# Patient Record
Sex: Female | Born: 1955
Health system: Southern US, Community
[De-identification: ages and names within clinical notes are randomized; demographics above are authoritative.]

## PROBLEM LIST (undated history)

## (undated) DIAGNOSIS — Z8719 Personal history of other diseases of the digestive system: Secondary | ICD-10-CM

## (undated) DIAGNOSIS — M8580 Other specified disorders of bone density and structure, unspecified site: Secondary | ICD-10-CM

## (undated) DIAGNOSIS — N200 Calculus of kidney: Secondary | ICD-10-CM

## (undated) DIAGNOSIS — R9089 Other abnormal findings on diagnostic imaging of central nervous system: Secondary | ICD-10-CM

## (undated) DIAGNOSIS — R599 Enlarged lymph nodes, unspecified: Secondary | ICD-10-CM

## (undated) DIAGNOSIS — R413 Other amnesia: Principal | ICD-10-CM

## (undated) DIAGNOSIS — S5292XA Unspecified fracture of left forearm, initial encounter for closed fracture: Secondary | ICD-10-CM

## (undated) DIAGNOSIS — K219 Gastro-esophageal reflux disease without esophagitis: Secondary | ICD-10-CM

## (undated) DIAGNOSIS — E785 Hyperlipidemia, unspecified: Secondary | ICD-10-CM

## (undated) DIAGNOSIS — K589 Irritable bowel syndrome without diarrhea: Secondary | ICD-10-CM

## (undated) DIAGNOSIS — T7840XA Allergy, unspecified, initial encounter: Secondary | ICD-10-CM

## (undated) DIAGNOSIS — B009 Herpesviral infection, unspecified: Secondary | ICD-10-CM

## (undated) DIAGNOSIS — N159 Renal tubulo-interstitial disease, unspecified: Secondary | ICD-10-CM

## (undated) DIAGNOSIS — Z8669 Personal history of other diseases of the nervous system and sense organs: Secondary | ICD-10-CM

## (undated) DIAGNOSIS — G4730 Sleep apnea, unspecified: Secondary | ICD-10-CM

## (undated) DIAGNOSIS — G8929 Other chronic pain: Secondary | ICD-10-CM

## (undated) DIAGNOSIS — D509 Iron deficiency anemia, unspecified: Secondary | ICD-10-CM

## (undated) DIAGNOSIS — M797 Fibromyalgia: Secondary | ICD-10-CM

## (undated) DIAGNOSIS — M549 Dorsalgia, unspecified: Secondary | ICD-10-CM

## (undated) DIAGNOSIS — I2510 Atherosclerotic heart disease of native coronary artery without angina pectoris: Secondary | ICD-10-CM

## (undated) DIAGNOSIS — A498 Other bacterial infections of unspecified site: Secondary | ICD-10-CM

## (undated) DIAGNOSIS — D126 Benign neoplasm of colon, unspecified: Secondary | ICD-10-CM

## (undated) DIAGNOSIS — M5481 Occipital neuralgia: Secondary | ICD-10-CM

## (undated) DIAGNOSIS — K5790 Diverticulosis of intestine, part unspecified, without perforation or abscess without bleeding: Secondary | ICD-10-CM

## (undated) HISTORY — PX: CYSTECTOMY: SHX5119

## (undated) HISTORY — PX: DILATION AND CURETTAGE OF UTERUS: SHX78

## (undated) HISTORY — PX: OTHER SURGICAL HISTORY: SHX169

## (undated) HISTORY — DX: Other specified disorders of bone density and structure, unspecified site: M85.80

## (undated) HISTORY — DX: Hyperlipidemia, unspecified: E78.5

## (undated) HISTORY — DX: Irritable bowel syndrome, unspecified: K58.9

## (undated) HISTORY — DX: Other bacterial infections of unspecified site: A49.8

## (undated) HISTORY — DX: Enlarged lymph nodes, unspecified: R59.9

## (undated) HISTORY — DX: Personal history of other diseases of the nervous system and sense organs: Z86.69

## (undated) HISTORY — PX: CHOLECYSTECTOMY: SHX55

## (undated) HISTORY — DX: Personal history of other diseases of the digestive system: Z87.19

## (undated) HISTORY — PX: COLONOSCOPY: SHX174

## (undated) HISTORY — DX: Benign neoplasm of colon, unspecified: D12.6

## (undated) HISTORY — DX: Other abnormal findings on diagnostic imaging of central nervous system: R90.89

## (undated) HISTORY — DX: Diverticulosis of intestine, part unspecified, without perforation or abscess without bleeding: K57.90

## (undated) HISTORY — DX: Atherosclerotic heart disease of native coronary artery without angina pectoris: I25.10

## (undated) HISTORY — DX: Sleep apnea, unspecified: G47.30

## (undated) HISTORY — DX: Allergy, unspecified, initial encounter: T78.40XA

## (undated) HISTORY — PX: POLYPECTOMY: SHX149

## (undated) HISTORY — DX: Iron deficiency anemia, unspecified: D50.9

## (undated) HISTORY — DX: Gastro-esophageal reflux disease without esophagitis: K21.9

## (undated) HISTORY — DX: Unspecified fracture of left forearm, initial encounter for closed fracture: S52.92XA

## (undated) HISTORY — PX: TONSILLECTOMY: SUR1361

## (undated) HISTORY — PX: SHOULDER SURGERY: SHX246

## (undated) HISTORY — DX: Other amnesia: R41.3

---

## 1998-06-29 ENCOUNTER — Ambulatory Visit (HOSPITAL_COMMUNITY): Admission: RE | Admit: 1998-06-29 | Discharge: 1998-06-30 | Payer: Self-pay | Admitting: Surgery

## 1998-06-29 ENCOUNTER — Encounter: Payer: Self-pay | Admitting: Surgery

## 1999-04-27 ENCOUNTER — Other Ambulatory Visit: Admission: RE | Admit: 1999-04-27 | Discharge: 1999-04-27 | Payer: Self-pay | Admitting: Gynecology

## 1999-04-27 ENCOUNTER — Encounter (INDEPENDENT_AMBULATORY_CARE_PROVIDER_SITE_OTHER): Payer: Self-pay | Admitting: Specialist

## 2001-06-16 ENCOUNTER — Other Ambulatory Visit: Admission: RE | Admit: 2001-06-16 | Discharge: 2001-06-16 | Payer: Self-pay | Admitting: Gynecology

## 2004-05-20 DIAGNOSIS — D126 Benign neoplasm of colon, unspecified: Secondary | ICD-10-CM

## 2004-05-20 HISTORY — DX: Benign neoplasm of colon, unspecified: D12.6

## 2006-08-17 ENCOUNTER — Observation Stay (HOSPITAL_COMMUNITY): Admission: EM | Admit: 2006-08-17 | Discharge: 2006-08-18 | Payer: Self-pay | Admitting: Emergency Medicine

## 2006-08-17 ENCOUNTER — Ambulatory Visit: Payer: Self-pay | Admitting: Cardiology

## 2006-08-29 ENCOUNTER — Ambulatory Visit: Payer: Self-pay

## 2006-08-29 ENCOUNTER — Encounter: Payer: Self-pay | Admitting: Internal Medicine

## 2006-09-22 ENCOUNTER — Ambulatory Visit: Payer: Self-pay | Admitting: Cardiology

## 2007-02-26 ENCOUNTER — Emergency Department (HOSPITAL_COMMUNITY): Admission: EM | Admit: 2007-02-26 | Discharge: 2007-02-26 | Payer: Self-pay | Admitting: Emergency Medicine

## 2010-10-05 NOTE — H&P (Signed)
Amanda Waters, Amanda Waters NO.:  [id number]   MEDICAL RECORD NO.:  [id number]          PATIENT TYPE:  OBV   LOCATION:  1833                         FACILITY:  [facility]   PHYSICIAN:  Rollene Rotunda, MD, FACCDATE OF BIRTH:  [birth year]   DATE OF ADMISSION:  08/17/2006  DATE OF DISCHARGE:                              HISTORY & PHYSICAL   CURRENT MEDICATIONS:  1. Ambien 10 mg 1/2 tablet q.h.s.  2. Famciclovir 5 mg daily.  3. Lidoderm patch to her back p.r.n.  4. Cyclobenzaprine p.r.n.  5. Nexium 40 mg daily p.r.n.  6. Zyrtec-D q. p.r.n.  7. Gabapentin 600 mg tablets 1-1/2 tablet to 8:00 a.m., 1 tablet at      2:00 p.m. 1 tablet 7:00 p.m. and 1 tablet at 11:00 p.m.  8. Hydrocodone and Advair Liqui-gels which she takes p.r.n., but      rarely although she has recently taken the Liqui-gels, because she      was walking around at a trade show recently.   SOCIAL HISTORY:  She lives in [place] with her husband and is self-  employed as a Network engineer.  She has no history of alcohol, tobacco  or drug abuse.   FAMILY HISTORY:  Her mother is alive at age 4 without a history of  coronary artery disease.  Her father is alive at age 104 with a history  of CVA and coronary artery disease, and she thinks he was diagnosed with  coronary artery disease at approximately age 54.  She has no siblings  with coronary artery disease.   REVIEW OF SYSTEMS:  She had a slight amount of diaphoresis with chest  pain yesterday but none today and rarely gets night sweats.  She has  vision loss and wears glasses.  The chest pain is described above.  She  denies symptoms of depression or anxiety.  She has chronic pain issues  in her back and in the occipital region of her head.  She has occasional  numbness in the outer aspect of her left arm and last two fingers.  Her  reflux symptoms were worse, and she was taking Nexium daily, but then  they improved once she took less Advil, and now she  does not take Nexium  on a daily basis.  She has alternating constipation and diarrhea.  Review of systems is otherwise negative.   PHYSICAL EXAM:  VITAL SIGNS: Temperature is 97.8, blood pressure  initially 155/89, now 122/68, pulse 73, respiratory rate 20, O2  saturation 100% on room air.  GENERAL:  She is a well-developed, well-nourished white female in no  acute distress.  HEENT:  Head is normocephalic and atraumatic with extraocular movements  intact.  Sclera clear.  Nares without discharge.  NECK:  There is no lymphadenopathy, thyromegaly, bruit or JVD noted.  CVA:  Heart is regular in rate and rhythm with an S1-S2 and distal  pulses, are equal in all four extremities.  No femoral bruits are  appreciated, and there is no significant murmur, rub or gallop noted on  heart sounds.  Her PMI is normal.  LUNGS:  Clear to auscultation bilaterally without wheezing or crackles.  SKIN:  No rashes or lesions are noted.  ABDOMEN:  She is tender in the mid epigastric area and in both lower  quadrants.  Abdomen is soft and she has active bowel sounds, and there  is no rebound or guarding.  She has no splenomegaly by palpation.  EXTREMITIES:  There is no cyanosis, clubbing or edema.  MUSCULOSKELETAL:  There is no joint deformity or effusions and no spine  or CVA tenderness.  NEURO:  She is alert and oriented.  Cranial nerves II-XII grossly  intact.  CHEST X-RAY:  There is no acute disease.  EKG:  Sinus rhythm rate 72  with no acute ischemic changes and no old is available for comparison.   LABORATORY VALUES:  Hemoglobin 14.6, hematocrit 43, sodium 140,  potassium 3.9, chloride 107, BUN 10, creatinine 1.0, glucose 91, other  CMET values within normal limits, D-dimer less than 0.22.  Point care  markers negative x one.   IMPRESSION:  1. Chest pain.  She has some typical but mostly atypical      characteristics.  The substernal pain is described as a heaviness,      and she has left arm  numbness, but on further evaluation this is      more consistent with neurologic symptoms.  She has had constant      chest pain for greater than 24 hours with no objective evidence for      ischemia.  We will therefore admit her to the hospital overnight      for observation and rule out MI.  If her cardiac enzymes are      negative, she will be scheduled for an outpatient exercise Myoview      in our office.  We will continue to use morphine for pain on a      p.r.n. basis, but she have hydrocodone at home that she can take as      an outpatient, should she continue to need anything.      Theodore Demark, PA-C      Rollene Rotunda, MD, [facility]  Electronically Signed    RB/MEDQ  D:  08/17/2006  T:  08/17/2006  Job:  [id number]

## 2010-10-05 NOTE — Discharge Summary (Signed)
NAME:  Amanda Waters, Amanda Waters NO.:  [id number]   MEDICAL RECORD NO.:  [id number]          PATIENT TYPE:  OBV   LOCATION:  3710                         FACILITY:  [facility]   PHYSICIAN:  Bevelyn Buckles. Bensimhon, MDDATE OF BIRTH:  Apr 15, 1956   DATE OF ADMISSION:  08/17/2006  DATE OF DISCHARGE:  08/18/2006                               DISCHARGE SUMMARY   PRIMARY CARE PHYSICIAN:  Jenne Campus, M.D., [facility].   CARDIOLOGIST:  Rollene Rotunda, MD, [facility]   DISCHARGE DIAGNOSES:  1. Chest pain, negative cardiac workup this admission by cardiac      markers.  CT of the chest negative for pulmonary embolism.  2. Abdominal pain, pending final results of abdominal ultrasound. The      patient with lipase, amylase within normal limits.  Hepatic      functions unremarkable except for indirect bilirubin mildly      elevated at 1.0.   PAST MEDICAL HISTORY:  1. Mild borderline diabetes managed by diet.  2. Hyperlipidemia managed by diet.  3. Perimenopausal  4. Herpes simplex 1 and 2.  5. Fibromyalgia  6. History of occipital nerve damage requiring medication, supposedly      obtained through a massage.  7. History of GERD and irritable bowel syndrome.  8. 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  1. Right elbow repair.  2. Cholecystectomy.  3. Removal of a fatty tumor from her left shoulder.  4. Colonoscopy was EGD  5. Allergies to DARVOCET, CELEBREX, PREDNISONE.   HOSPITAL COURSE:  Amanda Waters is a 55 year old Caucasian female with no a  history of coronary artery disease.  Apparently was driving yesterday  and developed chest pressure.  She described as substernal rating at a 7  on a scale of 1-10.  It was associated with nausea, malaise, and slight  diaphoresis as well as dizziness.  The patient denies shortness of  breath. Apparently also experienced some abdominal cramping with  diarrhea. The patient eventually returned  home, continued to feel bad,  took Nexium, became nauseated, continued to feel bad, took Ambien for  sleep, eventually fell asleep. On the morning of admission, symptoms  continued.  She came to the emergency room for further evaluation. In  the ER she was given baby aspirin and developed epigastric symptoms.  She then received morphine and GI cocktail which eventually relieved her  chest discomfort.   On physical exam, blood pressure 155/89, dropped to 122/68 after pain  relieved. Heart rate 73.  The patient saturating 100% on room air. Chest  x-ray showed no acute disease.  EKG:  Sinus rhythm at a rate of 72  without acute ST or T wave changes. Baseline lab work within normal  limits.  Point-of-care cardiac markers negative. The patient's chest  pain was felt to be mostly atypical. She was admitted for observation.  Dr. Gala Romney in to see the patient on day of discharge. The patient  complained of abdominal discomfort; however, amylase, lipase, LFTs  essentially negative. The patient states she has a gastroenterologist in  [place]. We will plan on doing abdominal ultrasound if ultrasound of  abdomen is negative. The patient to be discharged home to follow up with  her gastroenterologist in [facility]. The patient to schedule that  appointment. Also to follow up with Dr. Antoine Poche.  Arranged for her to  have a stress test exercise Myoview (April 11).  Will have the patient  proceed with 2-D echocardiogram first on that same day and then the  stress Myoview and then follow up with Dr. Antoine Poche on April 16.  I  believe the appointment has already been arranged. The patient  instructed to continue her home medications including Famvir, Neurontin,  hydrocodone that she was previously taking, Ambien for sleep which she  was previously on, laxative which she was previously on, aspirin 81 mg  daily and her Protonix which she takes 80 mg b.i.d. At time of  discharge, patient afebrile, pulse  88, respirations 18, blood pressure  108/73, saturating 99% on room air.  Weight 158 pounds. Ultrasound of  the abdomen showing status post cholecystectomy, otherwise no acute  findings.   Duration of discharge encounter:  45 minutes.      Dorian Pod, ACNP      Bevelyn Buckles. Bensimhon, MD  Electronically Signed    MB/MEDQ  D:  08/18/2006  T:  08/18/2006  Job:  [id number]   cc:   Jenne Campus, M.D., [facility]

## 2010-10-05 NOTE — H&P (Signed)
NAME:  Amanda Waters, Amanda Waters NO.:  [id number]   MEDICAL RECORD NO.:  [id number]          PATIENT TYPE:  EMS   LOCATION:  MAJO                         FACILITY:  [facility]   PHYSICIAN:  Rollene Rotunda, MD, FACCDATE OF BIRTH:  06/15/1955   DATE OF ADMISSION:  08/17/2006  DATE OF DISCHARGE:                              HISTORY & PHYSICAL   PRIMARY CARE PHYSICIAN:  Dr. Jenne Campus, [place].   CARDIOLOGIST:  New and will be Rollene Rotunda, MD, [facility].   CHIEF COMPLAINT:  Chest pain.   HISTORY OF PRESENT ILLNESS:  Amanda Waters is a 55 year old white female  with no history of coronary artery disease.  She was driving yesterday  and developed chest pressure at approximately 1:30.  It was substernal  and reached a 7/10.  It was associated with nausea, general malaise, and  slight diaphoresis as well as a little dizziness.  She had no shortness  of breath or nausea.  She has never had these symptoms before.  She  continued driving but stopped for a while, and while she stopped for  about an hour, she had two episodes of abdominal cramping one episode of  diarrhea.  Additionally, she was having associated numbness to the outer  aspect of her left arm and her fourth and fifth fingers.  She has had  that sometimes upon waking.   Last night after she reached home, her symptoms had not resolved, so she  took a Nexium.  She felt nauseated and did not feel that she could eat  anything without vomiting, so she did not.  She did not try any other  medications.  She took Ambien as usual for sleep, and about 2 hours  after taking the Nexium and the Ambien, she did sleep.  This morning her  symptoms had not resolved, and she came to the emergency room.  She  received four baby aspirin and developed epigastric symptoms.  She got  morphine and a GI cocktail and then the second dose of morphine.  After  the second dose of morphine, her chest pain eased and is currently a 3  or 4/10.  She is not  sure which medication helped her more but feels  that the more morphine helped the numbness in her left arm more.   PAST MEDICAL HISTORY:  She was told by her family physician and she was  prediabetic and is trying to limit sugars in her diet.  She denies any  history of hypertension but states that she does have a history of  hyperlipidemia which she is also trying to control with diet.  She has a  family history of coronary artery disease in her father.  She had an  episode of chest pain that was different from the chest pain she had  yesterday and today in approximately 2000, and for that she had an  exercise treadmill stress test in [facility] which she reports was okay.  She carries a diagnosis of fibromyalgia.  She is perimenopausal.  She  has herpes simplex 1 and 2.  Because of fibromyalgia, she has  gotten  massages in the past and, because of the massages, she has gotten  occipital nerve damage for which she takes medication.  She has a  history of gastroesophageal reflux disease and irritable bowel syndrome.  She has chronic back pain after coccygeal fracture x2.  She also states  that she has osteoarthritis.   SURGICAL HISTORY:  She is status post ovarian cyst removal as well as  right elbow repair after a fall with fractures as well as  cholecystectomy, removal of a fatty tumor from her left shoulder, and  colonoscopy with EGD.   ALLERGIES:  She is allergic or intolerant to DARVOCET, CELEBREX DIRECT  and PREDNISONE.   DICTATION ENDS HERE.      Theodore Demark, PA-C      Rollene Rotunda, MD, [facility]  Electronically Signed    RB/MEDQ  D:  08/17/2006  T:  08/17/2006  Job:  [id number]   cc:   Dr. Jenne Campus, [facility]

## 2010-10-05 NOTE — Assessment & Plan Note (Signed)
[facility]                            CARDIOLOGY OFFICE NOTE   NAME:Amanda Waters, Amanda Waters                      MRN:          [id number]  DATE:09/22/2006                            DOB:          May 07, 1956    PRIMARY CARE PHYSICIAN:  Dr. Fredia Beets, [place].   REASON FOR PRESENTATION:  The patient with chest pain.   HISTORY OF PRESENT ILLNESS:  The patient was hospitalized on August 17, 2006 with chest discomfort.  She ruled out for myocardial infarction.  She had a negative abdominal ultrasound at that time as well.  Following  discharge, she had a stress perfusion study in our office which  demonstrated an EF of 68% with normal wall motion.  She had an  echocardiogram which demonstrated an EF of 70-75% with no valvular  abnormalities or wall motion abnormalities.  Since that time, she has  had no further chest pressure such as that which caused her  hospitalization.  She has had some tingling in her hand which was one of  her complaints on admission, too.  She notices this in her fourth and  fifth fingers on her left hand.  She has had no neck discomfort.  She  has had no new shortness of breath.  She denies any PND or orthopnea.  She has had no palpitations, presyncope, or syncope.  She gets a little  dizzy, particularly when she is climbing a flight of stairs or deep  breathing.   She did have her lipid measured, and she had a total of triglycerides  246, total cholesterol 238, LDL 152, and an HDL of 47.   PAST MEDICAL HISTORY:  1. Borderline hyperglycemia.  2. Dyslipidemia.  3. Herpes simplex 1 and 2.  4. Fibromyalgia.  5. Occipital nerve damage.  6. Gastroesophageal reflux disease.  7. Irritable bowel syndrome.  8. 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  9. Osteoarthritis.  10.Ovarian cyst resected.  11.Right elbow repair.  12.Cholecystectomy.  13.Fatty tumor removed from the left shoulder.   ALLERGIES:  DARVOCET, CELEBREX,  PREDNISONE.   MEDICATIONS:  1. Neurontin 2400 mg daily.  2. Famciclovir daily.  3. Lidoderm patch.  4. Zyrtec.  5. DHEA 5 mg daily.  6. Vitamin D.  7. Nexium.   REVIEW OF SYSTEMS:  As stated in the HPI, otherwise negative for other  systems.   PHYSICAL EXAMINATION:  GENERAL:  The patient is in no distress.  VITAL SIGNS:  Blood pressure 110/78, heart rate 86 and regular.  HEENT:  Eyes are normal.  Pupils equal, round, and reactive to light.  Fundi not visualized.  Oral mucosa unremarkable.  NECK:  No jugular venous distention at 45 degrees, carotid upstroke  brisk and symmetric, no bruits, no thyromegaly.  LYMPHATICS:  No cervical, maxillary, or inguinal adenopathy.  LUNGS:  Clear to auscultation bilaterally.  BACK:  No costovertebral angle tenderness.  CHEST:  Unremarkable.  HEART:  PMI not displaced or sustained.  S1 and S2 within normal limits.  No S3, no S4, no clicks, rubs, murmurs.  ABDOMEN:  Flat, positive bowel sounds, normal  in frequency and pitch.  No bruits, rebound, guarding.  No midline pulse, no mass, no  hepatomegaly or splenomegaly.  SKIN:  No rashes, no nodules.  EXTREMITIES:  2+ pulses throughout, no edema, cyanosis, clubbing.  NEUROLOGIC:  Oriented to person, place, and time.  Cranial nerves 2-12  grossly intact.  Motor grossly intact.   EKG:  Sinus rhythm, rate 86.  Axis within normal limits.  Intervals  within normal limits.  No acute ST-T wave changes.   ASSESSMENT/PLAN:  1. Chest discomfort.  The patient's chest discomfort has not recurred.      She had a negative workup including echocardiogram and Cardiolite.      At this point, there is no evidence of cardiac etiology.  No      further cardiovascular testing is suggested.  2. Dizziness.  The patient has some dizziness.  There seems to be with      deep breathing or activity and may be related to hyperventilation.      She does not have any classic orthostatic symptoms.  She has not      had any  presyncope or syncope.  No further cardiac workup is      suggested.  3. Dyslipidemia.  I did counsel her on diet and recommended the [facility] approach as she has significant dyslipidemia.  I would      suggest that if she can follow a diet and has this repeated and she      still is not improved, that I would have a low threshold for a      statin.  She will discuss this with her primary care doctor.  4. Arm discomfort.  She will discuss this with her fibromyalgia      doctor.  She also sees a      headache/pain specialist.  5. Followup will be in this clinic as needed.     Rollene Rotunda, MD, [facility]     JH/MedQ  DD: 09/22/2006  DT: 09/22/2006  Job #: [id number]   cc:   Ronny Flurry, MD                             [facility]                            CARDIOLOGY OFFICE NOTE   NAME:Amanda Waters, Amanda Waters                      MRN:          [id number]  DATE:09/22/2006                            DOB:          August 30, 1955    PRIMARY CARE PHYSICIAN:  Dr. Fredia Beets, [place].   REASON FOR PRESENTATION:  The patient with chest pain.   HISTORY OF PRESENT ILLNESS:  The patient was hospitalized on August 17, 2006 with chest discomfort.  She ruled out for myocardial infarction.  She had a negative abdominal ultrasound at that time as well.  Following  discharge, she had a stress perfusion study in our office which  demonstrated an EF of 68% with normal wall motion.  She had an  echocardiogram which demonstrated an EF of 70-75% with no  valvular  abnormalities or wall motion abnormalities.  Since that time, she has  had no further chest pressure such as that which caused her  hospitalization.  She has had some tingling in her hand which was one of  her complaints on admission, too.  She notices this in her fourth and  fifth fingers on her left hand.  She has had no neck discomfort.  She  has had no new shortness of breath.  She denies any PND or orthopnea.  She has had no  palpitations, presyncope, or syncope.  She gets a little  dizzy, particularly when she is climbing a flight of stairs or deep  breathing.   She did have her lipid measured, and she had a total of triglycerides  246, total cholesterol 238, LDL 152, and an HDL of 47.   PAST MEDICAL HISTORY:  1. Borderline hyperglycemia.  2. Dyslipidemia.  3. Herpes simplex 1 and 2.  4. Fibromyalgia.  5. Occipital nerve damage.  6. Gastroesophageal reflux disease.  7. Irritable bowel syndrome.  8. 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  9. Osteoarthritis.  10.Ovarian cyst resected.  11.Right elbow repair.  12.Cholecystectomy.  13.Fatty tumor removed from the left shoulder.   ALLERGIES:  DARVOCET, CELEBREX, PREDNISONE.   MEDICATIONS:  1. Neurontin 2400 mg daily.  2. Famciclovir daily.  3. Lidoderm patch.  4. Zyrtec.  5. DHEA 5 mg daily.  6. Vitamin D.  7. Nexium.   REVIEW OF SYSTEMS:  As stated in the HPI, otherwise negative for other  systems.

## 2011-02-28 LAB — URINALYSIS, ROUTINE W REFLEX MICROSCOPIC
Bilirubin Urine: NEGATIVE
Nitrite: NEGATIVE
Specific Gravity, Urine: 1.008
Urobilinogen, UA: 0.2
pH: 6

## 2011-02-28 LAB — I-STAT 8, (EC8 V) (CONVERTED LAB)
Bicarbonate: 25.4 — ABNORMAL HIGH
Chloride: 107
HCT: 41
Hemoglobin: 13.9
Operator id: 198171
TCO2: 27
pCO2, Ven: 43.2 — ABNORMAL LOW
pH, Ven: 7.377 — ABNORMAL HIGH

## 2011-02-28 LAB — POCT I-STAT CREATININE
Creatinine, Ser: 0.9
Operator id: 198171

## 2011-11-24 ENCOUNTER — Encounter (HOSPITAL_BASED_OUTPATIENT_CLINIC_OR_DEPARTMENT_OTHER): Payer: Self-pay | Admitting: Emergency Medicine

## 2011-11-24 ENCOUNTER — Emergency Department (HOSPITAL_BASED_OUTPATIENT_CLINIC_OR_DEPARTMENT_OTHER)
Admission: EM | Admit: 2011-11-24 | Discharge: 2011-11-24 | Disposition: A | Discharge status: Home / Self Care | Payer: BC Managed Care – PPO | Attending: Emergency Medicine | Admitting: Emergency Medicine

## 2011-11-24 DIAGNOSIS — L03012 Cellulitis of left finger: Secondary | ICD-10-CM

## 2011-11-24 DIAGNOSIS — Z87442 Personal history of urinary calculi: Secondary | ICD-10-CM | POA: Insufficient documentation

## 2011-11-24 DIAGNOSIS — Z79899 Other long term (current) drug therapy: Secondary | ICD-10-CM | POA: Insufficient documentation

## 2011-11-24 DIAGNOSIS — L03019 Cellulitis of unspecified finger: Secondary | ICD-10-CM | POA: Insufficient documentation

## 2011-11-24 DIAGNOSIS — IMO0001 Reserved for inherently not codable concepts without codable children: Secondary | ICD-10-CM | POA: Insufficient documentation

## 2011-11-24 HISTORY — DX: Calculus of kidney: N20.0

## 2011-11-24 HISTORY — DX: Occipital neuralgia: M54.81

## 2011-11-24 HISTORY — DX: Dorsalgia, unspecified: M54.9

## 2011-11-24 HISTORY — DX: Other chronic pain: G89.29

## 2011-11-24 HISTORY — DX: Herpesviral infection, unspecified: B00.9

## 2011-11-24 HISTORY — DX: Renal tubulo-interstitial disease, unspecified: N15.9

## 2011-11-24 HISTORY — DX: Fibromyalgia: M79.7

## 2011-11-24 MED ORDER — HYDROCODONE-ACETAMINOPHEN 5-500 MG PO TABS: 1.0000 | ORAL_TABLET | ORAL | Status: AC | PRN

## 2011-11-24 MED ORDER — ONDANSETRON 4 MG PO TBDP
ORAL_TABLET | ORAL | Status: AC
  Administered 2011-11-24: 4 mg via ORAL
  Filled 2011-11-24: qty 1

## 2011-11-24 MED ORDER — LIDOCAINE HCL 2 % IJ SOLN
INTRAMUSCULAR | Status: AC
  Filled 2011-11-24: qty 1

## 2011-11-24 MED ORDER — ONDANSETRON 4 MG PO TBDP: 4.0000 mg | ORAL_TABLET | Freq: Three times a day (TID) | ORAL | Status: AC | PRN

## 2011-11-24 MED ORDER — ONDANSETRON 4 MG PO TBDP
4.0000 mg | ORAL_TABLET | Freq: Once | ORAL | Status: AC
  Administered 2011-11-24: 4 mg via ORAL

## 2011-11-24 MED ORDER — ONDANSETRON 4 MG PO TBDP
4.0000 mg | ORAL_TABLET | Freq: Once | ORAL | Status: AC
  Administered 2011-11-24: 4 mg via ORAL
  Filled 2011-11-24: qty 1

## 2011-11-24 MED ORDER — CLINDAMYCIN HCL 300 MG PO CAPS: 300.0000 mg | ORAL_CAPSULE | Freq: Four times a day (QID) | ORAL | Status: AC

## 2011-11-24 NOTE — ED Provider Notes (Signed)
aHistory     CSN: [id number]  Arrival date & time 11/24/11  5621   First MD Initiated Contact with Patient 11/24/11 [phone]      Chief Complaint  Patient presents with  . Nail Problem    (Consider location/radiation/quality/duration/timing/severity/associated sxs/prior treatment) HPI  Left thumb pain x 2 days ago. Noticed it while cooking. Worsening now with dec ROM of thumb. No radiation, denies hand pain. Denies fever/chills. Has taken 1/2 hydrocodone today with min relief. Saw pus drainage yesterday from corner of thumb. No h/o similar. Denies fever/chills. No h/o DM  Past Medical History  Diagnosis Date  . Fibromyalgia   . Occipital neuralgia   . Chronic back pain   . HSV-1 (herpes simplex virus 1) infection   . HSV-2 (herpes simplex virus 2) infection   . Kidney infection   . Kidney stones     Past Surgical History  Procedure Date  . Tonsillectomy   . Cystectomy   . Cholecystectomy   . Dilation and curettage of uterus     No family history on file.  History  Substance Use Topics  . Smoking status: Not on file  . Smokeless tobacco: Not on file  . Alcohol Use:     OB History    Grav Para Term Preterm Abortions TAB SAB Ect Mult Living                  Review of Systems except as noted HPI   Allergies  Celebrex; Darvon; Duract; Oxycodone; and Prednisone  Home Medications   Current Outpatient Rx  Name Route Sig Dispense Refill  . CYCLOBENZAPRINE HCL 10 MG PO TABS Oral Take 10 mg by mouth 3 (three) times daily as needed.    Marland Kitchen DICLOFENAC EPOLAMINE 1.3 % TD PTCH Transdermal Place 1 patch onto the skin daily as needed.    Marland Kitchen FAMCICLOVIR 500 MG PO TABS Oral Take 500 mg by mouth daily.    Marland Kitchen GABAPENTIN 600 MG PO TABS Oral Take 600 mg by mouth 3 (three) times daily.    Marland Kitchen LORATADINE 10 MG PO TABS Oral Take 10 mg by mouth daily.    Marland Kitchen NAPROXEN SODIUM 220 MG PO TABS Oral Take 220 mg by mouth 2 (two) times daily with a meal.    . PANTOPRAZOLE SODIUM 40 MG PO TBEC  Oral Take 40 mg by mouth daily.    Marland Kitchen ZOLPIDEM TARTRATE 10 MG PO TABS Oral Take 10 mg by mouth at bedtime as needed.    Marland Kitchen CLINDAMYCIN HCL 300 MG PO CAPS Oral Take 1 capsule (300 mg total) by mouth every 6 (six) hours. 30 capsule 0  . HYDROCODONE-ACETAMINOPHEN 5-500 MG PO TABS Oral Take 1 tablet by mouth every 4 (four) hours as needed for pain. 10 tablet 0  . ONDANSETRON 4 MG PO TBDP Oral Take 1 tablet (4 mg total) by mouth every 8 (eight) hours as needed for nausea. 10 tablet 0    BP 138/97  Temp 97.9 F (36.6 C) (Oral)  Resp 16  SpO2 100%  Physical Exam  Nursing note and vitals reviewed. Constitutional: She is oriented to person, place, and time. She appears well-developed.  HENT:  Head: Atraumatic.  Mouth/Throat: Oropharynx is clear and moist.  Eyes: Conjunctivae and EOM are normal. Pupils are equal, round, and reactive to light.  Neck: Normal range of motion. Neck supple.  Cardiovascular: Normal rate, regular rhythm, normal heart sounds and intact distal pulses.   Pulmonary/Chest: Effort normal and breath sounds  normal. No respiratory distress. She has no wheezes. She has no rales.  Abdominal: Soft. She exhibits no distension. There is no tenderness. There is no rebound and no guarding.  Musculoskeletal: Normal range of motion.       Hands:      No pain along flexor tendon sheath No symmetric swelling throughout (distal aspect only) Min pain with passive extension Not holding finger in flexion  Neurological: She is alert and oriented to person, place, and time.  Skin: Skin is warm and dry. No rash noted.  Psychiatric: She has a normal mood and affect.    ED Course  INCISION AND DRAINAGE Performed by: Forbes Cellar Authorized by: Forbes Cellar Consent: Verbal consent obtained. Consent given by: patient Patient understanding: patient states understanding of the procedure being performed Patient consent: the patient's understanding of the procedure matches consent  given Procedure consent: procedure consent matches procedure scheduled Patient identity confirmed: arm band Type: abscess Body area: upper extremity Location details: left thumb Anesthesia: digital block Local anesthetic: lidocaine 1% without epinephrine Anesthetic total: 3 ml Patient sedated: no Scalpel size: 11 Complexity: simple Drainage: purulent Drainage amount: scant Wound treatment: wound left open Patient tolerance: Patient tolerated the procedure well with no immediate complications. Comments: Partial removal of nail to allow for drainage   (including critical care time)  Labs Reviewed - No data to display No results found.  1. Paronychia of left thumb    MDM  Paronychia left thumb status post incision and drainage. Partial nail removal to allow for continued drainage. She does not have sequelae of flexor tenosynovitis at this time. The swelling in pad of finger is likely secondary to inflammation rather than felon. She's been given a prescription for clindamycin. She will followup with her primary care, urgent care or the emergency department in 2 days for recheck. She's been given strict precautions return. No emergency medical condition precluded discharge at this time.        Forbes Cellar, MD 11/26/11 [phone]

## 2011-11-24 NOTE — ED Notes (Addendum)
Pt has infection at left thumb, noted swelling and bruising.  Denies injury.  Pt took pain medication for pain this am, and now has nausea.  Pt states she has low pain tolerance.

## 2012-03-12 ENCOUNTER — Encounter: Payer: Self-pay | Admitting: Neurology

## 2012-04-07 DIAGNOSIS — R519 Headache, unspecified: Secondary | ICD-10-CM | POA: Insufficient documentation

## 2012-04-07 DIAGNOSIS — R51 Headache: Secondary | ICD-10-CM | POA: Insufficient documentation

## 2012-04-07 DIAGNOSIS — R9409 Abnormal results of other function studies of central nervous system: Secondary | ICD-10-CM | POA: Insufficient documentation

## 2012-04-07 DIAGNOSIS — R209 Unspecified disturbances of skin sensation: Secondary | ICD-10-CM | POA: Insufficient documentation

## 2012-08-04 ENCOUNTER — Telehealth: Payer: Self-pay | Admitting: *Deleted

## 2012-08-04 NOTE — Telephone Encounter (Signed)
Patient called stating  she think she needs a blood patch because she woke up this morning with pressure, headache, dizziness and pain.

## 2012-08-04 NOTE — Telephone Encounter (Signed)
I called patient. She has had some spinal headaches today. At the current moment, she feels better taking Excedrin Migraine. We will hold off on the blood patch, but if she needs to have this done, she will contact us. I discussed the results of the spinal fluid that was available. The oligoclonal banding and the blood work is not yet back.

## 2012-08-07 ENCOUNTER — Telehealth: Payer: Self-pay | Admitting: *Deleted

## 2012-08-07 DIAGNOSIS — R9089 Other abnormal findings on diagnostic imaging of central nervous system: Secondary | ICD-10-CM

## 2012-08-07 NOTE — Telephone Encounter (Signed)
Patient called wanting to get the results of her test.

## 2012-08-07 NOTE — Telephone Encounter (Signed)
I called the patient. The spinal fluid analysis is normal. There is no evidence of multiple sclerosis. The blood work for some reason has not yet come back. I will need to call the lab to see if this is available. We will get a transcranial Doppler bubble study to look for a PFO to explain the white matter changes in the brain.

## 2012-08-24 ENCOUNTER — Other Ambulatory Visit: Payer: Self-pay | Admitting: Neurology

## 2012-08-27 LAB — CARDIOLIPIN ANTIBODY
Anticardiolipin IgA: <9 APL U/mL (ref 0–11)
Anticardiolipin IgM: <9 MPL U/mL (ref 0–12)

## 2012-08-27 LAB — LYME, TOTAL AB TEST/REFLEX: Lyme Ab: <0.91 index (ref 0.00–0.90)

## 2012-08-27 LAB — LUPUS ANTICOAGULANT
Dilute Viper Venom Time: 45.8 s (ref 0.0–55.1)
dPT Confirm Ratio: 1.08 Ratio (ref 0.00–1.20)

## 2012-08-27 LAB — VITAMIN B12: Vitamin B-12: 1034 pg/mL — ABNORMAL HIGH (ref 211–946)

## 2012-08-27 LAB — TSH: TSH: 1.85 u[IU]/mL (ref 0.450–4.500)

## 2012-08-31 NOTE — Progress Notes (Signed)
Quick Note:  I called and left a message for the patient that her lab results were normal and that if she has any questions to callback to the office to speak with me Jasmine December). ______

## 2012-09-11 ENCOUNTER — Other Ambulatory Visit: Payer: Self-pay | Admitting: Obstetrics & Gynecology

## 2012-09-11 DIAGNOSIS — N951 Menopausal and female climacteric states: Secondary | ICD-10-CM

## 2012-09-22 ENCOUNTER — Ambulatory Visit
Admission: RE | Admit: 2012-09-22 | Discharge: 2012-09-22 | Disposition: A | Discharge status: Home / Self Care | Payer: BC Managed Care – PPO | Source: Ambulatory Visit | Attending: Obstetrics & Gynecology | Admitting: Obstetrics & Gynecology

## 2012-09-22 DIAGNOSIS — N951 Menopausal and female climacteric states: Secondary | ICD-10-CM

## 2012-10-24 ENCOUNTER — Other Ambulatory Visit: Payer: Self-pay | Admitting: Neurology

## 2012-10-27 ENCOUNTER — Other Ambulatory Visit: Payer: Self-pay

## 2012-10-27 MED ORDER — ZOLPIDEM TARTRATE 5 MG PO TABS: ORAL_TABLET | ORAL | Status: DC

## 2012-12-14 ENCOUNTER — Encounter: Payer: Self-pay | Admitting: Neurology

## 2012-12-14 DIAGNOSIS — R51 Headache: Secondary | ICD-10-CM

## 2012-12-14 DIAGNOSIS — R9409 Abnormal results of other function studies of central nervous system: Secondary | ICD-10-CM

## 2012-12-14 DIAGNOSIS — R209 Unspecified disturbances of skin sensation: Secondary | ICD-10-CM

## 2012-12-16 ENCOUNTER — Ambulatory Visit (INDEPENDENT_AMBULATORY_CARE_PROVIDER_SITE_OTHER): Payer: BC Managed Care – PPO | Admitting: Neurology

## 2012-12-16 ENCOUNTER — Encounter: Payer: Self-pay | Admitting: Neurology

## 2012-12-16 VITALS — BP 153/91 | HR 81 | Wt 140.0 lb

## 2012-12-16 DIAGNOSIS — M797 Fibromyalgia: Secondary | ICD-10-CM | POA: Insufficient documentation

## 2012-12-16 DIAGNOSIS — R209 Unspecified disturbances of skin sensation: Secondary | ICD-10-CM

## 2012-12-16 DIAGNOSIS — IMO0001 Reserved for inherently not codable concepts without codable children: Secondary | ICD-10-CM

## 2012-12-16 MED ORDER — GABAPENTIN 600 MG PO TABS: ORAL_TABLET | ORAL | Status: DC

## 2012-12-16 MED ORDER — AMITRIPTYLINE HCL 10 MG PO TABS: 10.0000 mg | ORAL_TABLET | Freq: Every day | ORAL | Status: DC

## 2012-12-16 NOTE — Progress Notes (Signed)
Reason for visit: Fibromyalgia  Amanda Waters is an 57 y.o. female  History of present illness:  [name] is a 57 year old right-handed white female with a history of fibromyalgia. The patient has undergone a demyelinating disease workup secondary to mild white matter changes by MRI the brain, and reports of memory issues and sensory changes. The patient had normal blood work and spinal fluid analysis. The patient indicates that since last seen, she is on a gluten-free diet, and she indicates that her pain level has markedly improved. The patient is on gabapentin as she thinks that this is helpful. The patient also takes low-dose amitriptyline at night, but she has not sure that she needs this medication anymore. The patient is exercising on a regular basis, and she has lost 15-20 pounds. The patient feels much better overall.   Past Medical History  Diagnosis Date  . Fibromyalgia   . Occipital neuralgia   . Chronic back pain   . HSV-1 (herpes simplex virus 1) infection   . HSV-2 (herpes simplex virus 2) infection   . Kidney infection   . Kidney stones   . Dyslipidemia   . GERD (gastroesophageal reflux disease)   . IBS (irritable bowel syndrome)   . History of carpal tunnel syndrome   . History of hemorrhoids   . Osteopenia   . Iron deficiency anemia     Past Surgical History  Procedure Laterality Date  . Tonsillectomy    . Cystectomy    . Cholecystectomy    . Dilation and curettage of uterus    . Shoulder surgery      Family History  Problem Relation Age of Onset  . Fibromyalgia Mother   . Diabetes Father   . Thyroid cancer Father   . Heart disease Father     Social history:  reports that she has never smoked. She does not have any smokeless tobacco history on file. She reports that  drinks alcohol. She reports that she does not use illicit drugs.  Allergies:  Allergies  Allergen Reactions  . Celebrex (Celecoxib)   . Darvon (Propoxyphene)   . Duract  (Bromfenac)   . Prednisone     Medications:  Current Outpatient Prescriptions on File Prior to Visit  Medication Sig Dispense Refill  . cyclobenzaprine (FLEXERIL) 10 MG tablet Take 10 mg by mouth 3 (three) times daily as needed.      . diclofenac (FLECTOR) 1.3 % PTCH Place 1 patch onto the skin daily as needed.      . famciclovir (FAMVIR) 500 MG tablet Take 500 mg by mouth daily.      Marland Kitchen loratadine (CLARITIN) 10 MG tablet Take 10 mg by mouth daily as needed.       . pantoprazole (PROTONIX) 40 MG tablet Take 40 mg by mouth daily.      Marland Kitchen zolpidem (AMBIEN) 5 MG tablet Take up to 3/4 tablet at night if neeed  30 tablet  5   No current facility-administered medications on file prior to visit.    ROS:  Out of a complete 14 system review of symptoms, the patient complains only of the following symptoms, and all other reviewed systems are negative.  Numbness   Blood pressure 153/91, pulse 81, weight 140 lb (63.504 kg).  Physical Exam  General: The patient is alert and cooperative at the time of the examination.  Skin: No significant peripheral edema is noted.   Neurologic Exam  Cranial nerves: Facial symmetry is present. Speech is  normal, no aphasia or dysarthria is noted. Extraocular movements are full. Visual fields are full.  Motor: The patient has good strength in all 4 extremities.  Coordination: The patient has good finger-nose-finger and heel-to-shin bilaterally.  Gait and station: The patient has a normal gait. Tandem gait is normal. Romberg is negative. No drift is seen.  Reflexes: Deep tendon reflexes are symmetric.   Assessment/Plan:  One. Fibromyalgia  2. Mild white matter changes, MRI brain  The patient overall is doing quite well at this point. The patient will try taper down off of amitriptyline. The gabapentin will be maintained. The patient will followup through this office in one year.   Marlan Palau MD 12/16/2012 8:19 PM  [facility] [address] [place], [place] [address]  Phone [phone] Fax [phone]

## 2013-02-13 ENCOUNTER — Encounter (HOSPITAL_BASED_OUTPATIENT_CLINIC_OR_DEPARTMENT_OTHER): Payer: Self-pay

## 2013-02-13 ENCOUNTER — Emergency Department (HOSPITAL_BASED_OUTPATIENT_CLINIC_OR_DEPARTMENT_OTHER): Payer: BC Managed Care – PPO

## 2013-02-13 ENCOUNTER — Emergency Department (HOSPITAL_BASED_OUTPATIENT_CLINIC_OR_DEPARTMENT_OTHER)
Admission: EM | Admit: 2013-02-13 | Discharge: 2013-02-13 | Disposition: A | Discharge status: Home / Self Care | Payer: BC Managed Care – PPO | Attending: Emergency Medicine | Admitting: Emergency Medicine

## 2013-02-13 DIAGNOSIS — Z8669 Personal history of other diseases of the nervous system and sense organs: Secondary | ICD-10-CM | POA: Insufficient documentation

## 2013-02-13 DIAGNOSIS — Y9389 Activity, other specified: Secondary | ICD-10-CM | POA: Insufficient documentation

## 2013-02-13 DIAGNOSIS — Z79899 Other long term (current) drug therapy: Secondary | ICD-10-CM | POA: Insufficient documentation

## 2013-02-13 DIAGNOSIS — Z8639 Personal history of other endocrine, nutritional and metabolic disease: Secondary | ICD-10-CM | POA: Insufficient documentation

## 2013-02-13 DIAGNOSIS — R296 Repeated falls: Secondary | ICD-10-CM | POA: Insufficient documentation

## 2013-02-13 DIAGNOSIS — Z8739 Personal history of other diseases of the musculoskeletal system and connective tissue: Secondary | ICD-10-CM | POA: Insufficient documentation

## 2013-02-13 DIAGNOSIS — K219 Gastro-esophageal reflux disease without esophagitis: Secondary | ICD-10-CM | POA: Insufficient documentation

## 2013-02-13 DIAGNOSIS — Z87442 Personal history of urinary calculi: Secondary | ICD-10-CM | POA: Insufficient documentation

## 2013-02-13 DIAGNOSIS — S8990XA Unspecified injury of unspecified lower leg, initial encounter: Secondary | ICD-10-CM | POA: Insufficient documentation

## 2013-02-13 DIAGNOSIS — S52123A Displaced fracture of head of unspecified radius, initial encounter for closed fracture: Secondary | ICD-10-CM | POA: Insufficient documentation

## 2013-02-13 DIAGNOSIS — S52122A Displaced fracture of head of left radius, initial encounter for closed fracture: Secondary | ICD-10-CM

## 2013-02-13 DIAGNOSIS — S0993XA Unspecified injury of face, initial encounter: Secondary | ICD-10-CM | POA: Insufficient documentation

## 2013-02-13 DIAGNOSIS — Z8619 Personal history of other infectious and parasitic diseases: Secondary | ICD-10-CM | POA: Insufficient documentation

## 2013-02-13 DIAGNOSIS — Z8679 Personal history of other diseases of the circulatory system: Secondary | ICD-10-CM | POA: Insufficient documentation

## 2013-02-13 DIAGNOSIS — Y929 Unspecified place or not applicable: Secondary | ICD-10-CM | POA: Insufficient documentation

## 2013-02-13 DIAGNOSIS — Z87448 Personal history of other diseases of urinary system: Secondary | ICD-10-CM | POA: Insufficient documentation

## 2013-02-13 DIAGNOSIS — Z862 Personal history of diseases of the blood and blood-forming organs and certain disorders involving the immune mechanism: Secondary | ICD-10-CM | POA: Insufficient documentation

## 2013-02-13 MED ORDER — ACETAMINOPHEN 325 MG PO TABS
650.0000 mg | ORAL_TABLET | Freq: Once | ORAL | Status: AC
  Administered 2013-02-13: 650 mg via ORAL
  Filled 2013-02-13: qty 2

## 2013-02-13 NOTE — ED Provider Notes (Signed)
CSN: [id number]     Arrival date & time 02/13/13  1657 History   First MD Initiated Contact with Patient 02/13/13 1722     Chief Complaint  Patient presents with  . Fall   (Consider location/radiation/quality/duration/timing/severity/associated sxs/prior Treatment) HPI Comments: Patient presents to the emergency department after a fall which occurred at approximately 4:00pm. Patient was helping pull in a jammed door when the door released causing her to fall. Patient fell in her left knee, outstretched left hand, and right face. She did not lose consciousness. Patient has mild pain in left knee and right face however has more severe pain in left elbow and trouble moving left elbow. She is able to move her left wrist with mild tenderness. No jaw pain or difficulty with vision or movement of her eyes. The treatments prior to arrival. Onset of symptoms acute. Course is constant. Movement makes the elbow pain worse. Patient has had fractured right radial head in the past and states that this pain is not as severe.  Patient is a 57 y.o. female presenting with fall. The history is provided by the patient.  Fall Associated symptoms include arthralgias. Pertinent negatives include no chest pain, fatigue, headaches, joint swelling, nausea, neck pain, numbness, vomiting or weakness.    Past Medical History  Diagnosis Date  . Fibromyalgia   . Occipital neuralgia   . Chronic back pain   . HSV-1 (herpes simplex virus 1) infection   . HSV-2 (herpes simplex virus 2) infection   . Kidney infection   . Kidney stones   . Dyslipidemia   . GERD (gastroesophageal reflux disease)   . IBS (irritable bowel syndrome)   . History of carpal tunnel syndrome   . History of hemorrhoids   . Osteopenia   . Iron deficiency anemia    Past Surgical History  Procedure Laterality Date  . Tonsillectomy    . Cystectomy    . Cholecystectomy    . Dilation and curettage of uterus    . Shoulder surgery     Family  History  Problem Relation Age of Onset  . Fibromyalgia Mother   . Diabetes Father   . Thyroid cancer Father   . Heart disease Father    History  Substance Use Topics  . Smoking status: Never Smoker   . Smokeless tobacco: Not on file  . Alcohol Use: Yes     Comment: Consumes alcohol on occasion   OB History   Grav Para Term Preterm Abortions TAB SAB Ect Mult Living                 Review of Systems  Constitutional: Negative for activity change and fatigue.  HENT: Negative for neck pain and tinnitus.   Eyes: Negative for photophobia, pain and visual disturbance.  Respiratory: Negative for shortness of breath.   Cardiovascular: Negative for chest pain.  Gastrointestinal: Negative for nausea and vomiting.  Musculoskeletal: Positive for arthralgias. Negative for back pain, joint swelling and gait problem.  Skin: Negative for wound.  Neurological: Negative for dizziness, weakness, light-headedness, numbness and headaches.  Psychiatric/Behavioral: Negative for confusion and decreased concentration.    Allergies  Celebrex; Darvon; Duract; and Prednisone  Home Medications   Current Outpatient Rx  Name  Route  Sig  Dispense  Refill  . amitriptyline (ELAVIL) 10 MG tablet   Oral   Take 1 tablet (10 mg total) by mouth at bedtime.   30 tablet   3   . Calcium Carbonate-Vitamin D (CALTRATE 600+D)  600-400 MG-UNIT per chew tablet   Oral   Chew 1 tablet by mouth daily.         . cyanocobalamin 1000 MCG tablet   Oral   Take 100 mcg by mouth daily.         . cyclobenzaprine (FLEXERIL) 10 MG tablet   Oral   Take 10 mg by mouth 3 (three) times daily as needed.         . diclofenac (FLECTOR) 1.3 % PTCH   Transdermal   Place 1 patch onto the skin daily as needed.         . famciclovir (FAMVIR) 500 MG tablet   Oral   Take 500 mg by mouth daily.         Marland Kitchen gabapentin (NEURONTIN) 600 MG tablet      One tablet twice a day, one half tablet at midday   75 tablet   6    . HYDROcodone-acetaminophen (NORCO/VICODIN) 5-325 MG per tablet   Oral   Take 1 tablet by mouth every 6 (six) hours as needed for pain.         Marland Kitchen loratadine (CLARITIN) 10 MG tablet   Oral   Take 10 mg by mouth daily as needed.          . pantoprazole (PROTONIX) 40 MG tablet   Oral   Take 40 mg by mouth daily.         . vitamin E 400 UNIT capsule   Oral   Take 400 Units by mouth daily.         Marland Kitchen zinc gluconate 50 MG tablet   Oral   Take 50 mg by mouth daily.         Marland Kitchen zolpidem (AMBIEN) 5 MG tablet      Take up to 3/4 tablet at night if neeed   30 tablet   5     Pharmacy Fax [phone]    BP 150/86  Pulse 78  Temp(Src) 98.2 F (36.8 C) (Oral)  Resp 18  Wt 135 lb (61.236 kg)  BMI 22.12 kg/m2  SpO2 100% Physical Exam  Nursing note and vitals reviewed. Constitutional: She is oriented to person, place, and time. She appears well-developed and well-nourished.  HENT:  Head: Normocephalic. Head is without raccoon's eyes and without Battle's sign.  Right Ear: Tympanic membrane, external ear and ear canal normal. No hemotympanum.  Left Ear: Tympanic membrane, external ear and ear canal normal. No hemotympanum.  Nose: Nose normal. No nasal septal hematoma.  Mouth/Throat: Uvula is midline, oropharynx is clear and moist and mucous membranes are normal.  Slight abrasion lateral to right orbit. No point tenderness or deformity.  Eyes: Conjunctivae, EOM and lids are normal. Pupils are equal, round, and reactive to light. Right eye exhibits no discharge. Left eye exhibits no discharge. Right eye exhibits no nystagmus. Left eye exhibits no nystagmus.  No visible hyphema noted. Full range of motion of eyes without pain or difficulty.  Neck: Normal range of motion. Neck supple.  Cardiovascular: Normal rate, regular rhythm and normal heart sounds.   Pulses:      Radial pulses are 2+ on the right side, and 2+ on the left side.  Pulmonary/Chest: Effort normal and breath sounds  normal.  Abdominal: Soft. There is no tenderness.  Musculoskeletal:       Left shoulder: Normal.       Left elbow: She exhibits decreased range of motion and swelling. Tenderness found. Radial head tenderness noted.  Left wrist: She exhibits tenderness. She exhibits normal range of motion and no bony tenderness.       Left hip: Normal.       Left knee: She exhibits normal range of motion, no swelling and no effusion. Tenderness found. No medial joint line, no lateral joint line and no patellar tendon tenderness noted.       Left ankle: Normal.       Cervical back: She exhibits normal range of motion, no tenderness and no bony tenderness.       Thoracic back: She exhibits no tenderness and no bony tenderness.       Lumbar back: She exhibits no tenderness and no bony tenderness.       Left upper arm: Normal.       Left forearm: Normal.       Left hand: Normal. Normal sensation noted. Normal strength noted.  Neurological: She is alert and oriented to person, place, and time. She has normal strength and normal reflexes. No cranial nerve deficit or sensory deficit. Coordination normal. GCS eye subscore is 4. GCS verbal subscore is 5. GCS motor subscore is 6.  Skin: Skin is warm and dry.  Psychiatric: She has a normal mood and affect.    ED Course  Procedures (including critical care time) Labs Review Labs Reviewed - No data to display Imaging Review No results found.  Patient seen and examined. X-ray ordered. Medications ordered.   Vital signs reviewed and are as follows: Filed Vitals:   02/13/13 1703  BP: 150/86  Pulse: 78  Temp: 98.2 F (36.8 C)  Resp: 18   X-ray reviewed and interpreted by myself. Patient discussed with Dr. Fredderick Phenix. Posterior splint by orthopedic technician. Patient informed of all results.  Patient has seen an orthopedist in [place] and she will followup with them for further evaluation of injury.  Patient has home pain medications.   MDM   1.  Radial head fracture, left, closed, initial encounter    Patient with radial head fracture. Upper extremity is neurovascularly intact. Do not suspect significant head injury or wrist injury. Patient has orthopedic followup. Elbow immobilized with sling and splint.   Renne Crigler, PA-C 02/13/13 1916

## 2013-02-13 NOTE — ED Notes (Addendum)
Patient reports that she fell in driveway after pushing on door, no loc. Complains of right sided facial pain and left arm,elbow and wrist pain. No obvious deformity to left arm, abrasion noted to right side of face

## 2013-02-14 NOTE — ED Provider Notes (Signed)
Medical screening examination/treatment/procedure(s) were performed by non-physician practitioner and as supervising physician I was immediately available for consultation/collaboration.   Rolan Bucco, MD 02/14/13 [phone]

## 2013-04-12 ENCOUNTER — Other Ambulatory Visit: Payer: Self-pay | Admitting: Neurology

## 2013-06-13 ENCOUNTER — Other Ambulatory Visit: Payer: Self-pay | Admitting: Neurology

## 2013-06-14 NOTE — Telephone Encounter (Signed)
Rx signed and faxed.

## 2013-12-16 ENCOUNTER — Encounter: Payer: Self-pay | Admitting: Neurology

## 2013-12-16 ENCOUNTER — Ambulatory Visit (INDEPENDENT_AMBULATORY_CARE_PROVIDER_SITE_OTHER): Payer: BC Managed Care – PPO | Admitting: Neurology

## 2013-12-16 VITALS — BP 138/83 | HR 90 | Wt 145.0 lb

## 2013-12-16 DIAGNOSIS — R413 Other amnesia: Secondary | ICD-10-CM | POA: Insufficient documentation

## 2013-12-16 DIAGNOSIS — M797 Fibromyalgia: Secondary | ICD-10-CM

## 2013-12-16 DIAGNOSIS — IMO0001 Reserved for inherently not codable concepts without codable children: Secondary | ICD-10-CM

## 2013-12-16 HISTORY — DX: Other amnesia: R41.3

## 2013-12-16 MED ORDER — GABAPENTIN 600 MG PO TABS: ORAL_TABLET | ORAL | Status: DC

## 2013-12-16 MED ORDER — ZOLPIDEM TARTRATE 5 MG PO TABS: 5.0000 mg | ORAL_TABLET | Freq: Every evening | ORAL | Status: DC | PRN

## 2013-12-16 NOTE — Patient Instructions (Signed)
Fibromyalgia Fibromyalgia is a disorder that is often misunderstood. It is associated with muscular pains and tenderness that comes and goes. It is often associated with fatigue and sleep disturbances. Though it tends to be long-lasting, fibromyalgia is not life-threatening. CAUSES  The exact cause of fibromyalgia is unknown. People with certain gene types are predisposed to developing fibromyalgia and other conditions. Certain factors can play a role as triggers, such as:  Spine disorders.  Arthritis.  Severe injury (trauma) and other physical stressors.  Emotional stressors. SYMPTOMS   The main symptom is pain and stiffness in the muscles and joints, which can vary over time.  Sleep and fatigue problems. Other related symptoms may include:  Bowel and bladder problems.  Headaches.  Visual problems.  Problems with odors and noises.  Depression or mood changes.  Painful periods (dysmenorrhea).  Dryness of the skin or eyes. DIAGNOSIS  There are no specific tests for diagnosing fibromyalgia. Patients can be diagnosed accurately from the specific symptoms they have. The diagnosis is made by determining that nothing else is causing the problems. TREATMENT  There is no cure. Management includes medicines and an active, healthy lifestyle. The goal is to enhance physical fitness, decrease pain, and improve sleep. HOME CARE INSTRUCTIONS   Only take over-the-counter or prescription medicines as directed by your caregiver. Sleeping pills, tranquilizers, and pain medicines may make your problems worse.  Low-impact aerobic exercise is very important and advised for treatment. At first, it may seem to make pain worse. Gradually increasing your tolerance will overcome this feeling.  Learning relaxation techniques and how to control stress will help you. Biofeedback, visual imagery, hypnosis, muscle relaxation, yoga, and meditation are all options.  Anti-inflammatory medicines and  physical therapy may provide short-term help.  Acupuncture or massage treatments may help.  Take muscle relaxant medicines as suggested by your caregiver.  Avoid stressful situations.  Plan a healthy lifestyle. This includes your diet, sleep, rest, exercise, and friends.  Find and practice a hobby you enjoy.  Join a fibromyalgia support group for interaction, ideas, and sharing advice. This may be helpful. SEEK MEDICAL CARE IF:  You are not having good results or improvement from your treatment. FOR MORE INFORMATION  National Fibromyalgia Association: www.fmaware.org Arthritis Foundation: www.arthritis.org Document Released: 05/06/2005 Document Revised: 07/29/2011 Document Reviewed: 08/16/2009 Exit[employer] Patient Information 2015 [employer]. This information is not intended to replace advice given to you by your health care provider. Make sure you discuss any questions you have with your health care provider.  

## 2013-12-16 NOTE — Progress Notes (Signed)
Reason for visit: Fibromyalgia  Amanda Waters is an 58 y.o. female  History of present illness:  [name] is a 59 year old right-handed white female with a history of fibromyalgia. She has had mild white matter abnormalities on MRI of the brain in the past, and she has undergone a workup for multiple sclerosis. She has had negative spinal fluid evaluation, and negative blood work. She has done better with her fibromyalgia symptoms on a gluten-free diet. The patient however, indicates that she has had some increased problems with memory that has been present over the last 6 to 8 months. The patient occasionally will have some trouble with directions while driving, but she still operates a motor vehicle. She will have short-term memory and difficulty with concentration. She recently underwent hammertoe surgery on the left foot. The patient recently has developed some left shoulder discomfort. She is on gabapentin, and she takes Ambien at night for sleep. She returns to this office for an evaluation.  Past Medical History  Diagnosis Date  . Fibromyalgia   . Occipital neuralgia   . Chronic back pain   . HSV-1 (herpes simplex virus 1) infection   . HSV-2 (herpes simplex virus 2) infection   . Kidney infection   . Kidney stones   . Dyslipidemia   . GERD (gastroesophageal reflux disease)   . IBS (irritable bowel syndrome)   . History of carpal tunnel syndrome   . History of hemorrhoids   . Osteopenia   . Iron deficiency anemia   . Memory difficulty 12/16/2013  . Left radial fracture     Radial head fracture    Past Surgical History  Procedure Laterality Date  . Tonsillectomy    . Cystectomy    . Cholecystectomy    . Dilation and curettage of uterus    . Shoulder surgery    . Hammartoe surgery Left     2nd toe    Family History  Problem Relation Age of Onset  . Fibromyalgia Mother   . Diabetes Father   . Thyroid cancer Father   . Heart disease Father   . Dementia Father      Social history:  reports that she has never smoked. She has never used smokeless tobacco. She reports that she drinks alcohol. She reports that she does not use illicit drugs.    Allergies  Allergen Reactions  . Celebrex [Celecoxib]   . Darvon [Propoxyphene]   . Duract [Bromfenac]   . Prednisone     Medications:  Current Outpatient Prescriptions on File Prior to Visit  Medication Sig Dispense Refill  . Calcium Carbonate-Vitamin D (CALTRATE 600+D) 600-400 MG-UNIT per chew tablet Chew 1 tablet by mouth daily.      . cyanocobalamin 1000 MCG tablet Take 100 mcg by mouth daily.      . cyclobenzaprine (FLEXERIL) 10 MG tablet Take 10 mg by mouth 3 (three) times daily as needed.      . diclofenac (FLECTOR) 1.3 % PTCH Place 1 patch onto the skin daily as needed.      . famciclovir (FAMVIR) 500 MG tablet Take 500 mg by mouth daily.      Marland Kitchen HYDROcodone-acetaminophen (NORCO/VICODIN) 5-325 MG per tablet Take 1 tablet by mouth every 6 (six) hours as needed for pain.      Marland Kitchen loratadine (CLARITIN) 10 MG tablet Take 10 mg by mouth daily as needed.       . pantoprazole (PROTONIX) 40 MG tablet Take 40 mg by mouth  daily.      . vitamin E 400 UNIT capsule Take 400 Units by mouth daily.       No current facility-administered medications on file prior to visit.    ROS:  Out of a complete 14 system review of symptoms, the patient complains only of the following symptoms, and all other reviewed systems are negative.  Neck pain, neck stiffness Memory disturbance  Blood pressure 138/83, pulse 90, weight 145 lb (65.772 kg).  Physical Exam  General: The patient is alert and cooperative at the time of the examination.  Neuromuscular: The patient has some restriction of head turning to the right, full movement of the left.  Skin: No significant peripheral edema is noted.   Neurologic Exam  Mental status: The patient is oriented x 3. Mini-Mental status examination done today shows a total score of  30/30.  Cranial nerves: Facial symmetry is present. Speech is normal, no aphasia or dysarthria is noted. Extraocular movements are full. Visual fields are full.  Motor: The patient has good strength in all 4 extremities.  Sensory examination: Soft touch sensation is symmetric on the face, arms, and legs.  Coordination: The patient has good finger-nose-finger and heel-to-shin bilaterally.  Gait and station: The patient has a normal gait. Tandem gait is normal. Romberg is negative. No drift is seen.  Reflexes: Deep tendon reflexes are symmetric.   Assessment/Plan:  One. Fibromyalgia  2. Abnormal MRI brain  3. Reported memory disturbance  The difficulty with memory and concentration may be associated with fibromyalgia. The patient's father has Alzheimer's disease. The patient will undergo a repeat MRI of the brain to compare to the prior study done 2 years ago. She will followup in about 6-8 months. Prescriptions were for the Ambien and for the gabapentin. The patient will continue the gluten-free diet.  Jill Alexanders MD 12/16/2013 11:27 AM  [facility] [address] [place], [place] [address]  Phone [phone] Fax [phone]

## 2014-06-20 ENCOUNTER — Ambulatory Visit: Payer: BC Managed Care – PPO | Admitting: Neurology

## 2014-07-28 ENCOUNTER — Other Ambulatory Visit: Payer: Self-pay | Admitting: Neurology

## 2014-07-29 NOTE — Telephone Encounter (Signed)
Rx signed and faxed.

## 2014-10-17 ENCOUNTER — Other Ambulatory Visit: Payer: Self-pay | Admitting: Neurology

## 2014-11-07 ENCOUNTER — Other Ambulatory Visit: Payer: Self-pay | Admitting: Neurology

## 2014-11-08 ENCOUNTER — Other Ambulatory Visit: Payer: Self-pay

## 2014-11-08 MED ORDER — ZOLPIDEM TARTRATE 5 MG PO TABS: 5.0000 mg | ORAL_TABLET | Freq: Every evening | ORAL | Status: DC | PRN

## 2014-11-08 NOTE — Telephone Encounter (Signed)
Rx signed and faxed.

## 2014-12-19 ENCOUNTER — Ambulatory Visit (INDEPENDENT_AMBULATORY_CARE_PROVIDER_SITE_OTHER): Payer: 59 | Admitting: Neurology

## 2014-12-19 ENCOUNTER — Encounter: Payer: Self-pay | Admitting: Neurology

## 2014-12-19 VITALS — BP 140/69 | HR 73 | Ht 65.0 in | Wt 138.0 lb

## 2014-12-19 DIAGNOSIS — R930 Abnormal findings on diagnostic imaging of skull and head, not elsewhere classified: Secondary | ICD-10-CM

## 2014-12-19 DIAGNOSIS — M797 Fibromyalgia: Secondary | ICD-10-CM

## 2014-12-19 DIAGNOSIS — R413 Other amnesia: Secondary | ICD-10-CM

## 2014-12-19 DIAGNOSIS — R9089 Other abnormal findings on diagnostic imaging of central nervous system: Secondary | ICD-10-CM

## 2014-12-19 HISTORY — DX: Other abnormal findings on diagnostic imaging of central nervous system: R90.89

## 2014-12-19 MED ORDER — ZOLPIDEM TARTRATE 5 MG PO TABS: 5.0000 mg | ORAL_TABLET | Freq: Every evening | ORAL | Status: DC | PRN

## 2014-12-19 MED ORDER — GABAPENTIN 600 MG PO TABS: ORAL_TABLET | ORAL | Status: DC

## 2014-12-19 NOTE — Progress Notes (Signed)
Reason for visit: Fibromyalgia  Amanda Waters is an 59 y.o. female  History of present illness:  Amanda Waters is a 59 year old right-handed white female with a history of fibromyalgia. She has markedly improved her symptoms by going on a gluten-free diet, she has also reduced the dosing on her Protonix, and she feels much better with her cognitive functioning and her fibromyalgia symptoms. The patient has a history of white matter abnormalities by MRI of the brain, she was set up for a follow-up study last year, but she decided not to undergo the study. The patient in general feels much better, she returns for an evaluation. Recently, she has been treated for enlarged lymph nodes of the abdomen, she is on Cipro for this.  Past Medical History  Diagnosis Date  . Fibromyalgia   . Occipital neuralgia   . Chronic back pain   . HSV-1 (herpes simplex virus 1) infection   . HSV-2 (herpes simplex virus 2) infection   . Kidney infection   . Kidney stones   . Dyslipidemia   . GERD (gastroesophageal reflux disease)   . IBS (irritable bowel syndrome)   . History of carpal tunnel syndrome   . History of hemorrhoids   . Osteopenia   . Iron deficiency anemia   . Memory difficulty 12/16/2013  . Left radial fracture     Radial head fracture  . Swollen lymph nodes   . Abnormal finding on MRI of brain 12/19/2014    Past Surgical History  Procedure Laterality Date  . Tonsillectomy    . Cystectomy    . Cholecystectomy    . Dilation and curettage of uterus    . Shoulder surgery    . Hammartoe surgery Left     2nd toe    Family History  Problem Relation Age of Onset  . Fibromyalgia Mother   . Diabetes Father   . Thyroid cancer Father   . Heart disease Father   . Dementia Father     Social history:  reports that she has never smoked. She has never used smokeless tobacco. She reports that she drinks alcohol. She reports that she does not use illicit drugs.    Allergies  Allergen  Reactions  . Celebrex [Celecoxib]   . Darvon [Propoxyphene]   . Duract [Bromfenac]   . Prednisone   . Sulfa Antibiotics     Medications:  Prior to Admission medications   Medication Sig Start Date End Date Taking? Authorizing Provider  Calcium Carbonate-Vitamin D (CALTRATE 600+D) 600-400 MG-UNIT per chew tablet Chew 1 tablet by mouth daily.   Yes Historical Provider, MD  ciprofloxacin (CIPRO) 500 MG tablet Take 500 mg by mouth 2 (two) times daily.   Yes Historical Provider, MD  cyanocobalamin 1000 MCG tablet Take 100 mcg by mouth daily.   Yes Historical Provider, MD  cyclobenzaprine (FLEXERIL) 10 MG tablet Take 10 mg by mouth 3 (three) times daily as needed.   Yes Historical Provider, MD  diclofenac (FLECTOR) 1.3 % PTCH Place 1 patch onto the skin daily as needed.   Yes Historical Provider, MD  ESTRACE VAGINAL 0.1 MG/GM vaginal cream Place 1 applicator vaginally once a week. 11/12/14  Yes Historical Provider, MD  famciclovir (FAMVIR) 500 MG tablet Take 500 mg by mouth daily.   Yes Historical Provider, MD  famotidine (PEPCID) 20 MG tablet Take 20 mg by mouth at bedtime.   Yes Historical Provider, MD  gabapentin (NEURONTIN) 600 MG tablet TAKE 1 TABLET BY  MOUTH TWICE A DAY AND 1/2 TABLET MIDDAY 10/17/14  Yes Kathrynn Ducking, MD  HYDROcodone-acetaminophen (NORCO/VICODIN) 5-325 MG per tablet Take 1 tablet by mouth every 6 (six) hours as needed for pain.   Yes Historical Provider, MD  loratadine (CLARITIN) 10 MG tablet Take 10 mg by mouth daily as needed.    Yes Historical Provider, MD  Nutritional Supplements (DHEA PO) Take 5 mg by mouth daily.   Yes Historical Provider, MD  pantoprazole (PROTONIX) 40 MG tablet Take 40 mg by mouth daily. Patient takes every 3 days   Yes Historical Provider, MD  pyridOXINE (VITAMIN B-6) 50 MG tablet Take 50 mg by mouth daily.   Yes Historical Provider, MD  vitamin E 400 UNIT capsule Take 400 Units by mouth daily.   Yes Historical Provider, MD  zolpidem (AMBIEN) 5  MG tablet Take 1 tablet (5 mg total) by mouth at bedtime as needed. for sleep 11/08/14  Yes Kathrynn Ducking, MD    ROS:  Out of a complete 14 system review of symptoms, the patient complains only of the following symptoms, and all other reviewed systems are negative.  Abdominal pain Neuromuscular discomfort  Blood pressure 140/69, pulse 73, height 5\' 5"  (1.651 m), weight 138 lb (62.596 kg).  Physical Exam  General: The patient is alert and cooperative at the time of the examination.  Skin: No significant peripheral edema is noted.   Neurologic Exam  Mental status: The patient is alert and oriented x 3 at the time of the examination. The patient has apparent normal recent and remote memory, with an apparently normal attention span and concentration ability. Mini-Mental Status Examination done today shows a total score of 30/30. The patient is able to name 14 animals in 30 seconds.   Cranial nerves: Facial symmetry is present. Speech is normal, no aphasia or dysarthria is noted. Extraocular movements are full. Visual fields are full.  Motor: The patient has good strength in all 4 extremities.  Sensory examination: Soft touch sensation is symmetric on the face, arms, and legs.  Coordination: The patient has good finger-nose-finger and heel-to-shin bilaterally.  Gait and station: The patient has a normal gait. Tandem gait is normal. Romberg is negative. No drift is seen.  Reflexes: Deep tendon reflexes are symmetric.   Assessment/Plan:  1. Fibromyalgia  2. Reported memory disturbance  3. Abnormal MRI brain  Overall, the patient is doing much better at this time. The patient has decided not to have the MRI of the brain. She will follow-up in one year. A prescription was given for the gabapentin and for the Ambien that she takes for sleep.  Amanda Alexanders MD 12/19/2014 7:53 PM  [facility] [address] [place], [place] [address]  Phone  [phone] Fax [phone]

## 2014-12-19 NOTE — Patient Instructions (Signed)
Fibromyalgia Fibromyalgia is a disorder that is often misunderstood. It is associated with muscular pains and tenderness that comes and goes. It is often associated with fatigue and sleep disturbances. Though it tends to be long-lasting, fibromyalgia is not life-threatening. CAUSES  The exact cause of fibromyalgia is unknown. People with certain gene types are predisposed to developing fibromyalgia and other conditions. Certain factors can play a role as triggers, such as:  Spine disorders.  Arthritis.  Severe injury (trauma) and other physical stressors.  Emotional stressors. SYMPTOMS   The main symptom is pain and stiffness in the muscles and joints, which can vary over time.  Sleep and fatigue problems. Other related symptoms may include:  Bowel and bladder problems.  Headaches.  Visual problems.  Problems with odors and noises.  Depression or mood changes.  Painful periods (dysmenorrhea).  Dryness of the skin or eyes. DIAGNOSIS  There are no specific tests for diagnosing fibromyalgia. Patients can be diagnosed accurately from the specific symptoms they have. The diagnosis is made by determining that nothing else is causing the problems. TREATMENT  There is no cure. Management includes medicines and an active, healthy lifestyle. The goal is to enhance physical fitness, decrease pain, and improve sleep. HOME CARE INSTRUCTIONS   Only take over-the-counter or prescription medicines as directed by your caregiver. Sleeping pills, tranquilizers, and pain medicines may make your problems worse.  Low-impact aerobic exercise is very important and advised for treatment. At first, it may seem to make pain worse. Gradually increasing your tolerance will overcome this feeling.  Learning relaxation techniques and how to control stress will help you. Biofeedback, visual imagery, hypnosis, muscle relaxation, yoga, and meditation are all options.  Anti-inflammatory medicines and  physical therapy may provide short-term help.  Acupuncture or massage treatments may help.  Take muscle relaxant medicines as suggested by your caregiver.  Avoid stressful situations.  Plan a healthy lifestyle. This includes your diet, sleep, rest, exercise, and friends.  Find and practice a hobby you enjoy.  Join a fibromyalgia support group for interaction, ideas, and sharing advice. This may be helpful. SEEK MEDICAL CARE IF:  You are not having good results or improvement from your treatment. FOR MORE INFORMATION  National Fibromyalgia Association: www.fmaware.org Arthritis Foundation: www.arthritis.org Document Released: 05/06/2005 Document Revised: 07/29/2011 Document Reviewed: 08/16/2009 Exit[employer] Patient Information 2015 [employer]. This information is not intended to replace advice given to you by your health care provider. Make sure you discuss any questions you have with your health care provider.  

## 2015-07-30 ENCOUNTER — Other Ambulatory Visit: Payer: Self-pay | Admitting: Neurology

## 2015-10-02 ENCOUNTER — Other Ambulatory Visit: Payer: Self-pay | Admitting: Neurology

## 2015-10-03 NOTE — Telephone Encounter (Signed)
Rx printed, signed, faxed to pharmacy. 

## 2015-12-21 ENCOUNTER — Ambulatory Visit (INDEPENDENT_AMBULATORY_CARE_PROVIDER_SITE_OTHER): Payer: 59 | Admitting: Neurology

## 2015-12-21 ENCOUNTER — Encounter: Payer: Self-pay | Admitting: Neurology

## 2015-12-21 VITALS — BP 151/79 | HR 80 | Ht 65.0 in | Wt 138.0 lb

## 2015-12-21 DIAGNOSIS — M797 Fibromyalgia: Secondary | ICD-10-CM | POA: Diagnosis not present

## 2015-12-21 DIAGNOSIS — G4731 Primary central sleep apnea: Secondary | ICD-10-CM

## 2015-12-21 DIAGNOSIS — R413 Other amnesia: Secondary | ICD-10-CM | POA: Diagnosis not present

## 2015-12-21 MED ORDER — GABAPENTIN 600 MG PO TABS: ORAL_TABLET | ORAL | 3 refills | Status: DC

## 2015-12-21 MED ORDER — ZOLPIDEM TARTRATE 5 MG PO TABS: 5.0000 mg | ORAL_TABLET | Freq: Every evening | ORAL | 3 refills | Status: DC | PRN

## 2015-12-21 NOTE — Progress Notes (Signed)
Reason for visit: Fibromyalgia  Amanda Waters is an 60 y.o. female  History of present illness:  Amanda Waters is a 60 year old right-handed white female with a history of fibromyalgia. The patient has chronic fatigue associated with this. She indicates that in the past she has had a sleep study through Dr. Jaynie Collins, she was told that she had central sleep apnea. She cannot tolerate the CPAP machine. The patient has had ongoing issues that have worsened over time. The patient does have trouble with concentration when the fatigue is quite severe. She takes an Ambien at night for sleep which seems to help. She has had increased fibromyalgia discomfort, she has gone up to 600 mg of gabapentin 3 times daily which has helped some. The patient is trying to stay active. She returns to this office for an evaluation.  Past Medical History:  Diagnosis Date  . Abnormal finding on MRI of brain 12/19/2014  . Chronic back pain   . Dyslipidemia   . Fibromyalgia   . GERD (gastroesophageal reflux disease)   . History of carpal tunnel syndrome   . History of hemorrhoids   . HSV-1 (herpes simplex virus 1) infection   . HSV-2 (herpes simplex virus 2) infection   . IBS (irritable bowel syndrome)   . Iron deficiency anemia   . Kidney infection   . Kidney stones   . Left radial fracture    Radial head fracture  . Memory difficulty 12/16/2013  . Occipital neuralgia   . Osteopenia   . Swollen lymph nodes     Past Surgical History:  Procedure Laterality Date  . CHOLECYSTECTOMY    . CYSTECTOMY    . DILATION AND CURETTAGE OF UTERUS    . hammartoe surgery Left    2nd toe  . SHOULDER SURGERY    . TONSILLECTOMY      Family History  Problem Relation Age of Onset  . Fibromyalgia Mother   . Diabetes Father   . Thyroid cancer Father   . Heart disease Father   . Dementia Father     Social history:  reports that she has never smoked. She has never used smokeless tobacco. She reports that she drinks  alcohol. She reports that she does not use drugs.    Allergies  Allergen Reactions  . Celebrex [Celecoxib]   . Darvon [Propoxyphene]   . Duract [Bromfenac]   . Oxycodone-Acetaminophen   . Prednisone   . Sulfa Antibiotics     Medications:  Prior to Admission medications   Medication Sig Start Date End Date Taking? Authorizing Provider  b complex vitamins tablet Take 1 tablet by mouth daily.   Yes Historical Provider, MD  Calcium Carbonate-Vitamin D (CALTRATE 600+D) 600-400 MG-UNIT per chew tablet Chew 1 tablet by mouth daily.   Yes Historical Provider, MD  cyclobenzaprine (FLEXERIL) 10 MG tablet Take 10 mg by mouth 3 (three) times daily as needed.   Yes Historical Provider, MD  diclofenac (FLECTOR) 1.3 % PTCH Place 1 patch onto the skin daily as needed.   Yes Historical Provider, MD  ESTRACE VAGINAL 0.1 MG/GM vaginal cream Place 1 applicator vaginally once a week. 11/12/14  Yes Historical Provider, MD  famciclovir (FAMVIR) 500 MG tablet Take 500 mg by mouth daily.   Yes Historical Provider, MD  famotidine (PEPCID) 20 MG tablet Take 20 mg by mouth at bedtime.   Yes Historical Provider, MD  gabapentin (NEURONTIN) 600 MG tablet TAKE 1 TABLET BY MOUTH TWICE A DAY  AND 1/2 TABLET MIDDAY Patient taking differently: TAKE 1 TABLET BY MOUTH TID 07/31/15  Yes Kathrynn Ducking, MD  HYDROcodone-acetaminophen (NORCO/VICODIN) 5-325 MG per tablet Take 1 tablet by mouth every 6 (six) hours as needed for pain.   Yes Historical Provider, MD  loratadine (CLARITIN) 10 MG tablet Take 10 mg by mouth daily as needed.    Yes Historical Provider, MD  Nutritional Supplements (DHEA PO) Take 5 mg by mouth daily.   Yes Historical Provider, MD  vitamin E 400 UNIT capsule Take 400 Units by mouth daily.   Yes Historical Provider, MD  zolpidem (AMBIEN) 5 MG tablet TAKE 1 TABLET BY MOUTH AT BEDTIME AS NEEDED FOR SLEEP 10/03/15  Yes Kathrynn Ducking, MD  pantoprazole (PROTONIX) 40 MG tablet Take 40 mg by mouth daily. Patient  takes every 3 days    Historical Provider, MD    ROS:  Out of a complete 14 system review of symptoms, the patient complains only of the following symptoms, and all other reviewed systems are negative.  Fatigue Muscle pain  Blood pressure (!) 151/79, pulse 80, height 5\' 5"  (1.651 m), weight 138 lb (62.6 kg).  Physical Exam  General: The patient is alert and cooperative at the time of the examination.  Skin: No significant peripheral edema is noted.   Neurologic Exam  Mental status: The patient is alert and oriented x 3 at the time of the examination. The patient has apparent normal recent and remote memory, with an apparently normal attention span and concentration ability.   Cranial nerves: Facial symmetry is present. Speech is normal, no aphasia or dysarthria is noted. Extraocular movements are full. Visual fields are full.  Motor: The patient has good strength in all 4 extremities.  Sensory examination: Soft touch sensation is symmetric on the face, arms, and legs.  Coordination: The patient has good finger-nose-finger and heel-to-shin bilaterally.  Gait and station: The patient has a normal gait. Tandem gait is normal. Romberg is negative. No drift is seen.  Reflexes: Deep tendon reflexes are symmetric.   Assessment/Plan:  1. Fibromyalgia  2. Chronic fatigue  3. History of central sleep apnea  The patient will be sent for a sleep evaluation to reevaluate the history of central sleep apnea. The patient was given another prescription for gabapentin, and for Ambien. The patient will follow-up in one year, sooner if needed.  Jill Alexanders MD 12/21/2015 11:12 AM  [facility] [address] [place], [place] [address]  Phone [phone] Fax [phone]

## 2016-01-11 ENCOUNTER — Encounter: Payer: Self-pay | Admitting: Neurology

## 2016-01-11 ENCOUNTER — Ambulatory Visit (INDEPENDENT_AMBULATORY_CARE_PROVIDER_SITE_OTHER): Payer: 59 | Admitting: Neurology

## 2016-01-11 VITALS — BP 122/62 | HR 76 | Resp 16 | Ht 65.0 in | Wt 137.0 lb

## 2016-01-11 DIAGNOSIS — M797 Fibromyalgia: Secondary | ICD-10-CM

## 2016-01-11 DIAGNOSIS — G478 Other sleep disorders: Secondary | ICD-10-CM | POA: Diagnosis not present

## 2016-01-11 DIAGNOSIS — R413 Other amnesia: Secondary | ICD-10-CM

## 2016-01-11 DIAGNOSIS — R0681 Apnea, not elsewhere classified: Secondary | ICD-10-CM

## 2016-01-11 DIAGNOSIS — G4710 Hypersomnia, unspecified: Secondary | ICD-10-CM

## 2016-01-11 DIAGNOSIS — G4733 Obstructive sleep apnea (adult) (pediatric): Secondary | ICD-10-CM | POA: Diagnosis not present

## 2016-01-11 NOTE — Patient Instructions (Signed)
Based on your symptoms and your exam I believe you may still be at risk for obstructive sleep apnea or OSA, and I think we should proceed with a sleep study to determine whether you do or do not have OSA and how severe it is. If you have more than mild OSA, I want you to consider treatment with CPAP. Please remember, the risks and ramifications of moderate to severe obstructive sleep apnea or OSA are: Cardiovascular disease, including congestive heart failure, stroke, difficult to control hypertension, arrhythmias, and even type 2 diabetes has been linked to untreated OSA. Sleep apnea causes disruption of sleep and sleep deprivation in most cases, which, in turn, can cause recurrent headaches, problems with memory, mood, concentration, focus, and vigilance. Most people with untreated sleep apnea report excessive daytime sleepiness, which can affect their ability to drive. Please do not drive if you feel sleepy.   I will likely see you back after your sleep study to go over the test results and where to go from there. We will call you after your sleep study to advise about the results (most likely, you will hear from Diana, my nurse) and to set up an appointment at the time, as necessary.    Our sleep lab administrative assistant, Dawn will meet with you or call you to schedule your sleep study. If you don't hear back from her by next week please feel free to call her at [phone]. This is her direct line and please leave a message with your phone number to call back if you get the voicemail box. She will call back as soon as possible.   

## 2016-01-11 NOTE — Progress Notes (Signed)
Subjective:    Patient ID: Amanda Waters is a 60 y.o. female.  HPI     Star Age, MD, PhD [facility] [address], [address] [place], [place] [address]  Dear [name],   I saw your patient, Amanda Waters, upon your kind request in my clinic today for initial consultation of her sleep disorder, in particular, concern for sleep-disordered breathing. The patient is unaccompanied today. As you know, [name] is a 60 year old right-handed woman with an underlying medical history fibromyalgia, chronic back pain, hyperlipidemia, reflux disease, carpal tunnel syndrome, irritable bowel syndrome, iron deficiency, kidney stone, left radial fracture, memory difficulty, and osteopenia, who reports snoring and witnessed breathing pauses while asleep, as well as difficulty with sleep including sleep onset problems and daytime tiredness. She had a sleep study in the past and I reviewed the results. [facility]. Apnea hypopnea index was 10.8 per hour, she had 11 obstructive, 9 mixed and 17 central apneas. She had 34 obstructive hypopneas. Sleep efficiency was near normal at 88.5%, REM percentage 16.9%, sleep latency 31.5 minutes, average oxygen saturation 95%, nadir was 79%. She had a CPAP titration study on 08/04/2006. She was titrated on CPAP from 3 cm to 7 cm from what I can see. Apnea hypopneas index was 5.9 on the final pressure. Average oxygen saturation was 96%. She did not get a CPAP machine at the time. I reviewed your office note from 12/21/2015. Her husband has OSA and uses a CPAP machine, she would be willing to try CPAP this time around.  She works as an Futures trader. Bedtime varies, generally between midnight and 1 AM, wake up time around 8 AM. She denies nocturia and has occasional morning headaches. Epworth sleepiness score is 7 out of 24 today, fatigue score is 47 out of 67. She has been on Ambien  for years, at least 5 years, takes 5 mg strength a third of a pill each night, she takes hydrocodone as needed, Flexeril as needed, and gabapentin 600 mg 3 times a day. She does not have a family history of obstructive sleep apnea. She thinks a lot of water, one glass of iced tea with lunch, is a nonsmoker and drinks alcohol rarely.  His Past Medical History Is Significant For: Past Medical History:  Diagnosis Date  . Abnormal finding on MRI of brain 12/19/2014  . Chronic back pain   . Dyslipidemia   . Fibromyalgia   . GERD (gastroesophageal reflux disease)   . History of carpal tunnel syndrome   . History of hemorrhoids   . HSV-1 (herpes simplex virus 1) infection   . HSV-2 (herpes simplex virus 2) infection   . IBS (irritable bowel syndrome)   . Iron deficiency anemia   . Kidney infection   . Kidney stones   . Left radial fracture    Radial head fracture  . Memory difficulty 12/16/2013  . Occipital neuralgia   . Osteopenia   . Swollen lymph nodes     His Past Surgical History Is Significant For: Past Surgical History:  Procedure Laterality Date  . CHOLECYSTECTOMY    . CYSTECTOMY    . DILATION AND CURETTAGE OF UTERUS    . hammartoe surgery Left    2nd toe  . SHOULDER SURGERY    . TONSILLECTOMY      His Family History Is Significant For: Family History  Problem Relation Age of Onset  . Fibromyalgia Mother   .  Diabetes Father   . Thyroid cancer Father   . Heart disease Father   . Dementia Father     His Social History Is Significant For: Social History   Social History  . Marital status: Married    Spouse name: N/A  . Number of children: 0  . Years of education: 48   Occupational History  . Futures trader    Social History Main Topics  . Smoking status: Never Smoker  . Smokeless tobacco: Never Used  . Alcohol use Yes     Comment: Consumes alcohol on occasion  . Drug use: No  . Sexual activity: Not Asked   Other Topics Concern  . None   Social  History Narrative   Lives at home w/ her husband and dog   Patient drinks 1 cup of tea a day.    Patient is right handed.    His Allergies Are:  Allergies  Allergen Reactions  . Celebrex [Celecoxib]   . Darvon [Propoxyphene]   . Duract [Bromfenac]   . Oxycodone-Acetaminophen   . Prednisone   . Sulfa Antibiotics   :   His Current Medications Are:  Outpatient Encounter Prescriptions as of 01/11/2016  Medication Sig  . b complex vitamins tablet Take 1 tablet by mouth daily.  . Calcium Carbonate-Vitamin D (CALTRATE 600+D) 600-400 MG-UNIT per chew tablet Chew 1 tablet by mouth daily.  . cyclobenzaprine (FLEXERIL) 10 MG tablet Take 10 mg by mouth 3 (three) times daily as needed.  . diclofenac (FLECTOR) 1.3 % PTCH Place 1 patch onto the skin daily as needed.  Marland Kitchen ESTRACE VAGINAL 0.1 MG/GM vaginal cream Place 1 applicator vaginally once a week.  . famciclovir (FAMVIR) 500 MG tablet Take 500 mg by mouth daily.  . famotidine (PEPCID) 20 MG tablet Take 20 mg by mouth at bedtime.  . gabapentin (NEURONTIN) 600 MG tablet TAKE 1 TABLET BY MOUTH TID  . HYDROcodone-acetaminophen (NORCO/VICODIN) 5-325 MG per tablet Take 1 tablet by mouth every 6 (six) hours as needed for pain.  Marland Kitchen loratadine (CLARITIN) 10 MG tablet Take 10 mg by mouth daily as needed.   . Nutritional Supplements (DHEA PO) Take 5 mg by mouth daily.  . pantoprazole (PROTONIX) 40 MG tablet Take 40 mg by mouth daily. Patient takes every 3 days  . vitamin E 400 UNIT capsule Take 400 Units by mouth daily.  Marland Kitchen zolpidem (AMBIEN) 5 MG tablet Take 1 tablet (5 mg total) by mouth at bedtime as needed. for sleep   No facility-administered encounter medications on file as of 01/11/2016.   :  Review of Systems:  Out of a complete 14 point review of systems, all are reviewed and negative with the exception of these symptoms as listed below:  Review of Systems  Neurological:       Patient had sleep study about 9 years ago. States that they tried  her on CPAP but she was unable to tolerate.  No trouble falling or staying asleep with 1/3 ambien, snoring, she has been told that she had CSA and husband has witnessed apnea, wakes up feeling tired, morning headaches, daytime fatigue.     Epworth Sleepiness Scale 0= would never doze 1= slight chance of dozing 2= moderate chance of dozing 3= high chance of dozing  Sitting and reading:2 Watching TV:2 Sitting inactive in a public place (ex. Theater or meeting):0 As a passenger in a car for an hour without a break:1 Lying down to rest in the afternoon:2 Sitting and talking  to someone:0 Sitting quietly after lunch (no alcohol):0 In a car, while stopped in traffic:0 Total:7  Objective:  Neurologic Exam  Physical Exam Physical Examination:   Vitals:   01/11/16 1103  BP: 122/62  Pulse: 76  Resp: 16   General Examination: The patient is a very pleasant 60 y.o. female in no acute distress. She appears well-developed and well-nourished and well groomed.   HEENT: Normocephalic, atraumatic, pupils are equal, round and reactive to light and accommodation. Funduscopic exam is normal with sharp disc margins noted. Extraocular tracking is good without limitation to gaze excursion or nystagmus noted. Normal smooth pursuit is noted. Hearing is grossly intact. Tympanic membranes are clear bilaterally. Face is symmetric with normal facial animation and normal facial sensation. Speech is clear with no dysarthria noted. There is no hypophonia. There is no lip, neck/head, jaw or voice tremor. Neck is supple with full range of passive and active motion. There are no carotid bruits on auscultation. Oropharynx exam reveals: no mouth dryness, good dental hygiene and mild airway crowding, due to smaller airway entry and longer tongue. Mallampati is class II. Tongue protrudes centrally and palate elevates symmetrically. Tonsils are absent. Neck size is 14 inches. She has a Mild overbite. Nasal inspection reveals  no significant nasal mucosal bogginess or redness and no septal deviation.   Chest: Clear to auscultation without wheezing, rhonchi or crackles noted.  Heart: S1+S2+0, regular and normal without murmurs, rubs or gallops noted.   Abdomen: Soft, non-tender and non-distended with normal bowel sounds appreciated on auscultation.  Extremities: There is no pitting edema in the distal lower extremities bilaterally. Pedal pulses are intact.  Skin: Warm and dry without trophic changes noted. There are no varicose veins.  Musculoskeletal: exam reveals no obvious joint deformities, tenderness or joint swelling or erythema.   Neurologically:  Mental status: The patient is awake, alert and oriented in all 4 spheres. Her immediate and remote memory, attention, language skills and fund of knowledge are appropriate. There is no evidence of aphasia, agnosia, apraxia or anomia. Speech is clear with normal prosody and enunciation. Thought process is linear. Mood is normal and affect is normal.  Cranial nerves II - XII are as described above under HEENT exam. In addition: shoulder shrug is normal with equal shoulder height noted. Motor exam: Normal bulk, strength and tone is noted. There is no drift, tremor or rebound. Romberg is negative. Reflexes are 2+ throughout. Fine motor skills and coordination: intact with normal finger taps, normal hand movements, normal rapid alternating patting, normal foot taps and normal foot agility.  Cerebellar testing: No dysmetria or intention tremor on finger to nose testing. Heel to shin is unremarkable bilaterally. There is no truncal or gait ataxia.  Sensory exam: intact to light touch, pinprick, vibration, temperature sense in the upper and lower extremities.  Gait, station and balance: She stands easily. No veering to one side is noted. No leaning to one side is noted. Posture is age-appropriate and stance is narrow based. Gait shows normal stride length and normal pace. No  problems turning are noted. Tandem walk is unremarkable. Intact toe and heel stance is noted.               Assessment and Plan:   In summary, Caci H Mezquita is a very pleasant 60 y.o.-year old female with an underlying medical history fibromyalgia, chronic back pain, hyperlipidemia, reflux disease, carpal tunnel syndrome, irritable bowel syndrome, iron deficiency, kidney stone, left radial fracture, memory difficulty, and osteopenia, whose  history and physical exam are somewhat concerning for obstructive sleep apnea (OSA). I had a long chat with the patient about my findings and the diagnosis of OSA, its prognosis and treatment options. We talked about medical treatments, surgical interventions and non-pharmacological approaches. I explained in particular the risks and ramifications of untreated moderate to severe OSA, especially with respect to developing cardiovascular disease down the Road, including congestive heart failure, difficult to treat hypertension, cardiac arrhythmias, or stroke. Even type 2 diabetes has, in part, been linked to untreated OSA. Symptoms of untreated OSA include daytime sleepiness, memory problems, mood irritability and mood disorder such as depression and anxiety, lack of energy, as well as recurrent headaches, especially morning headaches. We talked about trying to maintain a healthy lifestyle in general, as well as the importance of weight control. I encouraged the patient to eat healthy, exercise daily and keep well hydrated, to keep a scheduled bedtime and wake time routine, to not skip any meals and eat healthy snacks in between meals. I advised the patient not to drive when feeling sleepy. I recommended the following at this time: sleep study with potential positive airway pressure titration. (We will score hypopneas at 4% and split the sleep study into diagnostic and treatment portion, if the estimated. 2 hour AHI is >15/h). She would like to wait to schedule at a later  date. Is encouraged to call the sleep lab when ready.   I explained the sleep test procedure to the patient and also outlined possible surgical and non-surgical treatment options of OSA, including the use of a custom-made dental device (which would require a referral to a specialist dentist or oral surgeon), upper airway surgical options, such as pillar implants, radiofrequency surgery, tongue base surgery, and UPPP (which would involve a referral to an ENT surgeon). Rarely, jaw surgery such as mandibular advancement may be considered.  I also explained the CPAP treatment option to the patient, who indicated that she would be willing to try CPAP if the need arises. I explained the importance of being compliant with PAP treatment, not only for insurance purposes but primarily to improve Her symptoms, and for the patient's long term health benefit, including to reduce Her cardiovascular risks. I answered all her questions today and the patient was in agreement. I would like to see her back after the sleep study is completed and encouraged her to call with any interim questions, concerns, problems or updates.   Thank you very much for allowing me to participate in the care of this nice patient. If I can be of any further assistance to you please do not hesitate to talk to me.  Sincerely,   Star Age, MD, PhD

## 2016-09-23 NOTE — Progress Notes (Signed)
Office Visit Note  Patient: Amanda Waters             Date of Birth: April 29, 1956           MRN: [id number]             PCP: Maylon Peppers, MD Referring: Maylon Peppers, MD Visit Date: 09/27/2016 Occupation: '@GUAROCC'$ @    Subjective:  Pain in hips   History of Present Illness: Amanda Waters is a 61 y.o. female is been seen in consultation per request of her PCP for evaluation of her symptoms. According to patient her symptoms started 1999 with increased fatigue pain and positive tender points. She states she saw several physicians and then came to see me in year 2000. She states I diagnosed her with fibromyalgia syndrome she was tried on some natural supplements physical therapy and the pain was manageable. Later she was diagnosed with occipital neuralgia and 2007 and was taking gabapentin at very high dosage. She tried to reduce it down eventually. She states in 2008 she came to see me again as Lyrica was released. She tried Lyrica but it did not help her any different than gabapentin and it is more expensive. She went back to gabapentin. She states the pain was manageable so far. In October 2017 she started experiencing increased fatigue, generalized pain, increase feet discomfort. 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 She's been also having pain in her bilateral trochanteric area she states it catches in that area when she walks and she has nocturnal pain she is having difficulty sleeping on her sides and she's been trying to sleep on her back. She is suffering from insomnia. She's been taking anti-inflammatories for pain management. She also reports poor concentration. She was recently seen by her PCP who did lab work which was all negative.  Activities of Daily Living:  Patient reports morning stiffness for 30 minutes.   Patient Reports nocturnal pain.  Difficulty dressing/grooming: Denies Difficulty climbing stairs:  Denies Difficulty getting out of chair: Reports Difficulty using hands for taps, buttons, cutlery, and/or writing: Denies   Review of Systems  Constitutional: Positive for fatigue. Negative for night sweats, weight gain and weakness.  HENT: Positive for mouth sores. Negative for trouble swallowing, trouble swallowing, mouth dryness and nose dryness.   Eyes: Negative for pain, redness, visual disturbance and dryness.  Respiratory: Negative for cough, shortness of breath and difficulty breathing.   Cardiovascular: Negative for chest pain, palpitations, hypertension, irregular heartbeat and swelling in legs/feet.  Gastrointestinal: Positive for constipation and diarrhea. Negative for blood in stool.       IBS  Endocrine: Negative for increased urination.  Genitourinary: Negative for vaginal dryness.  Musculoskeletal: Positive for arthralgias, joint pain, joint swelling, myalgias, morning stiffness and myalgias. Negative for muscle weakness and muscle tenderness.  Skin: Negative for color change, rash, hair loss, skin tightness, ulcers and sensitivity to sunlight.  Allergic/Immunologic: Negative for susceptible to infections.  Neurological: Negative for dizziness, memory loss and night sweats.  Hematological: Negative for swollen glands.  Psychiatric/Behavioral: Positive for sleep disturbance. Negative for depressed mood. The patient is not nervous/anxious.     PMFS History:  Patient Active Problem List   Diagnosis Date Noted  . Multiple joint pain 09/26/2016  . Other fatigue 09/26/2016  . Myalgia 09/26/2016  . Abnormal finding on MRI of brain 12/19/2014  . Memory difficulty 12/16/2013  . Fibromyalgia 12/16/2012  . Other nonspecific  abnormal result of function study of brain and central nervous system 04/07/2012  . Disturbance of skin sensation 04/07/2012  . Headache(784.0) 04/07/2012    Past Medical History:  Diagnosis Date  . Abnormal finding on MRI of brain 12/19/2014  . Chronic  back pain   . Dyslipidemia   . Fibromyalgia   . GERD (gastroesophageal reflux disease)   . History of carpal tunnel syndrome   . History of hemorrhoids   . HSV-1 (herpes simplex virus 1) infection   . HSV-2 (herpes simplex virus 2) infection   . IBS (irritable bowel syndrome)   . 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 Iron deficiency anemia   . Kidney infection   . Kidney stones   . Left radial fracture    Radial head fracture  . Memory difficulty 12/16/2013  . Occipital neuralgia   . Osteopenia   . Swollen lymph nodes     Family History  Problem Relation Age of Onset  . Fibromyalgia Mother   . Diabetes Father   . Thyroid cancer Father   . Heart disease Father   . Dementia Father    Past Surgical History:  Procedure Laterality Date  . CHOLECYSTECTOMY    . CYSTECTOMY    . DILATION AND CURETTAGE OF UTERUS    . hammartoe surgery Left    2nd toe  . SHOULDER SURGERY    . TONSILLECTOMY     Social History   Social History Narrative   Lives at home w/ her husband and dog   Patient drinks 1 cup of tea a day.    Patient is right handed.     Objective: Vital Signs: BP 104/64 (BP Location: Right Arm)   Pulse 80   Resp 14   Ht '5\' 5"'$  (1.651 m)   Wt 132 lb (59.9 kg)   BMI 21.97 kg/m    Physical Exam  Constitutional: She is oriented to person, place, and time. She appears well-developed and well-nourished.  HENT:  Head: Normocephalic and atraumatic.  Eyes: Conjunctivae and EOM are normal.  Neck: Normal range of motion.  Cardiovascular: Normal rate, regular rhythm, normal heart sounds and intact distal pulses.   Pulmonary/Chest: Effort normal and breath sounds normal.  Abdominal: Soft. Bowel sounds are normal.  Lymphadenopathy:    She has no cervical adenopathy.  Neurological: She is alert and oriented to person, place, and time.  Skin: Skin is warm and dry. Capillary refill takes less than 2 seconds.  Psychiatric: She has a  normal mood and affect. Her behavior is normal.  Nursing note and vitals reviewed.    Musculoskeletal Exam: C-spine and thoracic lumbar spine good range of motion. Shoulder joints elbow joints wrist joints MCPs PIPs DIPs with good range of motion. She has mild thickening of some of her DIP joints in her hands. Hip joints knee joints ankles MTPs PIPs are good range of motion. She had bilateral bunions and some PIP/DIP thickening in her feet consistent with osteoarthritis. She has tenderness over bilateral trochanteric bursa. She had 18 out of 18 tender points. She also had hyperalgesia.  CDAI Exam: No CDAI exam completed.    Investigation: Findings:  07/18/2016 negative ANA, ESR normal 5, RF Negative 9.9, Hep C Ab non reactive, TSH normal 3.470, CMP normal with the exception of slightly low GFR 58, Lipid panel LDL 136, CBC normal and , UA trace of protein, cloudy, otherwise normal.     Imaging: No results found.  Speciality  Comments: No specialty comments available.    Procedures:  No procedures performed Allergies: Celebrex [celecoxib]; Darvon [propoxyphene]; Duract [bromfenac]; Oxycodone-acetaminophen; Prednisone; and Sulfa antibiotics   Assessment / Plan:     Visit Diagnoses: Trochanteric bursitis of both hips: She had tenderness over bilateral trochanteric bursa area. ITB and exercise were demonstrated in the office. I've given her a handout on IT band exercises as well.  Primary osteoarthritis of both feet: Her clinical findings are consistent with osteoarthritis in her feet. She's also had a toe surgery in the past. Proper fitting shoes were discussed. I offered x-rays but she declined.  Primary osteoarthritis of both hands: She has mild osteoarthritic changes in her hands.  Other fatigue: She's been experiencing increased fatigue. All her labs have been normal. All other autoimmune workup was negative.  History of fibromyalgia: Patient has long-standing history of  fibromyalgia and has been taking gabapentin for pain management. She's been experiencing increased pain lately. I believe some of the discomfort is coming from fibromyalgia and some osteoarthritis. Her GFR is slightly low. She's been taking anti-inflammatories. I've advised her to use Tylenol instead.  History of depression: She's not taking any medications currently. She may benefit from Cymbalta. I've advised her to discuss that with her PCP.  History of memory loss  History of hyperlipidemia  History of hyperglycemia    Orders: No orders of the defined types were placed in this encounter.  No orders of the defined types were placed in this encounter.   Face-to-face time spent with patient was 50 minutes. 50% of time was spent in counseling and coordination of care.  Follow-Up Instructions: Return if symptoms worsen or fail to improve, for Fibromyalgia, osteoarthritis.   Bo Merino, MD  Note - This record has been created using Editor, commissioning.  Chart creation errors have been sought, but may not always  have been located. Such creation errors do not reflect on  the standard of medical care.

## 2016-09-26 DIAGNOSIS — M2550 Pain in unspecified joint: Secondary | ICD-10-CM | POA: Insufficient documentation

## 2016-09-26 DIAGNOSIS — M791 Myalgia, unspecified site: Secondary | ICD-10-CM | POA: Insufficient documentation

## 2016-09-26 DIAGNOSIS — R5383 Other fatigue: Secondary | ICD-10-CM | POA: Insufficient documentation

## 2016-09-27 ENCOUNTER — Encounter: Payer: Self-pay | Admitting: Rheumatology

## 2016-09-27 ENCOUNTER — Ambulatory Visit (INDEPENDENT_AMBULATORY_CARE_PROVIDER_SITE_OTHER): Payer: 59 | Admitting: Rheumatology

## 2016-09-27 VITALS — BP 104/64 | HR 80 | Resp 14 | Ht 65.0 in | Wt 132.0 lb

## 2016-09-27 DIAGNOSIS — Z8739 Personal history of other diseases of the musculoskeletal system and connective tissue: Secondary | ICD-10-CM | POA: Diagnosis not present

## 2016-09-27 DIAGNOSIS — Z8639 Personal history of other endocrine, nutritional and metabolic disease: Secondary | ICD-10-CM

## 2016-09-27 DIAGNOSIS — M7062 Trochanteric bursitis, left hip: Secondary | ICD-10-CM | POA: Diagnosis not present

## 2016-09-27 DIAGNOSIS — M7061 Trochanteric bursitis, right hip: Secondary | ICD-10-CM | POA: Diagnosis not present

## 2016-09-27 DIAGNOSIS — M19042 Primary osteoarthritis, left hand: Secondary | ICD-10-CM | POA: Diagnosis not present

## 2016-09-27 DIAGNOSIS — M19041 Primary osteoarthritis, right hand: Secondary | ICD-10-CM | POA: Diagnosis not present

## 2016-09-27 DIAGNOSIS — Z87898 Personal history of other specified conditions: Secondary | ICD-10-CM

## 2016-09-27 DIAGNOSIS — R5383 Other fatigue: Secondary | ICD-10-CM | POA: Diagnosis not present

## 2016-09-27 DIAGNOSIS — Z8659 Personal history of other mental and behavioral disorders: Secondary | ICD-10-CM | POA: Diagnosis not present

## 2016-09-27 DIAGNOSIS — M19071 Primary osteoarthritis, right ankle and foot: Secondary | ICD-10-CM

## 2016-09-27 DIAGNOSIS — M19072 Primary osteoarthritis, left ankle and foot: Secondary | ICD-10-CM

## 2016-09-27 NOTE — Patient Instructions (Addendum)
Supplements for OA Natural anti-inflammatories  You can purchase these at [employer], [employer] or online.  . Turmeric (capsules)  . Ginger (ginger root or capsules)  . Omega 3 (Fish, flax seeds, chia seeds, walnuts, almonds)  . Tart cherry (dried or extract)   Patient should be under the care of a physician while taking these supplements. This may not be reproduced without the permission of Dr. Bo Merino. Iliotibial Bursitis Rehab Ask your health care provider which exercises are safe for you. Do exercises exactly as told by your health care provider and adjust them as directed. It is normal to feel mild stretching, pulling, tightness, or discomfort as you do these exercises, but you should stop right away if you feel sudden pain or your pain gets worse.Do not begin these exercises until told by your health care provider. Stretching and range of motion exercises These exercises warm up your muscles and joints and improve the movement and flexibility of your leg. These exercises also help to relieve pain and stiffness. Exercise A: Quadriceps stretch, prone   1. Lie on your abdomen on a firm surface, such as a bed or padded floor. 2. Bend your left / right knee and hold your ankle. If you cannot reach your ankle or pant leg, loop a belt around your foot and grab the belt instead. 3. Gently pull your heel toward your buttocks. Your knee should not slide out to the side. You should feel a stretch in the front of your thigh and knee. 4. Hold this position for __________ seconds. Repeat __________ times. Complete this exercise __________ times a day. Exercise B: Lunge (  adductor stretch) 1. Stand and spread your legs about 3 feet (about 1 m) apart. Put your left / right leg slightly back for balance. 2. Lean away from your left / right leg by bending your other knee and shifting your weight toward your bent knee. You may rest your hands on your thigh for balance. You should feel  a stretch in your left / right inner thigh. 3. Hold for __________ seconds. Repeat __________ times. Complete this exercise __________ times a day. Exercise C: Hamstring stretch, supine  1. Lie on your back. 2. Hold both ends of a belt or towel as you loop it over the ball of your left / right foot. The ball of your foot is on the walking surface, right under your toes. 3. Straighten your left / right knee and slowly pull on the belt to raise your leg. Stop when you feel a gentle stretch in the back of your left / right knee or thigh.  Do not let your left / right knee bend.  Keep your other leg flat on the floor. 4. Hold this position for __________ seconds. Repeat __________ times. Complete this exercise __________ times a day. Strengthening exercises These exercises build strength and endurance in your leg. Endurance is the ability to use your muscles for a long time, even after they get tired. Exercise D: Quadriceps wall slides  1. Lean your back against a smooth wall or door while you walk your feet out 18-24 inches (46-61 cm) from it. 2. Place your feet hip-width apart. 3. Slowly slide down the wall or door until your knees bend as far as told by your health care provider. Keep your knees over your heels, not your toes. Keep your knees in line with your hips. 4. Hold for __________ seconds. 5. Push through your heels to stand up to rest  for __________ seconds after each repetition. Repeat __________ times. Complete this exercise __________ times a day. Exercise E: Straight leg raises ( hip abductors) 1. Lie on your side, with your left / right leg in the top position. Lie so your head, shoulder, knee, and hip line up with each other. You may bend your bottom knee to help you balance. 2. Lift your top leg 4-6 inches (10-15 cm) while keeping your toes pointed straight ahead. 3. Hold this position for __________ seconds. 4. Slowly lower your leg to the starting position. Allow your  muscles to relax completely after each repetition. Repeat __________ times. Complete this exercise __________ times a day. Exercise F: Straight leg raises ( hip extensors) 1. Lie on your abdomen on a firm surface. You can put a pillow under your hips if that is more comfortable. 2. Tense the muscles in your buttocks and lift your left / right leg about 4-6 inches (10-15 cm). Keep your knee straight as you lift your leg. 3. Hold this position for __________ seconds. 4. Slowly lower your leg to the starting position. 5. Let your leg relax completely after each repetition. Repeat __________ times. Complete this exercise __________ times a day. Exercise G: Bridge ( hip extensors) 1. Lie on your back on a firm surface with your knees bent and your feet flat on the floor. 2. Tighten your buttocks muscles and lift your bottom off the floor until your trunk is level with your thighs.  Do not arch your back.  You should feel the muscles working in your buttocks and the back of your thighs. If you do not feel these muscles, slide your feet 1-2 inches (2.5-5 cm) farther away from your buttocks. 3. Hold this position for __________ seconds. 4. Slowly lower your hips to the starting position. 5. Let your buttocks muscles relax completely between repetitions. 6. If this exercise is too easy, try doing it with your arms crossed over your chest. Repeat __________ times. Complete this exercise __________ times a day. This information is not intended to replace advice given to you by your health care provider. Make sure you discuss any questions you have with your health care provider. Document Released: 05/06/2005 Document Revised: 01/11/2016 Document Reviewed: 04/18/2015 Elsevier Interactive Patient Education  2017 [employer].

## 2016-09-30 ENCOUNTER — Telehealth (INDEPENDENT_AMBULATORY_CARE_PROVIDER_SITE_OTHER): Payer: Self-pay | Admitting: Rheumatology

## 2016-09-30 NOTE — Telephone Encounter (Signed)
09/27/2016 OV NOTE FAXED TO DR. Jesse Fall [phone]

## 2016-10-03 ENCOUNTER — Telehealth: Payer: Self-pay | Admitting: Rheumatology

## 2016-10-03 NOTE — Telephone Encounter (Signed)
Erasmo Downer at [facility] called to follow up on referral status. I provided her with appointment date and time but she is requesting office note from the visit be faxed over so the referring doctor can review it.   Fax# [phone] attn: Erasmo Downer.

## 2016-10-03 NOTE — Telephone Encounter (Signed)
Sent office note to referring provider.

## 2016-12-20 ENCOUNTER — Ambulatory Visit (INDEPENDENT_AMBULATORY_CARE_PROVIDER_SITE_OTHER): Payer: 59 | Admitting: Neurology

## 2016-12-20 ENCOUNTER — Encounter: Payer: Self-pay | Admitting: Neurology

## 2016-12-20 VITALS — BP 116/73 | HR 93 | Ht 65.0 in | Wt 143.0 lb

## 2016-12-20 DIAGNOSIS — R413 Other amnesia: Secondary | ICD-10-CM

## 2016-12-20 DIAGNOSIS — M797 Fibromyalgia: Secondary | ICD-10-CM

## 2016-12-20 DIAGNOSIS — W57XXXD Bitten or stung by nonvenomous insect and other nonvenomous arthropods, subsequent encounter: Secondary | ICD-10-CM

## 2016-12-20 MED ORDER — GABAPENTIN 600 MG PO TABS: ORAL_TABLET | ORAL | 3 refills | Status: DC

## 2016-12-20 MED ORDER — AMITRIPTYLINE HCL 10 MG PO TABS: ORAL_TABLET | ORAL | 3 refills | Status: DC

## 2016-12-20 NOTE — Progress Notes (Signed)
Reason for visit: Fibromyalgia  Amanda Waters is an 61 y.o. female  History of present illness:  Amanda Waters is a 61 year old right-handed white female with a history of fibromyalgia. The patient has had some recent increase in discomfort. She has had more stress with her job, she is not exercising regularly and she is not sleeping well at night. The patient does take low-dose Ambien. Occasionally she may take a hydrocodone which does help but she does not want to take this frequently. She remains on gabapentin taking 600 mg 3 times daily. She has trigger points on the legs with increased discomfort. Exercise will increase the pain significantly. She has had a recent tick bite, she was placed on antibiotics for this. She returns for an evaluation.  Past Medical History:  Diagnosis Date  . Abnormal finding on MRI of brain 12/19/2014  . Chronic back pain   . Dyslipidemia   . Fibromyalgia   . GERD (gastroesophageal reflux disease)   . History of carpal tunnel syndrome   . History of hemorrhoids   . HSV-1 (herpes simplex virus 1) infection   . HSV-2 (herpes simplex virus 2) infection   . IBS (irritable bowel syndrome)   . 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 Iron deficiency anemia   . Kidney infection   . Kidney stones   . Left radial fracture    Radial head fracture  . Memory difficulty 12/16/2013  . Occipital neuralgia   . Osteopenia   . Swollen lymph nodes     Past Surgical History:  Procedure Laterality Date  . CHOLECYSTECTOMY    . CYSTECTOMY    . DILATION AND CURETTAGE OF UTERUS    . hammartoe surgery Left    2nd toe  . SHOULDER SURGERY    . TONSILLECTOMY      Family History  Problem Relation Age of Onset  . Fibromyalgia Mother   . Diabetes Father   . Thyroid cancer Father   . Heart disease Father   . Dementia Father     Social history:  reports that she has never smoked. She has never used smokeless tobacco. She reports  that she drinks alcohol. She reports that she does not use drugs.    Allergies  Allergen Reactions  . Celebrex [Celecoxib]   . Darvon [Propoxyphene]   . Duract [Bromfenac]   . Oxycodone-Acetaminophen   . Prednisone   . Sulfa Antibiotics     Medications:  Prior to Admission medications   Medication Sig Start Date End Date Taking? Authorizing Provider  b complex vitamins tablet Take 1 tablet by mouth daily.   Yes [provider]  Calcium Carbonate-Vitamin D (CALTRATE 600+D) 600-400 MG-UNIT per chew tablet Chew 1 tablet by mouth daily.   Yes [provider]  cyclobenzaprine (FLEXERIL) 10 MG tablet Take 10 mg by mouth 3 (three) times daily as needed.   Yes [provider]  diclofenac (FLECTOR) 1.3 % PTCH Place 1 patch onto the skin daily as needed.   Yes [provider]  ESTRACE VAGINAL 0.1 MG/GM vaginal cream Place 1 applicator vaginally once a week. 11/12/14  Yes [provider]  famciclovir (FAMVIR) 500 MG tablet Take 500 mg by mouth daily.   Yes [provider]  famotidine (PEPCID) 20 MG tablet Take 20 mg by mouth at bedtime.   Yes [provider]  gabapentin (NEURONTIN) 600 MG tablet TAKE 1 TABLET BY MOUTH TID 12/21/15  Yes  Kathrynn Ducking, MD  HYDROcodone-acetaminophen (NORCO/VICODIN) 5-325 MG per tablet Take 1 tablet by mouth every 6 (six) hours as needed for pain.   Yes [provider]  loratadine (CLARITIN) 10 MG tablet Take 10 mg by mouth daily as needed.    Yes [provider]  Nutritional Supplements (DHEA PO) Take 5 mg by mouth daily.   Yes [provider]  pantoprazole (PROTONIX) 40 MG tablet Take 40 mg by mouth daily. Patient takes every 3 days   Yes [provider]  vitamin E 400 UNIT capsule Take 400 Units by mouth daily.   Yes [provider]  zolpidem (AMBIEN) 5 MG tablet Take 1 tablet (5 mg total) by mouth at bedtime as needed. for sleep 12/21/15  Yes Kathrynn Ducking, MD    ROS:  Out of a complete 14 system review of symptoms, the patient complains only of the following symptoms, and all other reviewed systems are negative.  Muscle pain Insomnia  Blood pressure 120/80, height 5\' 5"  (1.651 m), weight 143 lb (64.9 kg).  Physical Exam  General: The patient is alert and cooperative at the time of the examination.  Skin: No significant peripheral edema is noted.   Neurologic Exam  Mental status: The patient is alert and oriented x 3 at the time of the examination. The patient has apparent normal recent and remote memory, with an apparently normal attention span and concentration ability.   Cranial nerves: Facial symmetry is present. Speech is normal, no aphasia or dysarthria is noted. Extraocular movements are full. Visual fields are full.  Motor: The patient has good strength in all 4 extremities.  Sensory examination: Soft touch sensation is symmetric on the face, arms, and legs.  Coordination: The patient has good finger-nose-finger and heel-to-shin bilaterally.  Gait and station: The patient has a normal gait. Tandem gait is normal. Romberg is negative. No drift is seen.  Reflexes: Deep tendon reflexes are symmetric.   Assessment/Plan:  1. Fibromyalgia  The patient will be sent for blood work to evaluate for Lyme disease, the patient did have a recent tick bite. The patient will be placed on amitriptyline taking 10 mg at night for a week and then go to 20 mg at night. She has been on this previously and this was helpful. She claims that Cymbalta did not help her in the past, and Lyrica offered no benefit over and above the gabapentin. The patient will follow-up in 6 months. She will call for any dose adjustments of the medication.   Jill Alexanders MD 12/20/2016 10:14 AM  [facility] [address] [place], [place] [address]  Phone [phone] Fax [phone]

## 2016-12-20 NOTE — Patient Instructions (Addendum)
    We will add amitriptyline at night for the fibromyalgia.   Elavil (amitriptyline) is an antidepressant medication that has many uses that may include headache, whiplash injuries, or for peripheral neuropathy pain. Side effects may include drowsiness, dry mouth, blurred vision, or constipation. As with any antidepressant medication, worsening depression may occur. If you had any significant side effects, please call our office. The full effects of this medication may take 7-10 days after starting the drug, or going up on the dose.

## 2016-12-24 ENCOUNTER — Telehealth: Payer: Self-pay | Admitting: *Deleted

## 2016-12-24 LAB — B. BURGDORFI ANTIBODIES

## 2016-12-24 NOTE — Telephone Encounter (Signed)
-----   Message from Kathrynn Ducking, MD sent at 12/24/2016  7:13 AM EDT -----   The blood work results are unremarkable. Please call the patient.  ----- Message ----- From: Lavone Neri Lab Results In Sent: 12/24/2016   5:40 AM To: Kathrynn Ducking, MD

## 2016-12-24 NOTE — Telephone Encounter (Signed)
Called and spoke with patient about unremarkable labs per CW,MD note. Pt verbalized understanding.  

## 2017-01-13 ENCOUNTER — Other Ambulatory Visit: Payer: Self-pay | Admitting: Neurology

## 2017-04-08 ENCOUNTER — Encounter: Payer: Self-pay | Admitting: Gastroenterology

## 2017-05-16 ENCOUNTER — Emergency Department (HOSPITAL_BASED_OUTPATIENT_CLINIC_OR_DEPARTMENT_OTHER)
Admission: EM | Admit: 2017-05-16 | Discharge: 2017-05-16 | Disposition: A | Discharge status: Home / Self Care | Payer: 59 | Attending: Emergency Medicine | Admitting: Emergency Medicine

## 2017-05-16 ENCOUNTER — Other Ambulatory Visit: Payer: Self-pay

## 2017-05-16 ENCOUNTER — Encounter (HOSPITAL_BASED_OUTPATIENT_CLINIC_OR_DEPARTMENT_OTHER): Payer: Self-pay | Admitting: Emergency Medicine

## 2017-05-16 ENCOUNTER — Emergency Department (HOSPITAL_BASED_OUTPATIENT_CLINIC_OR_DEPARTMENT_OTHER): Payer: 59

## 2017-05-16 DIAGNOSIS — R1084 Generalized abdominal pain: Secondary | ICD-10-CM | POA: Diagnosis not present

## 2017-05-16 DIAGNOSIS — R0789 Other chest pain: Secondary | ICD-10-CM | POA: Diagnosis not present

## 2017-05-16 DIAGNOSIS — R197 Diarrhea, unspecified: Secondary | ICD-10-CM | POA: Insufficient documentation

## 2017-05-16 DIAGNOSIS — E860 Dehydration: Secondary | ICD-10-CM

## 2017-05-16 DIAGNOSIS — Z79899 Other long term (current) drug therapy: Secondary | ICD-10-CM | POA: Diagnosis not present

## 2017-05-16 DIAGNOSIS — R112 Nausea with vomiting, unspecified: Secondary | ICD-10-CM | POA: Diagnosis not present

## 2017-05-16 LAB — COMPREHENSIVE METABOLIC PANEL
ALBUMIN: 4.3 g/dL (ref 3.5–5.0)
ALK PHOS: 52 U/L (ref 38–126)
ALT: 31 U/L (ref 14–54)
AST: 32 U/L (ref 15–41)
Anion gap: 11 (ref 5–15)
BILIRUBIN TOTAL: 1.7 mg/dL — AB (ref 0.3–1.2)
BUN: 17 mg/dL (ref 6–20)
CALCIUM: 8.9 mg/dL (ref 8.9–10.3)
CO2: 22 mmol/L (ref 22–32)
Chloride: 104 mmol/L (ref 101–111)
Creatinine, Ser: 0.87 mg/dL (ref 0.44–1.00)
GFR calc Af Amer: >60 mL/min (ref >60)
GFR calc non Af Amer: >60 mL/min (ref >60)
GLUCOSE: 108 mg/dL — AB (ref 65–99)
Potassium: 3 mmol/L — ABNORMAL LOW (ref 3.5–5.1)
Sodium: 137 mmol/L (ref 135–145)
TOTAL PROTEIN: 7.5 g/dL (ref 6.5–8.1)

## 2017-05-16 LAB — CBC WITH DIFFERENTIAL/PLATELET
BASOS PCT: 0 %
Basophils Absolute: 0 10*3/uL (ref 0.0–0.1)
EOS ABS: 0 10*3/uL (ref 0.0–0.7)
Eosinophils Relative: 0 %
HEMATOCRIT: 38.4 % (ref 36.0–46.0)
Hemoglobin: 12.9 g/dL (ref 12.0–15.0)
Lymphocytes Relative: 16 %
Lymphs Abs: 1.1 10*3/uL (ref 0.7–4.0)
MCH: 25.9 pg — AB (ref 26.0–34.0)
MCHC: 33.6 g/dL (ref 30.0–36.0)
MCV: 77.1 fL — ABNORMAL LOW (ref 78.0–100.0)
MONO ABS: 0.9 10*3/uL (ref 0.1–1.0)
MONOS PCT: 13 %
Neutro Abs: 4.6 10*3/uL (ref 1.7–7.7)
Neutrophils Relative %: 71 %
Platelets: 286 10*3/uL (ref 150–400)
RBC: 4.98 MIL/uL (ref 3.87–5.11)
RDW: 14.9 % (ref 11.5–15.5)
WBC: 6.5 10*3/uL (ref 4.0–10.5)

## 2017-05-16 LAB — URINALYSIS, ROUTINE W REFLEX MICROSCOPIC
BILIRUBIN URINE: NEGATIVE
Glucose, UA: NEGATIVE mg/dL
KETONES UR: 15 mg/dL — AB
Leukocytes, UA: NEGATIVE
NITRITE: NEGATIVE
PH: 6 (ref 5.0–8.0)
Protein, ur: NEGATIVE mg/dL
Specific Gravity, Urine: <1.005 — ABNORMAL LOW (ref 1.005–1.030)

## 2017-05-16 LAB — URINALYSIS, MICROSCOPIC (REFLEX)

## 2017-05-16 LAB — TROPONIN I: Troponin I: <0.03 ng/mL (ref <0.03)

## 2017-05-16 LAB — LIPASE, BLOOD: Lipase: 28 U/L (ref 11–51)

## 2017-05-16 MED ORDER — DIPHENHYDRAMINE HCL 50 MG/ML IJ SOLN
12.5000 mg | Freq: Once | INTRAMUSCULAR | Status: AC
  Administered 2017-05-16: 12.5 mg via INTRAVENOUS
  Filled 2017-05-16: qty 1

## 2017-05-16 MED ORDER — PANTOPRAZOLE SODIUM 40 MG IV SOLR
40.0000 mg | Freq: Once | INTRAVENOUS | Status: AC
  Administered 2017-05-16: 40 mg via INTRAVENOUS
  Filled 2017-05-16: qty 40

## 2017-05-16 MED ORDER — DICYCLOMINE HCL 20 MG PO TABS: 20.0000 mg | ORAL_TABLET | Freq: Two times a day (BID) | ORAL | 0 refills | Status: DC

## 2017-05-16 MED ORDER — POTASSIUM CHLORIDE CRYS ER 20 MEQ PO TBCR: 20.0000 meq | EXTENDED_RELEASE_TABLET | Freq: Two times a day (BID) | ORAL | 0 refills | Status: DC

## 2017-05-16 MED ORDER — GI COCKTAIL ~~LOC~~
30.0000 mL | Freq: Once | ORAL | Status: AC
  Administered 2017-05-16: 30 mL via ORAL
  Filled 2017-05-16: qty 30

## 2017-05-16 MED ORDER — POTASSIUM CHLORIDE CRYS ER 20 MEQ PO TBCR
40.0000 meq | EXTENDED_RELEASE_TABLET | Freq: Once | ORAL | Status: AC
  Administered 2017-05-16: 40 meq via ORAL
  Filled 2017-05-16: qty 2

## 2017-05-16 MED ORDER — SODIUM CHLORIDE 0.9 % IV BOLUS (SEPSIS): 500.0000 mL | Freq: Once | INTRAVENOUS | Status: DC

## 2017-05-16 MED ORDER — METOCLOPRAMIDE HCL 5 MG/ML IJ SOLN
10.0000 mg | Freq: Once | INTRAMUSCULAR | Status: AC
  Administered 2017-05-16: 10 mg via INTRAVENOUS
  Filled 2017-05-16: qty 2

## 2017-05-16 MED ORDER — SODIUM CHLORIDE 0.9 % IV BOLUS (SEPSIS)
1000.0000 mL | Freq: Once | INTRAVENOUS | Status: AC
  Administered 2017-05-16: 1000 mL via INTRAVENOUS

## 2017-05-16 MED ORDER — FAMOTIDINE IN NACL 20-0.9 MG/50ML-% IV SOLN
20.0000 mg | Freq: Once | INTRAVENOUS | Status: AC
  Administered 2017-05-16: 20 mg via INTRAVENOUS
  Filled 2017-05-16: qty 50

## 2017-05-16 MED ORDER — CIPROFLOXACIN HCL 500 MG PO TABS: 500.0000 mg | ORAL_TABLET | Freq: Two times a day (BID) | ORAL | 0 refills | Status: DC

## 2017-05-16 MED ORDER — METRONIDAZOLE 500 MG PO TABS: 500.0000 mg | ORAL_TABLET | Freq: Three times a day (TID) | ORAL | 0 refills | Status: DC

## 2017-05-16 MED ORDER — DICYCLOMINE HCL 10 MG/ML IM SOLN
20.0000 mg | Freq: Once | INTRAMUSCULAR | Status: AC
  Administered 2017-05-16: 20 mg via INTRAMUSCULAR
  Filled 2017-05-16: qty 2

## 2017-05-16 MED FILL — POTASSIUM CL ER 20 MEQ TABL: 20 | 3 days supply | Qty: 6 | Fill #0

## 2017-05-16 MED FILL — DICYCLOMINE 20 MG TABLET: 20 | 10 days supply | Qty: 20 | Fill #0

## 2017-05-16 NOTE — ED Notes (Signed)
Patient is tolerating fluids well.

## 2017-05-16 NOTE — Discharge Instructions (Signed)
Your abdominal pain is likely from gastritis, reflux or a stomach ulcer. You will need to take the prescribed proton pump inhibitor as directed, and avoid spicy/fatty/acidic foods. Avoid laying down flat within 30 minutes of eating. Avoid NSAIDs like ibuprofen or Aleve on an empty stomach. Use phenergan as needed for nausea. Follow up with the gastroenterologist (GI doctor) listed for ongoing evaluation of your abdominal pain. Return to the ER for new or worsening symptoms, any additional concers.  This is likely a viral illness however have given you a prescription for antibiotics to take if your symptoms last for the next 2-3 days.  I would not take antibiotics unless your symptoms worsen or do not improve the next 2-3 days.  Take the Bentyl for abdominal cramps.  Continue taking her Protonix and the Pepcid for acid reflux.   SEEK IMMEDIATE MEDICAL ATTENTION IF YOU DEVELOP ANY OF THE FOLLOWING SYMPTOMS: The pain does not go away or becomes severe.  A temperature above 101 develops.  Repeated vomiting occurs (multiple episodes).  Blood is being passed in stools or vomit (bright red or black tarry stools).  Return also if you develop chest pain, difficulty breathing, dizziness or fainting

## 2017-05-16 NOTE — ED Triage Notes (Signed)
Patient states that she has had n/v/D for the last "30" hours. The patient states that she is now having some cramping in her lower gut and a burning from her stomach up into her chest

## 2017-05-16 NOTE — ED Provider Notes (Signed)
[facility] EMERGENCY DEPARTMENT Provider Note   CSN: [id number] Arrival date & time: 05/16/17  8563     History   Chief Complaint Chief Complaint  Patient presents with  . Emesis    HPI Amanda Waters is a 61 y.o. female.  HPI 61 year old with past medical history significant for fibromyalgia, GERD, chronic back pain, occipital neuralgia that presents to the emergency department today for evaluation of nausea, vomiting, diarrhea and generalized abdominal cramping.  Patient states that for the past 2 days she has been having a significant amount of loose watery stool and not nonbloody bilious emesis.  Patient states that this all started after she visited family last week who had a sick child with a viral GI illness.  She states that she has thrown up several times for the past 2 days.  Reports decreased p.o. intake due to the nausea, vomiting, diarrhea.  The patient states that today she started developing lower abdominal cramping.  Patient states that she does have some burning in her chest that is likely from throwing up as well.  Patient denies any associated shortness of breath.  She does report intermittent fevers at home with T-max being 101 last night.  She denies any bloody stools.  Denies any blood in her vomit.  Patient has taken "a Advil" for her fever.  Denies any associated urinary symptoms, vaginal symptoms.  Patient is not taking any medications for her symptoms.  Patient denies any recent travel, recent antibiotic use.  Pt denies any fever, chill, ha, vision changes, lightheadedness, dizziness, congestion, neck pain, sob, cough, urinary symptoms, melena, hematochezia, lower extremity paresthesias.  Past Medical History:  Diagnosis Date  . Abnormal finding on MRI of brain 12/19/2014  . Chronic back pain   . Dyslipidemia   . Fibromyalgia   . GERD (gastroesophageal reflux disease)   . History of carpal tunnel syndrome   . History of hemorrhoids   . HSV-1  (herpes simplex virus 1) infection   . HSV-2 (herpes simplex virus 2) infection   . IBS (irritable bowel syndrome)   . 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 Iron deficiency anemia   . Kidney infection   . Kidney stones   . Left radial fracture    Radial head fracture  . Memory difficulty 12/16/2013  . Occipital neuralgia   . Osteopenia   . Swollen lymph nodes     Patient Active Problem List   Diagnosis Date Noted  . Multiple joint pain 09/26/2016  . Other fatigue 09/26/2016  . Myalgia 09/26/2016  . Abnormal finding on MRI of brain 12/19/2014  . Memory difficulty 12/16/2013  . Fibromyalgia 12/16/2012  . Other nonspecific abnormal result of function study of brain and central nervous system 04/07/2012  . Disturbance of skin sensation 04/07/2012  . Headache(784.0) 04/07/2012    Past Surgical History:  Procedure Laterality Date  . CHOLECYSTECTOMY    . CYSTECTOMY    . DILATION AND CURETTAGE OF UTERUS    . hammartoe surgery Left    2nd toe  . SHOULDER SURGERY    . TONSILLECTOMY      OB History    No data available       Home Medications    Prior to Admission medications   Medication Sig Start Date End Date Taking? Authorizing Provider  amitriptyline (ELAVIL) 10 MG tablet Take one tablet at night for one week, then take 2 tablets at night 12/20/16   Jannifer Franklin,  Elon Alas, MD  b complex vitamins tablet Take 1 tablet by mouth daily.    [provider]  Calcium Carbonate-Vitamin D (CALTRATE 600+D) 600-400 MG-UNIT per chew tablet Chew 1 tablet by mouth daily.    [provider]  cyclobenzaprine (FLEXERIL) 10 MG tablet Take 10 mg by mouth 3 (three) times daily as needed.    [provider]  diclofenac (FLECTOR) 1.3 % PTCH Place 1 patch onto the skin daily as needed.    [provider]  ESTRACE VAGINAL 0.1 MG/GM vaginal cream Place 1 applicator vaginally once a week. 11/12/14   [provider]    famciclovir (FAMVIR) 500 MG tablet Take 500 mg by mouth daily.    [provider]  famotidine (PEPCID) 20 MG tablet Take 20 mg by mouth at bedtime.    [provider]  gabapentin (NEURONTIN) 600 MG tablet TAKE 1 TABLET BY MOUTH TID 12/20/16   Kathrynn Ducking, MD  HYDROcodone-acetaminophen (NORCO/VICODIN) 5-325 MG per tablet Take 1 tablet by mouth every 6 (six) hours as needed for pain.    [provider]  loratadine (CLARITIN) 10 MG tablet Take 10 mg by mouth daily as needed.     [provider]  Nutritional Supplements (DHEA PO) Take 5 mg by mouth daily.    [provider]  pantoprazole (PROTONIX) 40 MG tablet Take 40 mg by mouth daily. Patient takes every 3 days    [provider]  vitamin E 400 UNIT capsule Take 400 Units by mouth daily.    [provider]  zolpidem (AMBIEN) 5 MG tablet Take 1 tablet (5 mg total) by mouth at bedtime as needed. for sleep 12/21/15   Kathrynn Ducking, MD    Family History Family History  Problem Relation Age of Onset  . Fibromyalgia Mother   . Diabetes Father   . Thyroid cancer Father   . Heart disease Father   . Dementia Father     Social History Social History   Tobacco Use  . Smoking status: Never Smoker  . Smokeless tobacco: Never Used  Substance Use Topics  . Alcohol use: Yes    Comment: Consumes alcohol on occasion  . Drug use: No     Allergies   Celebrex [celecoxib]; Darvon [propoxyphene]; Duract [bromfenac]; Nitrofuran derivatives; Oxycodone-acetaminophen; Prednisone; and Sulfa antibiotics   Review of Systems Review of Systems  Constitutional: Negative for chills and fever.  HENT: Negative for congestion.   Eyes: Negative for visual disturbance.  Respiratory: Negative for cough and shortness of breath.   Cardiovascular: Positive for chest pain (burning).  Gastrointestinal: Positive for abdominal pain, diarrhea, nausea and vomiting. Negative for blood in stool and  constipation.  Genitourinary: Negative for dysuria, flank pain, frequency, hematuria and urgency.  Musculoskeletal: Negative for arthralgias and myalgias.  Skin: Negative for rash.  Neurological: Negative for dizziness, syncope, weakness, light-headedness, numbness and headaches.  Psychiatric/Behavioral: Negative for sleep disturbance. The patient is not nervous/anxious.      Physical Exam Updated Vital Signs BP (!) 138/91 (BP Location: Left Arm)   Pulse 82   Temp 97.8 F (36.6 C) (Oral)   Resp (!) 22   Ht 5\' 5"  (1.651 m)   Wt 63.5 kg (140 lb)   SpO2 100%   BMI 23.30 kg/m   Physical Exam  Constitutional: She is oriented to person, place, and time. She appears well-developed and well-nourished.  Non-toxic appearance. No distress.  HENT:  Head: Normocephalic and atraumatic.  Nose: Nose  normal.  Mouth/Throat: Oropharynx is clear and moist.  Eyes: Conjunctivae are normal. Pupils are equal, round, and reactive to light. Right eye exhibits no discharge. Left eye exhibits no discharge.  Neck: Normal range of motion. Neck supple.  Cardiovascular: Normal rate, regular rhythm, normal heart sounds and intact distal pulses. Exam reveals no gallop and no friction rub.  No murmur heard. Pulmonary/Chest: Effort normal and breath sounds normal. No stridor. No respiratory distress. She has no wheezes. She has no rales. She exhibits tenderness.  Abdominal: Soft. Bowel sounds are increased. There is generalized tenderness. There is no rigidity, no rebound, no guarding, no CVA tenderness, no tenderness at McBurney's point and negative Murphy's sign.  Musculoskeletal: Normal range of motion. She exhibits no tenderness.  Lymphadenopathy:    She has no cervical adenopathy.  Neurological: She is alert and oriented to person, place, and time.  Skin: Skin is warm and dry. Capillary refill takes less than 2 seconds.  Psychiatric: Her behavior is normal. Judgment and thought content normal.  Nursing note  and vitals reviewed.    ED Treatments / Results  Labs (all labs ordered are listed, but only abnormal results are displayed) Labs Reviewed  COMPREHENSIVE METABOLIC PANEL - Abnormal; Notable for the following components:      Result Value   Potassium 3.0 (*)    Glucose, Bld 108 (*)    Total Bilirubin 1.7 (*)    All other components within normal limits  CBC WITH DIFFERENTIAL/PLATELET - Abnormal; Notable for the following components:   MCV 77.1 (*)    MCH 25.9 (*)    All other components within normal limits  URINALYSIS, ROUTINE W REFLEX MICROSCOPIC - Abnormal; Notable for the following components:   Specific Gravity, Urine <1.005 (*)    Hgb urine dipstick TRACE (*)    Ketones, ur 15 (*)    All other components within normal limits  URINALYSIS, MICROSCOPIC (REFLEX) - Abnormal; Notable for the following components:   Bacteria, UA FEW (*)    Squamous Epithelial / LPF 0-5 (*)    All other components within normal limits  LIPASE, BLOOD  TROPONIN I    EKG  EKG Interpretation None       Radiology No results found.  Procedures Procedures (including critical care time)  Medications Ordered in ED Medications  sodium chloride 0.9 % bolus 500 mL (500 mLs Intravenous Not Given 05/16/17 1222)  sodium chloride 0.9 % bolus 1,000 mL (0 mLs Intravenous Stopped 05/16/17 1206)  metoCLOPramide (REGLAN) injection 10 mg (10 mg Intravenous Given 05/16/17 1011)  diphenhydrAMINE (BENADRYL) injection 12.5 mg (12.5 mg Intravenous Given 05/16/17 1011)  famotidine (PEPCID) IVPB 20 mg premix (0 mg Intravenous Stopped 05/16/17 1151)  gi cocktail (Maalox,Lidocaine,Donnatal) (30 mLs Oral Given 05/16/17 1013)  dicyclomine (BENTYL) injection 20 mg (20 mg Intramuscular Given 05/16/17 1011)  pantoprazole (PROTONIX) injection 40 mg (40 mg Intravenous Given 05/16/17 1012)  potassium chloride SA (K-DUR,KLOR-CON) CR tablet 40 mEq (40 mEq Oral Given 05/16/17 1206)     Initial Impression / Assessment  and Plan / ED Course  I have reviewed the triage vital signs and the nursing notes.  Pertinent labs & imaging results that were available during my care of the patient were reviewed by me and considered in my medical decision making (see chart for details).     Patient presents the ED for evaluation of nausea, vomiting, diarrhea with generalized abdominal cramping and chest burning sensation.  The patient states that her symptoms started approximately 2  days ago after coming into contact with her nephew that had a viral GI illness.  Patient has had several episodes of nonbloody nonbilious emesis during the past 2 days.  She was prescribed a suppository Phenergan by her primary care doctor that has helped her nausea but her diarrhea persists.  Patient also reports some generalized abdominal cramping and burning in her chest due to vomiting.  On exam patient is overall well-appearing and nontoxic.  Vital signs are very reassuring.  Patient is afebrile, no tachycardia, no hypotension noted.  Patient has no focal abdominal tenderness to palpation and no signs of peritonitis.  Lungs clear to auscultation bilaterally.  Heart regular rate and rhythm with no rubs murmurs or gallops.  CVA tenderness noted.  Lab work has been very reassuring.  No leukocytosis.  Mild hypokalemia consistent with diarrhea and vomiting which was replaced with oral potassium and patient be discharged home with several days of p.o. potassium until recheck with her primary care doctor.  LFTs are normal.  Normal lipase.  UA shows no signs of infection.  Patient did report some chest burning sensation from vomiting.  Troponin was negative.  EKG shows sinus rhythm.  Very atypical for ACS.   Patient was treated with a GI cocktail, Pepcid, Reglan and Bentyl.  Patient feels much improved after this and states that her symptoms have completely resolved.  Unable to perform CT scan of the ED today however patient has no focal abdominal  tenderness and do not feel that patient needs emergent transfer to area facility for CT scan.  This is likely a viral enteritis.  Acute abdominal x-ray and chest was performed that does not show any signs of obstruction or peritoneal air.  Chest x-ray was normal.  Patient has been able to tolerate p.o. fluids in the ED.  She is been given several liters of fluid in the ED.  She feels much improved ready for discharge.  Repeat abdominal exam is benign.  Patient does not meet any Sirs or sepsis criteria.  I discussed symptomatic treatment at home.  I again I think this is a viral illness however will give patient a prescription for antibiotics.  Instructed patient to take this if her symptoms last for the next 2-3 days but would hold off at this time as this is likely a viral.  Patient has no signs of UTI, pyelonephritis.  Doubt appendicitis or obstruction.  Low suspicion for diverticulitis.  The patient is status post cholecystectomy.  Pt is hemodynamically stable, in NAD, & able to ambulate in the ED. Evaluation does not show pathology that would require ongoing emergent intervention or inpatient treatment. I explained the diagnosis to the patient. Pain has been managed & has no complaints prior to dc. Pt is comfortable with above plan and is stable for discharge at this time. All questions were answered prior to disposition. Strict return precautions for f/u to the ED were discussed. Encouraged follow up with PCP.  Pt dicussed with Dr. Wilson Singer who is agreeable with the above plan.   Final Clinical Impressions(s) / ED Diagnoses   Final diagnoses:  Nausea vomiting and diarrhea  Dehydration  Generalized abdominal pain    ED Discharge Orders        Ordered    dicyclomine (BENTYL) 20 MG tablet  2 times daily     05/16/17 1253    potassium chloride SA (K-DUR,KLOR-CON) 20 MEQ tablet  2 times daily     05/16/17 1253    ciprofloxacin (CIPRO)  500 MG tablet  2 times daily     05/16/17 1253     metroNIDAZOLE (FLAGYL) 500 MG tablet  3 times daily     05/16/17 1253       Aaron Edelman 05/16/17 1300    Virgel Manifold, MD 05/16/17 1539

## 2017-06-02 ENCOUNTER — Ambulatory Visit: Payer: Self-pay | Admitting: Gastroenterology

## 2017-06-23 ENCOUNTER — Ambulatory Visit: Payer: 59 | Admitting: Neurology

## 2017-07-10 ENCOUNTER — Encounter: Payer: Self-pay | Admitting: Gastroenterology

## 2017-07-10 ENCOUNTER — Ambulatory Visit (INDEPENDENT_AMBULATORY_CARE_PROVIDER_SITE_OTHER): Payer: 59 | Admitting: Gastroenterology

## 2017-07-10 VITALS — BP 120/68 | HR 80 | Ht 65.0 in | Wt 143.8 lb

## 2017-07-10 DIAGNOSIS — K219 Gastro-esophageal reflux disease without esophagitis: Secondary | ICD-10-CM | POA: Diagnosis not present

## 2017-07-10 DIAGNOSIS — K9041 Non-celiac gluten sensitivity: Secondary | ICD-10-CM | POA: Diagnosis not present

## 2017-07-10 DIAGNOSIS — Z86010 Personal history of colonic polyps: Secondary | ICD-10-CM | POA: Diagnosis not present

## 2017-07-10 DIAGNOSIS — R079 Chest pain, unspecified: Secondary | ICD-10-CM

## 2017-07-10 MED ORDER — PANTOPRAZOLE SODIUM 40 MG PO TBEC: 40.0000 mg | DELAYED_RELEASE_TABLET | Freq: Every day | ORAL | 11 refills | Status: DC

## 2017-07-10 NOTE — Patient Instructions (Signed)
We have sent the following medications to your pharmacy for you to pick up at your convenience: pantoprazole.   You will be due for a recall colonoscopy/upper endoscopy in 10/2017. We will send you a reminder in the mail when it gets closer to that time.  Patient advised to avoid spicy, acidic, citrus, chocolate, mints, fruit and fruit juices.  Limit the intake of caffeine, alcohol and Soda.  Don't exercise too soon after eating.  Don't lie down within 3-4 hours of eating.  Elevate the head of your bed.  Thank you for choosing me and [facility].  Pricilla Riffle. Dagoberto Ligas., MD., Marval Regal

## 2017-07-10 NOTE — Progress Notes (Signed)
History of Present Illness: This is a 62 year old female self referred for the evaluation of GERD, IBS, personal history of adenomatous colon polyps.  She relates problems with midsternal chest pain occurring approximately once per week that she feels is related to reflux symptoms.  Symptoms were controlled with a GI cocktail at a recent ED visit.  Symptoms generally respond to the use of over-the-counter Prilosec within a few days.  She does not take antireflux medications regularly.  She relates problems with gluten intolerance she does not have a history of being tested for celiac disease and there is no family history of celiac disease.  She states she strictly avoids gluten as even small amounts lead to significant abdominal bloating.  She has had microcytosis noted on several CBCs. Prior evaluation in [facility] outlined below.  No other GI complaints. Denies weight loss, abdominal pain, constipation, diarrhea, change in stool caliber, melena, hematochezia, nausea, vomiting, dysphagia.   * CT Abdomen/Pelvis 12/16/14 found: "1. No evidence of acute appendicitis. 2. Small scattered mesenteric lymph nodes are nonspecific but may be consistent with enteritis. No evidence of bowel obstruction. 3. Benign-appearing parapelvic cysts of the left kidney." * EGD/colonoscopy with Dr. Valli Glance June 2014. EGD found gastric polyps (fundic gland polyps on pathology). Colonoscopy found mild diverticulosis and internal hemorrhoids. He had recommended a repeat in 3 years due to her previous history of polyps and her family history * Colonoscopy August 2011 with Dr. Valli Glance found internal hemorrhoids and marginal prep * EGD October 2008 with Dr. Gilford Rile found a 2 cm hiatal hernia and moderate gastritis. Esophagus biopsies were negative for EoE. Stomach biopsies were negative for H. Pylori. * EGD/colonoscopy May 2006 with Dr. Gilford Rile. EGD found mild esophagitis, a 1 cm hiatal hernia, and mild gastritis. Colonoscopy found  a ~3-4 mm adenomatous polyp at 15 cm and internal hemorrhoids   Review of Systems: Pertinent positive and negative review of systems were noted in the above HPI section. All other review of systems were otherwise negative.   Physical Exam: General: Well developed, well nourished, no acute distress Head: Normocephalic and atraumatic Eyes:  sclerae anicteric, EOMI Ears: Normal auditory acuity Mouth: No deformity or lesions Neck: Supple, no masses or thyromegaly Lungs: Clear throughout to auscultation Heart: Regular rate and rhythm; no murmurs, rubs or bruits Abdomen: Soft, mild epigastric tenderness and non distended. No masses, hepatosplenomegaly or hernias noted. Normal Bowel sounds Rectal: Deferred to colonoscopy Musculoskeletal: Symmetrical with no gross deformities  Skin: No lesions on visible extremities Pulses:  Normal pulses noted Extremities: No clubbing, cyanosis, edema or deformities noted Neurological: Alert oriented x 4, grossly nonfocal Cervical Nodes:  No significant cervical adenopathy Inguinal Nodes: No significant inguinal adenopathy Psychological:  Alert and cooperative. Normal mood and affect  Assessment and Recommendations:  1.  Personal history of adenomatous colon polyps.  A 5-year interval colonoscopy is due in June and she would like to schedule in May or June.  Her husband currently has a driving restriction that is expected to be lifted within the next few weeks. Schedule colonoscopy when convenient for the patient. The risks (including bleeding, perforation, infection, missed lesions, medication reactions and possible hospitalization or surgery if complications occur), benefits, and alternatives to colonoscopy with possible biopsy and possible polypectomy were discussed with the patient and they consent to proceed.   2.  Suspected GERD leading to chest pain.  Closely following standard antireflux measures.  Resume pantoprazole 40 mg daily, not as needed, for 2  months, and assess response TUMS as needed. Schedule EGD at the time of colonoscopy. The risks (including bleeding, perforation, infection, missed lesions, medication reactions and possible hospitalization or surgery if complications occur), benefits, and alternatives to endoscopy with possible biopsy and possible dilation were discussed with the patient and they consent to proceed.   3. Microcytosis without anemia.  Further evaluation per PCP.  4.  Gluten intolerance.  Cannot exclude celiac disease however we can obtain biopsies at the time of her EGD and she is aware that pathology could be negative if she is strictly avoiding gluten even if she does have celiac disease.  Offered to proceed with blood work and she declines chance of a false negative is high and as the value of proceeding with the test is low because she plans to continue to avoid gluten.

## 2017-09-25 ENCOUNTER — Telehealth: Payer: Self-pay | Admitting: Gastroenterology

## 2017-09-25 NOTE — Telephone Encounter (Signed)
Patient states she needs to have procedure done in June due to ins purposes and wants to know if she could slip procedure up and do them separate due to Dr.Stark not having any 60 min slots open for procedures in June.

## 2017-09-25 NOTE — Telephone Encounter (Signed)
Certainly ok to have her colonoscopy and EGD on separate days.

## 2017-09-25 NOTE — Telephone Encounter (Signed)
Pt due for ECL recall and states she needs to have the procedures done in June due to insurance. Pt wants to know if she can do the procedures on separate days as there are not any 4min slots available. Please advise.

## 2017-09-26 NOTE — Telephone Encounter (Signed)
See note from Dr. Fuller Plan below.

## 2017-09-26 NOTE — Telephone Encounter (Signed)
Left message on voicemail for patient to call back and schedule procedures.

## 2017-09-30 ENCOUNTER — Other Ambulatory Visit: Payer: Self-pay

## 2017-09-30 ENCOUNTER — Telehealth: Payer: Self-pay | Admitting: Neurology

## 2017-09-30 ENCOUNTER — Ambulatory Visit (INDEPENDENT_AMBULATORY_CARE_PROVIDER_SITE_OTHER): Payer: 59 | Admitting: Neurology

## 2017-09-30 ENCOUNTER — Encounter: Payer: Self-pay | Admitting: Neurology

## 2017-09-30 VITALS — BP 110/70 | HR 102 | Ht 65.0 in | Wt 144.0 lb

## 2017-09-30 DIAGNOSIS — R413 Other amnesia: Secondary | ICD-10-CM

## 2017-09-30 DIAGNOSIS — M797 Fibromyalgia: Secondary | ICD-10-CM | POA: Diagnosis not present

## 2017-09-30 NOTE — Progress Notes (Signed)
Reason for visit: Fibromyalgia, memory disorder  Amanda Waters is a 62 y.o. female  History of present illness:  Ms. Marinez is a 62 year old right-handed white female with a history of fibromyalgia.  The patient was given amitriptyline when last seen, she stopped the medication as it was not beneficial, she remains on gabapentin taking 600 mg 3 times daily.  Cymbalta and Lyrica have not helped much in the past.  The patient has hydrocodone to take as well.  She uses Ambien at night for sleep.  She reports some increasing problems with memory, she has difficulty remembering names for people and she may substitute words that are incorrect in the middle of a sentence.  She has pulled away from doing public speaking.  She still works, but she has to take a lot of notes, she works independently, not with coworkers.  The patient indicates that her father has Alzheimer's disease and mini strokes.  The patient does sleep fairly well at night.  She takes Flexeril only when she has a flareup of low back pain, she is not very active otherwise.  She returns to this office for an evaluation.   Past Medical History:  Diagnosis Date  . Abnormal finding on MRI of brain 12/19/2014  . Adenomatous polyp of colon 2006  . Chronic back pain   . Dyslipidemia   . Fibromyalgia   . GERD (gastroesophageal reflux disease)   . History of carpal tunnel syndrome   . History of hemorrhoids   . HSV-1 (herpes simplex virus 1) infection   . HSV-2 (herpes simplex virus 2) infection   . IBS (irritable bowel syndrome)   . 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 Iron deficiency anemia   . Kidney infection   . Kidney stones   . Left radial fracture    Radial head fracture  . Memory difficulty 12/16/2013  . Occipital neuralgia   . Osteopenia   . Swollen lymph nodes     Past Surgical History:  Procedure Laterality Date  . CHOLECYSTECTOMY    . CYSTECTOMY    . DILATION AND  CURETTAGE OF UTERUS    . hammartoe surgery Left    2nd toe  . SHOULDER SURGERY    . TONSILLECTOMY      Family History  Problem Relation Age of Onset  . Fibromyalgia Mother   . Diabetes Father   . Thyroid cancer Father   . Heart disease Father   . Dementia Father     Social history:  reports that she has never smoked. She has never used smokeless tobacco. She reports that she drinks alcohol. She reports that she does not use drugs.    Allergies  Allergen Reactions  . Celebrex [Celecoxib]   . Darvon [Propoxyphene]   . Duract [Bromfenac]   . Nitrofuran Derivatives     Headache   . Oxycodone-Acetaminophen   . Prednisone   . Sulfa Antibiotics     Medications:  Prior to Admission medications   Medication Sig Start Date End Date Taking? Authorizing Provider  b complex vitamins tablet Take 1 tablet by mouth daily.   Yes [provider]  Calcium Carbonate-Vitamin D (CALTRATE 600+D) 600-400 MG-UNIT per chew tablet Chew 1 tablet by mouth daily.   Yes [provider]  cyclobenzaprine (FLEXERIL) 10 MG tablet Take 10 mg by mouth 3 (three) times daily as needed.   Yes [provider]  diclofenac (FLECTOR) 1.3 % PTCH Place 1  patch onto the skin daily as needed.   Yes [provider]  famciclovir (FAMVIR) 500 MG tablet Take 500 mg by mouth daily.   Yes [provider]  famotidine (PEPCID) 20 MG tablet Take 20 mg by mouth at bedtime.   Yes [provider]  gabapentin (NEURONTIN) 600 MG tablet TAKE 1 TABLET BY MOUTH TID 12/20/16  Yes Kathrynn Ducking, MD  HYDROcodone-acetaminophen (NORCO/VICODIN) 5-325 MG per tablet Take 1 tablet by mouth every 6 (six) hours as needed for pain.   Yes [provider]  loratadine (CLARITIN) 10 MG tablet Take 10 mg by mouth daily as needed.    Yes [provider]  Nutritional Supplements (DHEA PO) Take 5 mg by mouth daily.   Yes [provider]  pantoprazole (PROTONIX) 40 MG tablet  Take 1 tablet (40 mg total) by mouth daily. Patient takes every 3 days 07/10/17  Yes Ladene Artist, MD  UNABLE TO FIND Take 1 Dose by mouth daily. Med Name: CBD oil- one dose under tongue daily   Yes [provider]  vitamin E 400 UNIT capsule Take 400 Units by mouth daily.   Yes [provider]  zolpidem (AMBIEN) 5 MG tablet Take 1 tablet (5 mg total) by mouth at bedtime as needed. for sleep 12/21/15  Yes Kathrynn Ducking, MD    ROS:  Out of a complete 14 system review of symptoms, the patient complains only of the following symptoms, and all other reviewed systems are negative.  Environmental allergies Memory problems  Blood pressure 110/70, pulse (!) 102, height 5\' 5"  (1.651 m), weight 144 lb (65.3 kg), SpO2 98 %.  Physical Exam  General: The patient is alert and cooperative at the time of the examination.  Skin: No significant peripheral edema is noted.   Neurologic Exam  Mental status: The patient is alert and oriented x 3 at the time of the examination. The patient has apparent normal recent and remote memory, with an apparently normal attention span and concentration ability.  Mini-Mental status examination done today shows a total score 30/30.   Cranial nerves: Facial symmetry is present. Speech is normal, no aphasia or dysarthria is noted. Extraocular movements are full. Visual fields are full.  Motor: The patient has good strength in all 4 extremities.  Sensory examination: Soft touch sensation is symmetric on the face, arms, and legs.  Coordination: The patient has good finger-nose-finger and heel-to-shin bilaterally.  Gait and station: The patient has a normal gait. Tandem gait is normal. Romberg is negative. No drift is seen.  Reflexes: Deep tendon reflexes are symmetric.   Assessment/Plan:  1.  Reported memory disturbance  2.  Fibromyalgia  Given the complaints of worsening memory, the patient will be sent for MRI of the brain.  She will  continue on the gabapentin and the Ambien, she will follow-up in this office in about 6 months.  Jill Alexanders MD 09/30/2017 12:31 PM  [facility] [address] [place], [place] [address]  Phone [phone] Fax [phone]

## 2017-09-30 NOTE — Patient Instructions (Signed)
We will get MRI of the brain.

## 2017-09-30 NOTE — Telephone Encounter (Signed)
[employer] order sent to GI. They obtain the auth and will reach out to the pt to schedule.  °

## 2017-10-10 ENCOUNTER — Other Ambulatory Visit: Payer: Self-pay

## 2017-10-10 ENCOUNTER — Ambulatory Visit (AMBULATORY_SURGERY_CENTER): Payer: Self-pay

## 2017-10-10 ENCOUNTER — Encounter

## 2017-10-10 VITALS — Ht 65.0 in | Wt 148.0 lb

## 2017-10-10 DIAGNOSIS — Z86010 Personal history of colonic polyps: Secondary | ICD-10-CM

## 2017-10-10 MED ORDER — NA SULFATE-K SULFATE-MG SULF 17.5-3.13-1.6 GM/177ML PO SOLN: 1.0000 | Freq: Once | ORAL | 0 refills | Status: AC

## 2017-10-10 NOTE — Progress Notes (Signed)
Denies allergies to eggs or soy products. Denies complication of anesthesia or sedation. Denies use of weight loss medication. Denies use of O2.   Emmi instructions declined.  

## 2017-10-14 ENCOUNTER — Ambulatory Visit
Admission: RE | Admit: 2017-10-14 | Discharge: 2017-10-14 | Disposition: A | Discharge status: Home / Self Care | Payer: 59 | Source: Ambulatory Visit | Attending: Neurology | Admitting: Neurology

## 2017-10-14 DIAGNOSIS — R413 Other amnesia: Secondary | ICD-10-CM | POA: Diagnosis not present

## 2017-10-15 ENCOUNTER — Telehealth: Payer: Self-pay | Admitting: Neurology

## 2017-10-15 NOTE — Telephone Encounter (Signed)
  I called the patient.  MRI of the brain continues to show mild white matter changes, the patient has had a work-up for demyelinating disease in the past.  She has reported memory issues as far back as 2015.  We will need to follow these issues, this could be part of her fibromyalgia syndrome.  MRI brain 10/15/17:  IMPRESSION:   MRI brain (without) demonstrating: - Mild scattered periventricular, subcortical and juxtacortical foci of gliosis. These findings are non-specific and considerations include autoimmune, inflammatory, post-infectious, microvascular ischemic or migraine associated etiologies.  - No acute findings.

## 2017-10-24 ENCOUNTER — Encounter: Payer: Self-pay | Admitting: Gastroenterology

## 2017-10-24 ENCOUNTER — Other Ambulatory Visit: Payer: Self-pay

## 2017-10-24 ENCOUNTER — Ambulatory Visit (AMBULATORY_SURGERY_CENTER): Payer: 59 | Admitting: Gastroenterology

## 2017-10-24 ENCOUNTER — Encounter

## 2017-10-24 VITALS — BP 118/67 | HR 66 | Temp 98.7°F | Resp 9 | Ht 65.0 in | Wt 148.0 lb

## 2017-10-24 DIAGNOSIS — K317 Polyp of stomach and duodenum: Secondary | ICD-10-CM

## 2017-10-24 DIAGNOSIS — K219 Gastro-esophageal reflux disease without esophagitis: Secondary | ICD-10-CM

## 2017-10-24 DIAGNOSIS — K259 Gastric ulcer, unspecified as acute or chronic, without hemorrhage or perforation: Secondary | ICD-10-CM | POA: Diagnosis not present

## 2017-10-24 DIAGNOSIS — R079 Chest pain, unspecified: Secondary | ICD-10-CM

## 2017-10-24 MED ORDER — SODIUM CHLORIDE 0.9 % IV SOLN: 500.0000 mL | Freq: Once | INTRAVENOUS | Status: DC

## 2017-10-24 NOTE — Patient Instructions (Signed)
  Please read handouts on gastritis and hiatal hernia.     YOU HAD AN ENDOSCOPIC PROCEDURE TODAY AT [facility]:   Refer to the procedure report that was given to you for any specific questions about what was found during the examination.  If the procedure report does not answer your questions, please call your gastroenterologist to clarify.  If you requested that your care partner not be given the details of your procedure findings, then the procedure report has been included in a sealed envelope for you to review at your convenience later.  YOU SHOULD EXPECT: Some feelings of bloating in the abdomen. Passage of more gas than usual.  Walking can help get rid of the air that was put into your GI tract during the procedure and reduce the bloating. If you had a lower endoscopy (such as a colonoscopy or flexible sigmoidoscopy) you may notice spotting of blood in your stool or on the toilet paper. If you underwent a bowel prep for your procedure, you may not have a normal bowel movement for a few days.  Please Note:  You might notice some irritation and congestion in your nose or some drainage.  This is from the oxygen used during your procedure.  There is no need for concern and it should clear up in a day or so.  SYMPTOMS TO REPORT IMMEDIATELY:    Following upper endoscopy (EGD)  Vomiting of blood or coffee ground material  New chest pain or pain under the shoulder blades  Painful or persistently difficult swallowing  New shortness of breath  Fever of 100F or higher  Black, tarry-looking stools  For urgent or emergent issues, a gastroenterologist can be reached at any hour by calling [phone].   DIET:  We do recommend a small meal at first, but then you may proceed to your regular diet.  Drink plenty of fluids but you should avoid alcoholic beverages for 24 hours.  ACTIVITY:  You should plan to take it easy for the rest of today and you should NOT DRIVE or use heavy  machinery until tomorrow (because of the sedation medicines used during the test).    FOLLOW UP: Our staff will call the number listed on your records the next business day following your procedure to check on you and address any questions or concerns that you may have regarding the information given to you following your procedure. If we do not reach you, we will leave a message.  However, if you are feeling well and you are not experiencing any problems, there is no need to return our call.  We will assume that you have returned to your regular daily activities without incident.  If any biopsies were taken you will be contacted by phone or by letter within the next 1-3 weeks.  Please call us at [phone] if you have not heard about the biopsies in 3 weeks.    SIGNATURES/CONFIDENTIALITY: You and/or your care partner have signed paperwork which will be entered into your electronic medical record.  These signatures attest to the fact that that the information above on your After Visit Summary has been reviewed and is understood.  Full responsibility of the confidentiality of this discharge information lies with you and/or your care-partner.

## 2017-10-24 NOTE — Progress Notes (Signed)
Called to room to assist during endoscopic procedure.  Patient ID and intended procedure confirmed with present staff. Received instructions for my participation in the procedure from the performing physician.  

## 2017-10-24 NOTE — Op Note (Signed)
[facility] Patient Name: Amanda Waters Procedure Date: 10/24/2017 9:59 AM MRN: [id number] Endoscopist: Ladene Artist , MD Age: 63 Referring MD:  Date of Birth: 16-Mar-1956 Gender: Female Account #: [id number] Procedure:                Upper GI endoscopy Indications:              Gastroesophageal reflux disease, Unexplained chest                            pain Medicines:                Monitored Anesthesia Care Procedure:                Pre-Anesthesia Assessment:                           - Prior to the procedure, a History and Physical                            was performed, and patient medications and                            allergies were reviewed. The patient's tolerance of                            previous anesthesia was also reviewed. The risks                            and benefits of the procedure and the sedation                            options and risks were discussed with the patient.                            All questions were answered, and informed consent                            was obtained. Prior Anticoagulants: The patient has                            taken no previous anticoagulant or antiplatelet                            agents. ASA Grade Assessment: II - A patient with                            mild systemic disease. After reviewing the risks                            and benefits, the patient was deemed in                            satisfactory condition to undergo the procedure.  After obtaining informed consent, the endoscope was                            passed under direct vision. Throughout the                            procedure, the patient's blood pressure, pulse, and                            oxygen saturations were monitored continuously. The                            Endoscope was introduced through the mouth, and                            advanced to the second part of duodenum. The upper                             GI endoscopy was accomplished without difficulty.                            The patient tolerated the procedure well. Scope In: Scope Out: Findings:                 The examined esophagus was normal.                           A small hiatal hernia was present.                           Multiple 4 to 8 mm pedunculated and sessile polyps                            with no bleeding and no stigmata of recent bleeding                            were found in the gastric fundus and in the gastric                            body. The larger polyps primarily in the gastric                            body were removed with a cold snare. Resection and                            retrieval were complete. Multiple smaller polyps                            not removed.                           A few localized, small non-bleeding erosions were  found in the gastric antrum. There were no stigmata                            of recent bleeding. Biopsies were taken with a cold                            forceps for histology.                           The exam of the stomach was otherwise normal.                           The duodenal bulb and second portion of the                            duodenum were normal. Complications:            No immediate complications. Estimated Blood Loss:     Estimated blood loss was minimal. Impression:               - Normal esophagus.                           - Small hiatal hernia.                           - Multiple gastric polyps. Resected and retrieved.                           - Non-bleeding erosive gastropathy. Biopsied.                           - Normal duodenal bulb and second portion of the                            duodenum. Recommendation:           - Patient has a contact number available for                            emergencies. The signs and symptoms of potential                             delayed complications were discussed with the                            patient. Return to normal activities tomorrow.                            Written discharge instructions were provided to the                            patient.                           - Resume previous diet.                           -  Continue present medications.                           - Await pathology results. Ladene Artist, MD 10/24/2017 10:19:00 AM This report has been signed electronically.

## 2017-10-24 NOTE — Progress Notes (Signed)
Report to PACU, RN, vss, BBS= Clear.  

## 2017-10-27 ENCOUNTER — Telehealth: Payer: Self-pay

## 2017-10-27 NOTE — Telephone Encounter (Signed)
  Follow up Call-  Call back number 10/24/2017  Post procedure Call Back phone  # [phone]  Permission to leave phone message Yes  Some recent data might be hidden     LMOM FOR PT TO CALL BACK IF HAVING ANY DIFFICULTIES.  ADVISED WE WILL TRY TO REACH HER AGAIN THIS AFTERNOON.  Pami Wool/CALL-BACK [facility]

## 2017-11-03 ENCOUNTER — Telehealth: Payer: Self-pay | Admitting: Gastroenterology

## 2017-11-03 NOTE — Telephone Encounter (Signed)
Pt calling regarding EGD results and referral for ENT that Dr. Fuller Plan told her about.

## 2017-11-03 NOTE — Telephone Encounter (Signed)
Left message for patient to call back  

## 2017-11-04 NOTE — Telephone Encounter (Signed)
Patient notified of ENT referral to Dr. Janace Hoard for 11/11/17 3:20

## 2017-11-12 ENCOUNTER — Encounter: Payer: Self-pay | Admitting: Gastroenterology

## 2017-11-13 ENCOUNTER — Other Ambulatory Visit: Payer: Self-pay

## 2017-11-13 ENCOUNTER — Ambulatory Visit (AMBULATORY_SURGERY_CENTER): Payer: 59 | Admitting: Gastroenterology

## 2017-11-13 ENCOUNTER — Encounter

## 2017-11-13 ENCOUNTER — Other Ambulatory Visit: Payer: Self-pay | Admitting: Otolaryngology

## 2017-11-13 ENCOUNTER — Encounter: Payer: Self-pay | Admitting: Gastroenterology

## 2017-11-13 VITALS — BP 99/57 | HR 67 | Temp 98.1°F | Resp 12 | Ht 65.0 in | Wt 144.0 lb

## 2017-11-13 DIAGNOSIS — D123 Benign neoplasm of transverse colon: Secondary | ICD-10-CM | POA: Diagnosis not present

## 2017-11-13 DIAGNOSIS — Z86010 Personal history of colonic polyps: Secondary | ICD-10-CM | POA: Diagnosis not present

## 2017-11-13 DIAGNOSIS — D126 Benign neoplasm of colon, unspecified: Secondary | ICD-10-CM

## 2017-11-13 DIAGNOSIS — D124 Benign neoplasm of descending colon: Secondary | ICD-10-CM

## 2017-11-13 DIAGNOSIS — K635 Polyp of colon: Secondary | ICD-10-CM | POA: Diagnosis not present

## 2017-11-13 DIAGNOSIS — G521 Disorders of glossopharyngeal nerve: Secondary | ICD-10-CM

## 2017-11-13 MED ORDER — SODIUM CHLORIDE 0.9 % IV SOLN: 500.0000 mL | Freq: Once | INTRAVENOUS | Status: DC

## 2017-11-13 NOTE — Op Note (Signed)
[facility] Patient Name: Amanda Waters Procedure Date: 11/13/2017 3:14 PM MRN: [id number] Endoscopist: Ladene Artist , MD Age: 62 Referring MD:  Date of Birth: 12/26/1955 Gender: Female Account #: [id number] Procedure:                Colonoscopy Indications:              Surveillance: Personal history of adenomatous                            polyps on last colonoscopy 5 years ago Medicines:                Monitored Anesthesia Care Procedure:                Pre-Anesthesia Assessment:                           - Prior to the procedure, a History and Physical                            was performed, and patient medications and                            allergies were reviewed. The patient's tolerance of                            previous anesthesia was also reviewed. The risks                            and benefits of the procedure and the sedation                            options and risks were discussed with the patient.                            All questions were answered, and informed consent                            was obtained. Prior Anticoagulants: The patient has                            taken no previous anticoagulant or antiplatelet                            agents. ASA Grade Assessment: II - A patient with                            mild systemic disease. After reviewing the risks                            and benefits, the patient was deemed in                            satisfactory condition to undergo the procedure.  After obtaining informed consent, the colonoscope                            was passed under direct vision. Throughout the                            procedure, the patient's blood pressure, pulse, and                            oxygen saturations were monitored continuously. The                            Colonoscope was introduced through the anus and                            advanced to the the  cecum, identified by                            appendiceal orifice and ileocecal valve. The                            ileocecal valve, appendiceal orifice, and rectum                            were photographed. The quality of the bowel                            preparation was good. The colonoscopy was performed                            without difficulty. The patient tolerated the                            procedure well. Scope In: 3:21:47 PM Scope Out: 3:40:49 PM Scope Withdrawal Time: 0 hours 13 minutes 48 seconds  Total Procedure Duration: 0 hours 19 minutes 2 seconds  Findings:                 The perianal and digital rectal examinations were                            normal.                           Three sessile polyps were found in the descending                            colon, transverse colon and hepatic flexure. The                            polyps were 6 to 7 mm in size. These polyps were                            removed with a cold snare. Resection and retrieval  were complete.                           Multiple medium-mouthed diverticula were found in                            the left colon. There was no evidence of                            diverticular bleeding.                           The exam was otherwise without abnormality on                            direct and retroflexion views. Complications:            No immediate complications. Estimated blood loss:                            None. Estimated Blood Loss:     Estimated blood loss: none. Impression:               - Three 6 to 7 mm polyps in the descending colon,                            in the transverse colon and at the hepatic flexure,                            removed with a cold snare. Resected and retrieved.                           - Mild diverticulosis in the left colon.                           - The examination was otherwise normal on direct                             and retroflexion views. Recommendation:           - Repeat colonoscopy in 5 years for surveillance.                           - Patient has a contact number available for                            emergencies. The signs and symptoms of potential                            delayed complications were discussed with the                            patient. Return to normal activities tomorrow.                            Written discharge instructions were provided to  the                            patient.                           - High fiber diet.                           - Continue present medications.                           - Await pathology results. Ladene Artist, MD 11/13/2017 3:50:25 PM This report has been signed electronically.

## 2017-11-13 NOTE — Progress Notes (Signed)
Called to room to assist during endoscopic procedure.  Patient ID and intended procedure confirmed with present staff. Received instructions for my participation in the procedure from the performing physician.  

## 2017-11-13 NOTE — Progress Notes (Signed)
Report given to PACU, vss 

## 2017-11-13 NOTE — Patient Instructions (Signed)
*  HANDOUTS ON POLYPS, DIVERTICULOSIS, AND HIGH FIBER DIET GIVEN OUT*   YOU HAD AN ENDOSCOPIC PROCEDURE TODAY AT [facility]:   Refer to the procedure report that was given to you for any specific questions about what was found during the examination.  If the procedure report does not answer your questions, please call your gastroenterologist to clarify.  If you requested that your care partner not be given the details of your procedure findings, then the procedure report has been included in a sealed envelope for you to review at your convenience later.  YOU SHOULD EXPECT: Some feelings of bloating in the abdomen. Passage of more gas than usual.  Walking can help get rid of the air that was put into your GI tract during the procedure and reduce the bloating. If you had a lower endoscopy (such as a colonoscopy or flexible sigmoidoscopy) you may notice spotting of blood in your stool or on the toilet paper. If you underwent a bowel prep for your procedure, you may not have a normal bowel movement for a few days.  Please Note:  You might notice some irritation and congestion in your nose or some drainage.  This is from the oxygen used during your procedure.  There is no need for concern and it should clear up in a day or so.  SYMPTOMS TO REPORT IMMEDIATELY:   Following lower endoscopy (colonoscopy or flexible sigmoidoscopy):  Excessive amounts of blood in the stool  Significant tenderness or worsening of abdominal pains  Swelling of the abdomen that is new, acute  Fever of 100F or higher   For urgent or emergent issues, a gastroenterologist can be reached at any hour by calling [phone].   DIET:  We do recommend a small meal at first, but then you may proceed to your regular diet.  Drink plenty of fluids but you should avoid alcoholic beverages for 24 hours.  ACTIVITY:  You should plan to take it easy for the rest of today and you should NOT DRIVE or use heavy machinery  until tomorrow (because of the sedation medicines used during the test).    FOLLOW UP: Our staff will call the number listed on your records the next business day following your procedure to check on you and address any questions or concerns that you may have regarding the information given to you following your procedure. If we do not reach you, we will leave a message.  However, if you are feeling well and you are not experiencing any problems, there is no need to return our call.  We will assume that you have returned to your regular daily activities without incident.  If any biopsies were taken you will be contacted by phone or by letter within the next 1-3 weeks.  Please call us at [phone] if you have not heard about the biopsies in 3 weeks.    SIGNATURES/CONFIDENTIALITY: You and/or your care partner have signed paperwork which will be entered into your electronic medical record.  These signatures attest to the fact that that the information above on your After Visit Summary has been reviewed and is understood.  Full responsibility of the confidentiality of this discharge information lies with you and/or your care-partner.

## 2017-11-14 ENCOUNTER — Telehealth: Payer: Self-pay | Admitting: *Deleted

## 2017-11-14 NOTE — Telephone Encounter (Signed)
  Follow up Call-  Call back number 11/13/2017 10/24/2017  Post procedure Call Back phone  # [phone]  Permission to leave phone message Yes Yes  Some recent data might be hidden     Patient questions:  Do you have a fever, pain , or abdominal swelling? No. Pain Score  0 *  Have you tolerated food without any problems? Yes.    Have you been able to return to your normal activities? Yes.    Do you have any questions about your discharge instructions: Diet   No. Medications  No. Follow up visit  No.  Do you have questions or concerns about your Care? No.  Actions: * If pain score is 4 or above: No action needed, pain <4.

## 2017-11-23 ENCOUNTER — Encounter: Payer: Self-pay | Admitting: Gastroenterology

## 2017-12-29 ENCOUNTER — Ambulatory Visit: Payer: 59 | Admitting: Neurology

## 2018-01-05 ENCOUNTER — Other Ambulatory Visit: Payer: Self-pay | Admitting: Neurology

## 2018-04-02 ENCOUNTER — Encounter: Payer: Self-pay | Admitting: Neurology

## 2018-04-02 ENCOUNTER — Telehealth: Payer: Self-pay | Admitting: Neurology

## 2018-04-02 ENCOUNTER — Ambulatory Visit (INDEPENDENT_AMBULATORY_CARE_PROVIDER_SITE_OTHER): Payer: 59 | Admitting: Neurology

## 2018-04-02 ENCOUNTER — Other Ambulatory Visit: Payer: Self-pay

## 2018-04-02 VITALS — BP 120/70 | HR 72 | Resp 16 | Ht 65.0 in | Wt 147.0 lb

## 2018-04-02 DIAGNOSIS — R413 Other amnesia: Secondary | ICD-10-CM | POA: Diagnosis not present

## 2018-04-02 DIAGNOSIS — M797 Fibromyalgia: Secondary | ICD-10-CM

## 2018-04-02 DIAGNOSIS — R9089 Other abnormal findings on diagnostic imaging of central nervous system: Secondary | ICD-10-CM | POA: Diagnosis not present

## 2018-04-02 MED ORDER — ZOLPIDEM TARTRATE 5 MG PO TABS: 5.0000 mg | ORAL_TABLET | Freq: Every evening | ORAL | 3 refills | Status: DC | PRN

## 2018-04-02 MED ORDER — BUPROPION HCL ER (SR) 150 MG PO TB12: 150.0000 mg | ORAL_TABLET | Freq: Every day | ORAL | 3 refills | Status: DC

## 2018-04-02 NOTE — Progress Notes (Signed)
Reason for visit: Fibromyalgia  Amanda Waters is an 62 y.o. female  History of present illness:  Amanda Waters is a 62 year old right-handed white female with a history of fibromyalgia.  The patient has had some issues with memory problems, she has been relatively stable in this regard.  She had a recent MRI of the brain that shows chronic white matter changes, she has been worked up for multiple sclerosis in the past but this diagnosis was never confirmed.  The patient has had increasing problems with depression over the last month, she is feeling more withdrawn, she is having problems functioning day-to-day because of this.  The patient recently fell out of bed, she hurt her right shoulder, she will be seeing an orthopedic surgeon in the near future.  The patient returns to the office today for an evaluation.  Past Medical History:  Diagnosis Date  . Abnormal finding on MRI of brain 12/19/2014  . Adenomatous polyp of colon 2006  . Allergy   . Chronic back pain   . Dyslipidemia   . Fibromyalgia   . GERD (gastroesophageal reflux disease)   . History of carpal tunnel syndrome   . History of hemorrhoids   . HSV-1 (herpes simplex virus 1) infection   . HSV-2 (herpes simplex virus 2) infection   . Hyperlipidemia   . IBS (irritable bowel syndrome)   . 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 Iron deficiency anemia   . Kidney infection   . Kidney stones   . Left radial fracture    Radial head fracture  . Memory difficulty 12/16/2013  . Occipital neuralgia   . Osteopenia   . Sleep apnea   . Swollen lymph nodes     Past Surgical History:  Procedure Laterality Date  . Broken Elbow Bilateral   . CHOLECYSTECTOMY    . COLONOSCOPY    . CYSTECTOMY    . DILATION AND CURETTAGE OF UTERUS    . hammartoe surgery Left    2nd toe  . POLYPECTOMY    . SHOULDER SURGERY    . TONSILLECTOMY      Family History  Problem Relation Age of Onset  .  Fibromyalgia Mother   . Colon polyps Mother   . Diabetes Father   . Thyroid cancer Father   . Heart disease Father   . Dementia Father   . Colon polyps Father   . Colon cancer Neg Hx   . Esophageal cancer Neg Hx   . Liver cancer Neg Hx   . Pancreatic cancer Neg Hx   . Stomach cancer Neg Hx   . Rectal cancer Neg Hx     Social history:  reports that she has never smoked. She has never used smokeless tobacco. She reports that she drinks alcohol. She reports that she does not use drugs.    Allergies  Allergen Reactions  . Celebrex [Celecoxib]   . Darvon [Propoxyphene]   . Duract [Bromfenac]   . Nitrofuran Derivatives     Headache   . Oxycodone-Acetaminophen   . Prednisone   . Sulfa Antibiotics     Medications:  Prior to Admission medications   Medication Sig Start Date End Date Taking? Authorizing Provider  b complex vitamins tablet Take 1 tablet by mouth daily.   Yes [provider]  Calcium Carbonate-Vitamin D (CALTRATE 600+D) 600-400 MG-UNIT per chew tablet Chew 1 tablet by mouth daily.   Yes [provider]  cyclobenzaprine (FLEXERIL)  10 MG tablet Take 10 mg by mouth 3 (three) times daily as needed.   Yes [provider]  diclofenac (FLECTOR) 1.3 % PTCH Place 1 patch onto the skin daily as needed.   Yes [provider]  famciclovir (FAMVIR) 500 MG tablet Take 500 mg by mouth daily.   Yes [provider]  famotidine (PEPCID) 20 MG tablet Take 20 mg by mouth at bedtime.   Yes [provider]  gabapentin (NEURONTIN) 600 MG tablet TAKE 1 TABLET BY MOUTH 3 TIMES DAILY 01/06/18  Yes Kathrynn Ducking, MD  HYDROcodone-acetaminophen (NORCO/VICODIN) 5-325 MG per tablet Take 1 tablet by mouth every 6 (six) hours as needed for pain.   Yes [provider]  loratadine (CLARITIN) 10 MG tablet Take 10 mg by mouth daily as needed.    Yes [provider]  Nutritional Supplements (DHEA PO) Take 5 mg by mouth daily.   Yes  [provider]  pantoprazole (PROTONIX) 40 MG tablet Take 1 tablet (40 mg total) by mouth daily. Patient takes every 3 days 07/10/17  Yes Ladene Artist, MD  vitamin E 400 UNIT capsule Take 400 Units by mouth daily.   Yes [provider]  zolpidem (AMBIEN) 5 MG tablet Take 1 tablet (5 mg total) by mouth at bedtime as needed. for sleep 12/21/15  Yes Kathrynn Ducking, MD    ROS:  Out of a complete 14 system review of symptoms, the patient complains only of the following symptoms, and all other reviewed systems are negative.  Mild memory disturbance Right shoulder pain  Blood pressure 120/70, pulse 72, resp. rate 16, height 5\' 5"  (1.651 m), weight 147 lb (66.7 kg).  Physical Exam  General: The patient is alert and cooperative at the time of the examination.  Skin: No significant peripheral edema is noted.   Neurologic Exam  Mental status: The patient is alert and oriented x 3 at the time of the examination. The patient has apparent normal recent and remote memory, with an apparently normal attention span and concentration ability.  Mini-Mental status examination done today shows a total score of 29/30.  The patient is able to name 16 animals in 1 minute.   Cranial nerves: Facial symmetry is present. Speech is normal, no aphasia or dysarthria is noted. Extraocular movements are full. Visual fields are full.  Motor: The patient has good strength in all 4 extremities.  The right arm is in a sling.  Sensory examination: Soft touch sensation is symmetric on the face, arms, and legs.  Coordination: The patient has good finger-nose-finger and heel-to-shin bilaterally.  Gait and station: The patient has a normal gait. Tandem gait is normal. Romberg is negative. No drift is seen.  Reflexes: Deep tendon reflexes are symmetric.   MRI brain 10/15/17:  IMPRESSION:   MRI brain (without) demonstrating: - Mild scattered periventricular, subcortical and juxtacortical foci  of gliosis. These findings are non-specific and considerations include autoimmune, inflammatory, post-infectious, microvascular ischemic or migraine associated etiologies.  - No acute findings.  * MRI scan images were reviewed online. I agree with the written report.    Assessment/Plan:  1.  Fibromyalgia  2.  Depression  3.  Abnormal MRI brain  4.  Mild memory disturbance  We will follow the memory issues over time.  The patient appears to be doing fairly well in this regard.  The patient has recently hurt her right shoulder, she will be going to orthopedic surgery.  The patient is having  increased problems with depression, we will start Wellbutrin taking 150 mg daily.  She will call for any dose adjustments.  She will try to see a psychologist or counselor in the near future.  She will follow-up here in about 6 months.  We will try to get the old MRI brain disc to compare to the new study.  Addendum: I have compared the disc of the MRI of the brain done on 12 March 2012 with the current scan, the white matter lesions appear to be relatively similar, there has not been much change in the degree of white matter lesions seen.  Jill Alexanders MD 04/02/2018 3:54 PM  [facility] [address] [place],  [address]  Phone [phone] Fax [phone]

## 2018-04-02 NOTE — Telephone Encounter (Signed)
Spoke with Amanda Waters and explained that [facility] sent 1 yr. refills of [facility] at [facility] Aug[facility] appt.  She should have r/f available. She verbalized understanding of same.  [facility]W last rx'd Ambien in 2017.  I asked pt. how she is taking this. She sts. she takes it PRN, and that her OB/GYN rx'd this for her last yr.  Will check with CKW to see if he will continue rx/fim

## 2018-04-02 NOTE — Telephone Encounter (Signed)
The patient should have plenty of gabapentin, I will send in a prescription for the Ambien.

## 2018-04-02 NOTE — Telephone Encounter (Signed)
Pt requesting refills for gabapentin (NEURONTIN) 600 MG tablet and zolpidem (AMBIEN) 5 MG tablet sent to [facility]

## 2018-07-01 ENCOUNTER — Other Ambulatory Visit: Payer: Self-pay | Admitting: Gastroenterology

## 2018-10-02 ENCOUNTER — Telehealth: Payer: Self-pay | Admitting: Gastroenterology

## 2018-10-02 NOTE — Telephone Encounter (Signed)
Informed patient to contact her insurance company and find out what the preferred PPI is for the year and then we can send that particular one to the pharmacy in place of pantoprazole. Patient states she will contact them next week and call me back to let me know.

## 2018-10-19 ENCOUNTER — Telehealth: Payer: Self-pay | Admitting: Neurology

## 2018-10-19 NOTE — Telephone Encounter (Signed)
Due to current COVID 19 pandemic, our office is severely reducing in office visits until further notice, in order to minimize the risk to our patients and healthcare providers.   I called patient today and offered a virtual visit for her 6/2 appt. She declined a virtual visit and requested an in office visit. She understands the precautions we are taking and is aware that she will need to bring a mask if she has one and will have her temp checked and be asked screening questions upon arrival. She states that she would really like to discuss her MRI that she thinks she had back in 2012 and the MRI she had last year and compare the 2. The MRI from last year is visible in her chart but I did not see record of the 2012 MRI. I advised her to check with GI today to see if they may have record of it and it can be discussed at her appt tomorrow. She verbalized understanding.

## 2018-10-20 ENCOUNTER — Encounter: Payer: Self-pay | Admitting: Neurology

## 2018-10-20 ENCOUNTER — Other Ambulatory Visit: Payer: Self-pay

## 2018-10-20 ENCOUNTER — Ambulatory Visit (INDEPENDENT_AMBULATORY_CARE_PROVIDER_SITE_OTHER): Payer: 59 | Admitting: Neurology

## 2018-10-20 VITALS — BP 144/88 | HR 90 | Temp 97.8°F | Ht 65.0 in | Wt 147.0 lb

## 2018-10-20 DIAGNOSIS — R413 Other amnesia: Secondary | ICD-10-CM

## 2018-10-20 DIAGNOSIS — M797 Fibromyalgia: Secondary | ICD-10-CM

## 2018-10-20 DIAGNOSIS — R9089 Other abnormal findings on diagnostic imaging of central nervous system: Secondary | ICD-10-CM | POA: Diagnosis not present

## 2018-10-20 MED ORDER — ZOLPIDEM TARTRATE 5 MG PO TABS: 5.0000 mg | ORAL_TABLET | Freq: Every evening | ORAL | 3 refills | Status: DC | PRN

## 2018-10-20 MED ORDER — GABAPENTIN 600 MG PO TABS: ORAL_TABLET | ORAL | 3 refills | Status: DC

## 2018-10-20 NOTE — Progress Notes (Signed)
Reason for visit: Fibromyalgia, memory problem  Amanda Waters is an 63 y.o. female  History of present illness:  Amanda Waters is a 63 year old right-handed white female with a history of fibromyalgia.  The patient is careful not to overdo as she will have to rest for a day or 2 in order to get over higher levels of activity.  The patient still has some troubles with short-term memory and difficulty with spelling.  She is still working, she is able to manage her job well.  The patient operates a motor vehicle without difficulty.  She was given a prescription for Wellbutrin on her last visit, she never took the medication but she did see a counselor which seemed to help.  The patient has noted recently that her blood pressure is elevating.  She generally sleeps well at night, she denies any new issues such as numbness or weakness or balance changes.  MRI of the brain done recently shows white matter changes in the brain, this was unchanged from prior study done in 2013.  Past Medical History:  Diagnosis Date  . Abnormal finding on MRI of brain 12/19/2014  . Adenomatous polyp of colon 2006  . Allergy   . Chronic back pain   . Dyslipidemia   . Fibromyalgia   . GERD (gastroesophageal reflux disease)   . History of carpal tunnel syndrome   . History of hemorrhoids   . HSV-1 (herpes simplex virus 1) infection   . HSV-2 (herpes simplex virus 2) infection   . Hyperlipidemia   . IBS (irritable bowel syndrome)   . 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 Iron deficiency anemia   . Kidney infection   . Kidney stones   . Left radial fracture    Radial head fracture  . Memory difficulty 12/16/2013  . Occipital neuralgia   . Osteopenia   . Sleep apnea   . Swollen lymph nodes     Past Surgical History:  Procedure Laterality Date  . Broken Elbow Bilateral   . CHOLECYSTECTOMY    . COLONOSCOPY    . CYSTECTOMY    . DILATION AND CURETTAGE OF UTERUS    .  hammartoe surgery Left    2nd toe  . POLYPECTOMY    . SHOULDER SURGERY    . TONSILLECTOMY      Family History  Problem Relation Age of Onset  . Fibromyalgia Mother   . Colon polyps Mother   . Diabetes Father   . Thyroid cancer Father   . Heart disease Father   . Dementia Father   . Colon polyps Father   . Colon cancer Neg Hx   . Esophageal cancer Neg Hx   . Liver cancer Neg Hx   . Pancreatic cancer Neg Hx   . Stomach cancer Neg Hx   . Rectal cancer Neg Hx     Social history:  reports that she has never smoked. She has never used smokeless tobacco. She reports current alcohol use. She reports that she does not use drugs.    Allergies  Allergen Reactions  . Celebrex [Celecoxib]   . Darvon [Propoxyphene]   . Duract [Bromfenac]   . Nitrofuran Derivatives     Headache   . Oxycodone-Acetaminophen   . Prednisone   . Sulfa Antibiotics     Medications:  Prior to Admission medications   Medication Sig Start Date End Date Taking? Authorizing Provider  b complex vitamins tablet Take 1 tablet  by mouth daily.   Yes [provider]  buPROPion (WELLBUTRIN SR) 150 MG 12 hr tablet Take 1 tablet (150 mg total) by mouth daily. 04/02/18  Yes Kathrynn Ducking, MD  Calcium Carbonate-Vitamin D (CALTRATE 600+D) 600-400 MG-UNIT per chew tablet Chew 1 tablet by mouth daily.   Yes [provider]  cyclobenzaprine (FLEXERIL) 10 MG tablet Take 10 mg by mouth 3 (three) times daily as needed.   Yes [provider]  diclofenac (FLECTOR) 1.3 % PTCH Place 1 patch onto the skin daily as needed.   Yes [provider]  famciclovir (FAMVIR) 500 MG tablet Take 500 mg by mouth daily.   Yes [provider]  famotidine (PEPCID) 20 MG tablet Take 20 mg by mouth at bedtime.   Yes [provider]  gabapentin (NEURONTIN) 600 MG tablet TAKE 1 TABLET BY MOUTH 3 TIMES DAILY 01/06/18  Yes Kathrynn Ducking, MD  HYDROcodone-acetaminophen (NORCO/VICODIN) 5-325 MG  per tablet Take 1 tablet by mouth every 6 (six) hours as needed for pain.   Yes [provider]  loratadine (CLARITIN) 10 MG tablet Take 10 mg by mouth daily as needed.    Yes [provider]  Nutritional Supplements (DHEA PO) Take 5 mg by mouth daily.   Yes [provider]  pantoprazole (PROTONIX) 40 MG tablet Take one tablet by mouth daily 07/01/18  Yes Ladene Artist, MD  vitamin E 400 UNIT capsule Take 400 Units by mouth daily.   Yes [provider]  zolpidem (AMBIEN) 5 MG tablet Take 1 tablet (5 mg total) by mouth at bedtime as needed. for sleep 04/02/18  Yes Kathrynn Ducking, MD    ROS:  Out of a complete 14 system review of symptoms, the patient complains only of the following symptoms, and all other reviewed systems are negative.  Memory problems Fatigue  Blood pressure (!) 144/88, pulse 90, temperature 97.8 F (36.6 C), temperature source Oral, height 5\' 5"  (1.651 m), weight 147 lb (66.7 kg).  Physical Exam  General: The patient is alert and cooperative at the time of the examination.  Skin: No significant peripheral edema is noted.   Neurologic Exam  Mental status: The patient is alert and oriented x 3 at the time of the examination. The patient has apparent normal recent and remote memory, with an apparently normal attention span and concentration ability.  The Mini-Mental status examination done today shows a total score of 29/30.   Cranial nerves: Facial symmetry is present. Speech is normal, no aphasia or dysarthria is noted. Extraocular movements are full. Visual fields are full.  Motor: The patient has good strength in all 4 extremities.  Sensory examination: Soft touch sensation is symmetric on the face, arms, and legs.  Coordination: The patient has good finger-nose-finger and heel-to-shin bilaterally.  Gait and station: The patient has a normal gait. Tandem gait is normal. Romberg is negative. No drift is seen.   Reflexes: Deep tendon reflexes are symmetric.   Assessment/Plan:  1.  Mild memory disturbance  2.  Fibromyalgia  3.  Abnormal MRI brain  The patient is in a study looking at memory problems, treated with lifestyle changes and diet.  The patient has noted a mild elevation of her blood pressure recently, this may need to be treated in the future.  She will follow-up here in about 6 months, sooner if needed.  A prescription was given for her Ambien and for the gabapentin.  Jill Alexanders MD 10/20/2018  4:16 PM  [facility] [address] [place], [place] [address]  Phone [phone] Fax [phone]

## 2018-10-25 ENCOUNTER — Other Ambulatory Visit: Payer: Self-pay | Admitting: Gastroenterology

## 2019-02-15 ENCOUNTER — Telehealth: Payer: Self-pay

## 2019-02-15 NOTE — Telephone Encounter (Signed)
Lvm asking pt to call back so we could reschedule her 04/26/2019 appt due to Dr. Jannifer Franklin schedule changing.  Please reschedule with Dr. Jannifer Franklin.

## 2019-03-15 ENCOUNTER — Telehealth: Payer: Self-pay | Admitting: Gastroenterology

## 2019-03-15 MED ORDER — PANTOPRAZOLE SODIUM 40 MG PO TBEC: DELAYED_RELEASE_TABLET | ORAL | 0 refills | Status: DC

## 2019-03-15 NOTE — Telephone Encounter (Signed)
Prescription sent to patient's pharmacy until scheduled appt. 

## 2019-04-12 ENCOUNTER — Other Ambulatory Visit: Payer: Self-pay | Admitting: Gastroenterology

## 2019-04-13 ENCOUNTER — Telehealth: Payer: Self-pay | Admitting: Gastroenterology

## 2019-04-13 MED ORDER — PANTOPRAZOLE SODIUM 40 MG PO TBEC: DELAYED_RELEASE_TABLET | ORAL | 0 refills | Status: DC

## 2019-04-13 NOTE — Telephone Encounter (Signed)
Pt just made an appt to see Nevin Bloodgood on 12/16, she is requesting rf [facility]. She also wanted to inform that she was dx with Covid on Nov 1st, she has been with no fever since the past 4 days. I told her that she should be fine to come to her appt on 12/16. If this information was incorrect pls advise to r/s pt.

## 2019-04-13 NOTE — Telephone Encounter (Signed)
Informed patient I sent the prescription of pantoprazole to her pharmacy. Patient verbalized understanding.

## 2019-04-26 ENCOUNTER — Ambulatory Visit: Payer: 59 | Admitting: Neurology

## 2019-05-04 ENCOUNTER — Telehealth: Payer: Self-pay | Admitting: Gastroenterology

## 2019-05-04 MED ORDER — PANTOPRAZOLE SODIUM 40 MG PO TBEC: DELAYED_RELEASE_TABLET | ORAL | 0 refills | Status: DC

## 2019-05-04 NOTE — Telephone Encounter (Signed)
Prescription sent to patient's pharmacy until scheduled appt. 

## 2019-05-04 NOTE — Telephone Encounter (Signed)
Pt just r/s her appt for tomorrow due to the weather. She has been r/s to 1/4 with Anderson Malta. She needs rf of pantoprazole before that. Pls send it to her pharmacy.

## 2019-05-05 ENCOUNTER — Ambulatory Visit: Payer: 59 | Admitting: Gastroenterology

## 2019-05-05 ENCOUNTER — Ambulatory Visit: Payer: 59 | Admitting: Nurse Practitioner

## 2019-05-24 ENCOUNTER — Ambulatory Visit (INDEPENDENT_AMBULATORY_CARE_PROVIDER_SITE_OTHER): Payer: PRIVATE HEALTH INSURANCE | Admitting: Physician Assistant

## 2019-05-24 ENCOUNTER — Encounter: Payer: Self-pay | Admitting: Physician Assistant

## 2019-05-24 ENCOUNTER — Other Ambulatory Visit (INDEPENDENT_AMBULATORY_CARE_PROVIDER_SITE_OTHER): Payer: PRIVATE HEALTH INSURANCE

## 2019-05-24 VITALS — BP 132/70 | HR 77 | Temp 97.9°F | Ht 65.0 in | Wt 144.0 lb

## 2019-05-24 DIAGNOSIS — R194 Change in bowel habit: Secondary | ICD-10-CM

## 2019-05-24 DIAGNOSIS — R5383 Other fatigue: Secondary | ICD-10-CM | POA: Diagnosis not present

## 2019-05-24 DIAGNOSIS — K219 Gastro-esophageal reflux disease without esophagitis: Secondary | ICD-10-CM

## 2019-05-24 LAB — CBC WITH DIFFERENTIAL/PLATELET
Basophils Absolute: 0 10*3/uL (ref 0.0–0.1)
Basophils Relative: 0.5 % (ref 0.0–3.0)
Eosinophils Absolute: 0.1 10*3/uL (ref 0.0–0.7)
Eosinophils Relative: 1 % (ref 0.0–5.0)
HCT: 37.9 % (ref 36.0–46.0)
Hemoglobin: 12.7 g/dL (ref 12.0–15.0)
Lymphocytes Relative: 31.4 % (ref 12.0–46.0)
Lymphs Abs: 2.1 10*3/uL (ref 0.7–4.0)
MCHC: 33.4 g/dL (ref 30.0–36.0)
MCV: 80.8 fl (ref 78.0–100.0)
Monocytes Absolute: 0.7 10*3/uL (ref 0.1–1.0)
Monocytes Relative: 10 % (ref 3.0–12.0)
Neutro Abs: 3.9 10*3/uL (ref 1.4–7.7)
Neutrophils Relative %: 57.1 % (ref 43.0–77.0)
Platelets: 311 10*3/uL (ref 150.0–400.0)
RBC: 4.69 Mil/uL (ref 3.87–5.11)
RDW: 15.1 % (ref 11.5–15.5)
WBC: 6.8 10*3/uL (ref 4.0–10.5)

## 2019-05-24 LAB — COMPREHENSIVE METABOLIC PANEL
ALT: 26 U/L (ref 0–35)
AST: 21 U/L (ref 0–37)
Albumin: 4.6 g/dL (ref 3.5–5.2)
Alkaline Phosphatase: 47 U/L (ref 39–117)
BUN: 13 mg/dL (ref 6–23)
CO2: 27 mEq/L (ref 19–32)
Calcium: 9.5 mg/dL (ref 8.4–10.5)
Chloride: 105 mEq/L (ref 96–112)
Creatinine, Ser: 0.92 mg/dL (ref 0.40–1.20)
GFR: 62.02 mL/min (ref >60.00)
Glucose, Bld: 102 mg/dL — ABNORMAL HIGH (ref 70–99)
Potassium: 4.3 mEq/L (ref 3.5–5.1)
Sodium: 140 mEq/L (ref 135–145)
Total Bilirubin: 0.8 mg/dL (ref 0.2–1.2)
Total Protein: 7.1 g/dL (ref 6.0–8.3)

## 2019-05-24 MED ORDER — PANTOPRAZOLE SODIUM 40 MG PO TBEC: DELAYED_RELEASE_TABLET | ORAL | 3 refills | Status: DC

## 2019-05-24 NOTE — Progress Notes (Signed)
Chief Complaint: Follow-up reflux  HPI:    Amanda Waters is a 64 year old Caucasian female with a past medical history as listed below including IBS and reflux, known to Dr. Fuller Plan, who was referred to me by Maylon Peppers, MD for follow-up of reflux.      07/10/2017 office visit Dr. Fuller Plan.  At that time described midsternal chest pain occurring approximately once per week related to reflux.  That visit she was told that due to her history of adenomatous polyps her colonoscopy would be due in June.  She was resumed on pantoprazole 40 mg daily for 2 months.  EGD recommended at time of colonoscopy.    10/24/2017 EGD with normal esophagus, small hiatal hernia, multiple gastric polyps, nonbleeding erosive gastropathy and normal duodenal bulb and second portion of duodenum.    11/13/2017 colonoscopy with 3 6-7 mm polyps in the descending, transverse colon and hepatic flexure.  Mild diverticulosis in the left colon otherwise normal.  Pathology revealed sessile serrated polyps repeat recommended in 3 years.      Today, the patient presents clinic and explains that she was diagnosed with Covid on November 1.  Explains that at first she was having severe diarrhea which was black in color, she now continues with diarrhea which has become more intermittent, she will have some days with a formed stool and then go back to diarrhea the next day but it is no longer black.  This also irritated her reflux symptoms, which were well under control on Pantoprazole 40 mg daily until she ran out about 3 days ago and she has been having breakthrough symptoms over the past few days.  She is requesting a refill of her medicine.  Continues on Pepcid 40 mg nightly.  Associated symptoms include fatigue and some palpitations if she starts to move around.  Patient tells me that she is due to follow-up with her PCP at some time soon.    Denies fever, chills or abdominal pain.  Previous GI work-up:    * CT Abdomen/Pelvis 12/16/14 found: "1.  No evidence of acute appendicitis. 2. Small scattered mesenteric lymph nodes are nonspecific but may be consistent with enteritis. No evidence of bowel obstruction. 3. Benign-appearing parapelvic cysts of the left kidney." * EGD/colonoscopy with Dr. Valli Glance June 2014. EGD found gastric polyps (fundic gland polyps on pathology). Colonoscopy found mild diverticulosis and internal hemorrhoids. He had recommended a repeat in 3 years due to her previous history of polyps and her family history * Colonoscopy August 2011 with Dr. Valli Glance found internal hemorrhoids and marginal prep * EGD October 2008 with Dr. Gilford Rile found a 2 cm hiatal hernia and moderate gastritis. Esophagus biopsies were negative for EoE. Stomach biopsies were negative for H. Pylori. * EGD/colonoscopy May 2006 with Dr. Gilford Rile. EGD found mild esophagitis, a 1 cm hiatal hernia, and mild gastritis. Colonoscopy found a ~3-4 mm adenomatous polyp at 15 cm and internal hemorrhoids  Past Medical History:  Diagnosis Date  . Abnormal finding on MRI of brain 12/19/2014  . Adenomatous polyp of colon 2006  . Allergy   . Chronic back pain   . Dyslipidemia   . Fibromyalgia   . GERD (gastroesophageal reflux disease)   . History of carpal tunnel syndrome   . History of hemorrhoids   . HSV-1 (herpes simplex virus 1) infection   . HSV-2 (herpes simplex virus 2) infection   . Hyperlipidemia   . IBS (irritable bowel syndrome)   . Infection due to non-O157 Shiga  toxin-producing Escherichia coli (E.coli)   . Iron deficiency anemia   . Kidney infection   . Kidney stones   . Left radial fracture    Radial head fracture  . Memory difficulty 12/16/2013  . Occipital neuralgia   . Osteopenia   . Sleep apnea   . Swollen lymph nodes     Past Surgical History:  Procedure Laterality Date  . Broken Elbow Bilateral   . CHOLECYSTECTOMY    . COLONOSCOPY    . CYSTECTOMY    . DILATION AND CURETTAGE OF UTERUS    . hammartoe surgery Left    2nd toe  .  POLYPECTOMY    . SHOULDER SURGERY    . TONSILLECTOMY      Current Outpatient Medications  Medication Sig Dispense Refill  . b complex vitamins tablet Take 1 tablet by mouth daily.    . Calcium Carbonate-Vitamin D (CALTRATE 600+D) 600-400 MG-UNIT per chew tablet Chew 1 tablet by mouth daily.    . cyclobenzaprine (FLEXERIL) 10 MG tablet Take 10 mg by mouth 3 (three) times daily as needed.    . diclofenac (FLECTOR) 1.3 % PTCH Place 1 patch onto the skin daily as needed.    . famciclovir (FAMVIR) 500 MG tablet Take 500 mg by mouth daily.    . famotidine (PEPCID) 20 MG tablet Take 20 mg by mouth at bedtime.    . gabapentin (NEURONTIN) 600 MG tablet TAKE 1 TABLET BY MOUTH 3 TIMES DAILY 270 tablet 3  . HYDROcodone-acetaminophen (NORCO/VICODIN) 5-325 MG per tablet Take 1 tablet by mouth every 6 (six) hours as needed for pain.    Marland Kitchen loratadine (CLARITIN) 10 MG tablet Take 10 mg by mouth daily as needed.     . Nutritional Supplements (DHEA PO) Take 5 mg by mouth daily.    . pantoprazole (PROTONIX) 40 MG tablet TAKE 1 TABLET BY MOUTH EVERY DAY 30 tablet 0  . vitamin E 400 UNIT capsule Take 400 Units by mouth daily.    Marland Kitchen zolpidem (AMBIEN) 5 MG tablet Take 1 tablet (5 mg total) by mouth at bedtime as needed. for sleep 30 tablet 3   Current Facility-Administered Medications  Medication Dose Route Frequency Provider Last Rate Last Admin  . 0.9 %  sodium chloride infusion  500 mL Intravenous Once Lucio Edward T, MD      . 0.9 %  sodium chloride infusion  500 mL Intravenous Once Ladene Artist, MD        Allergies as of 05/24/2019 - Review Complete 05/24/2019  Allergen Reaction Noted  . Celebrex [celecoxib]  11/24/2011  . Darvon [propoxyphene]  11/24/2011  . Duract [bromfenac]  11/24/2011  . Nitrofuran derivatives  05/16/2017  . Oxycodone-acetaminophen  11/11/2013  . Prednisone  11/24/2011  . Sulfa antibiotics  12/19/2014    Family History  Problem Relation Age of Onset  . Fibromyalgia  Mother   . Colon polyps Mother   . Diabetes Father   . Thyroid cancer Father   . Heart disease Father   . Dementia Father   . Colon polyps Father   . Colon cancer Neg Hx   . Esophageal cancer Neg Hx   . Liver cancer Neg Hx   . Pancreatic cancer Neg Hx   . Stomach cancer Neg Hx   . Rectal cancer Neg Hx     Social History   Socioeconomic History  . Marital status: Married    Spouse name: Not on file  . Number of children: 0  .  Years of education: 47  . Highest education level: Not on file  Occupational History  . Occupation: Futures trader  Tobacco Use  . Smoking status: Never Smoker  . Smokeless tobacco: Never Used  Substance and Sexual Activity  . Alcohol use: Yes    Comment: Consumes alcohol on occasion  . Drug use: No  . Sexual activity: Not on file  Other Topics Concern  . Not on file  Social History Narrative   Lives at home w/ her husband and dog   Patient drinks 1 cup of tea a day.    Patient is right handed.   Social Determinants of Health   Financial Resource Strain:   . Difficulty of Paying Living Expenses: Not on file  Food Insecurity:   . Worried About Charity fundraiser in the Last Year: Not on file  . Ran Out of Food in the Last Year: Not on file  Transportation Needs:   . Lack of Transportation (Medical): Not on file  . Lack of Transportation (Non-Medical): Not on file  Physical Activity:   . Days of Exercise per Week: Not on file  . Minutes of Exercise per Session: Not on file  Stress:   . Feeling of Stress : Not on file  Social Connections:   . Frequency of Communication with Friends and Family: Not on file  . Frequency of Social Gatherings with Friends and Family: Not on file  . Attends Religious Services: Not on file  . Active Member of Clubs or Organizations: Not on file  . Attends Archivist Meetings: Not on file  . Marital Status: Not on file  Intimate Partner Violence:   . Fear of Current or Ex-Partner: Not on file  .  Emotionally Abused: Not on file  . Physically Abused: Not on file  . Sexually Abused: Not on file    Review of Systems:    Constitutional: No weight loss, fever or chills Cardiovascular: No chest pain Respiratory: No SOB  Gastrointestinal: See HPI and otherwise negative   Physical Exam:  Vital signs: BP 132/70   Pulse 77   Temp 97.9 F (36.6 C)   Ht 5\' 5"  (1.651 m)   Wt 144 lb (65.3 kg)   BMI 23.96 kg/m   Constitutional:   Pleasant Caucasian female appears to be in NAD, Well developed, Well nourished, alert and cooperative Respiratory: Respirations even and unlabored. Lungs clear to auscultation bilaterally.   No wheezes, crackles, or rhonchi.  Cardiovascular: Normal S1, S2. No MRG. Regular rate and rhythm. No peripheral edema, cyanosis or pallor.  Gastrointestinal:  Soft, nondistended, mild epigastric ttp. No rebound or guarding. Normal bowel sounds. No appreciable masses or hepatomegaly. Rectal:  Not performed.  Psychiatric: Demonstrates good judgement and reason without abnormal affect or behaviors.  No recent labs.  Assessment: 1.  GERD: Breakthrough symptoms since Pantoprazole ran out 3 to 4 days ago 2.  Diarrhea: Ever since Covid infection, initially "black" per patient, now with lingering fatigue; likely diarrhea is postinfectious, but some concern over GI bleed with description of color, this has now returned to normal 3.  Recent Covid infection with lingering fatigue  Plan: 1.  Ordered labs to include a CBC and CMP.  Some concern about anemia given description of "black diarrhea" with recent Covid infection. 2.  Refilled Pantoprazole 40 mg daily, 30-60 minutes before breakfast #90 with 3 refills. 3.  Continue Famotidine 40 mg nightly.  Discussed that if she has breakthrough symptoms initially when returning  back to Pantoprazole due to flare of symptoms, she can increase Pepcid to twice daily for a month. 4.  Also recommend the patient start Align once daily over the  next month to help with diarrhea. 5.  If diarrhea worsens or does not get better would recommend that we do further evaluation with stool studies etc. 6.  Patient to follow in clinic with [place] as needed in the near future.  Ellouise Newer, PA-C [facility] 05/24/2019, 3:30 PM  Cc: Maylon Peppers, MD

## 2019-05-24 NOTE — Patient Instructions (Addendum)
If you are age 62 or older, your body mass index should be between 23-30. Your There is no height or weight on file to calculate BMI. If this is out of the aforementioned range listed, please consider follow up with your Primary Care Provider.  If you are age 91 or younger, your body mass index should be between 19-25. Your There is no height or weight on file to calculate BMI. If this is out of the aformentioned range listed, please consider follow up with your Primary Care Provider.   Your provider has requested that you go to the basement level for lab work before leaving today. Press "B" on the elevator. The lab is located at the first door on the left as you exit the elevator.  We have sent the following medications to your pharmacy for you to pick up at your convenience:  Protonix 40mg  daily  Please start on the probiotic Align daily for 1 month.  Due to recent changes in healthcare laws, you may see the results of your imaging and laboratory studies on MyChart before your provider has had a chance to review them.  We understand that in some cases there may be results that are confusing or concerning to you. Not all laboratory results come back in the same time frame and the provider may be waiting for multiple results in order to interpret others.  Please give [place] 48 hours in order for your provider to thoroughly review all the results before contacting the office for clarification of your results.   Thank you for choosing me and [facility]

## 2019-05-24 NOTE — Progress Notes (Signed)
Reviewed and agree with management plan.  Cassiel Fernandez T. Lea Walbert, MD FACG [facility]  

## 2019-06-21 ENCOUNTER — Telehealth: Payer: Self-pay

## 2019-06-21 NOTE — Telephone Encounter (Signed)
Referral notes sent from [facility], Bernerd Limbo, MD office, Phone #: [phone], Fax #: [phone]  Notes sent to scheduling

## 2019-07-06 ENCOUNTER — Other Ambulatory Visit: Payer: Self-pay

## 2019-07-06 ENCOUNTER — Encounter: Payer: Self-pay | Admitting: Neurology

## 2019-07-06 ENCOUNTER — Ambulatory Visit (INDEPENDENT_AMBULATORY_CARE_PROVIDER_SITE_OTHER): Payer: PRIVATE HEALTH INSURANCE | Admitting: Neurology

## 2019-07-06 VITALS — BP 116/68 | HR 86 | Temp 96.9°F | Ht 65.0 in | Wt 145.4 lb

## 2019-07-06 DIAGNOSIS — R413 Other amnesia: Secondary | ICD-10-CM | POA: Diagnosis not present

## 2019-07-06 DIAGNOSIS — M797 Fibromyalgia: Secondary | ICD-10-CM | POA: Diagnosis not present

## 2019-07-06 MED ORDER — ZOLPIDEM TARTRATE 5 MG PO TABS: 5.0000 mg | ORAL_TABLET | Freq: Every evening | ORAL | 3 refills | Status: DC | PRN

## 2019-07-06 NOTE — Progress Notes (Signed)
Reason for visit: Fibromyalgia, memory disturbance  Amanda Waters is an 64 y.o. female  History of present illness:  Amanda Waters is a 64 year old right-handed white female with a history of fibromyalgia and a history of a mild cognitive impairment.  The patient is involved in a [facility] Alzheimer's study, she is engaging in regular physical exercise and cognitive exercises.  The patient however contracted the Covid virus on around 21 March 2019.  The patient has not fully recovered from this, she still has some nasal stuffiness, she is having daily headaches, she is still having some GI symptoms.  Her cognitive issues have improved some following the illness but have not fully returned to her premorbid baseline.  The patient is having episodes of increased heart rate without any increase in physical activity that may go up to 122 as much is 160.  She will be seen by cardiology in the near future regarding this.  Thyroid studies have been unremarkable.  The patient does not appear to be orthostatic.  She did have a episode of fainting at the onset of the illness.  She has not had recurrence of the syncope.  Past Medical History:  Diagnosis Date  . Abnormal finding on MRI of brain 12/19/2014  . Adenomatous polyp of colon 2006  . Allergy   . Chronic back pain   . Dyslipidemia   . Fibromyalgia   . GERD (gastroesophageal reflux disease)   . History of carpal tunnel syndrome   . History of hemorrhoids   . HSV-1 (herpes simplex virus 1) infection   . HSV-2 (herpes simplex virus 2) infection   . Hyperlipidemia   . IBS (irritable bowel syndrome)   . 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 Iron deficiency anemia   . Kidney infection   . Kidney stones   . Left radial fracture    Radial head fracture  . Memory difficulty 12/16/2013  . Occipital neuralgia   . Osteopenia   . Sleep apnea   . Swollen lymph nodes     Past Surgical History:  Procedure  Laterality Date  . Broken Elbow Bilateral   . CHOLECYSTECTOMY    . COLONOSCOPY    . CYSTECTOMY    . DILATION AND CURETTAGE OF UTERUS    . hammartoe surgery Left    2nd toe  . POLYPECTOMY    . SHOULDER SURGERY    . TONSILLECTOMY      Family History  Problem Relation Age of Onset  . Fibromyalgia Mother   . Colon polyps Mother   . Diabetes Father   . Thyroid cancer Father   . Heart disease Father   . Dementia Father   . Colon polyps Father   . Colon cancer Neg Hx   . Esophageal cancer Neg Hx   . Liver cancer Neg Hx   . Pancreatic cancer Neg Hx   . Stomach cancer Neg Hx   . Rectal cancer Neg Hx     Social history:  reports that she has never smoked. She has never used smokeless tobacco. She reports current alcohol use. She reports that she does not use drugs.    Allergies  Allergen Reactions  . Celebrex [Celecoxib]   . Darvon [Propoxyphene]   . Duract [Bromfenac]   . Nitrofuran Derivatives     Headache   . Oxycodone-Acetaminophen   . Prednisone   . Sulfa Antibiotics     Medications:  Prior to Admission medications  Medication Sig Start Date End Date Taking? Authorizing Provider  b complex vitamins tablet Take 1 tablet by mouth daily.   Yes [provider]  Calcium Carbonate-Vitamin D (CALTRATE 600+D) 600-400 MG-UNIT per chew tablet Chew 1 tablet by mouth daily.   Yes [provider]  cyclobenzaprine (FLEXERIL) 10 MG tablet Take 10 mg by mouth 3 (three) times daily as needed.   Yes [provider]  diclofenac (FLECTOR) 1.3 % PTCH Place 1 patch onto the skin daily as needed.   Yes [provider]  famciclovir (FAMVIR) 500 MG tablet Take 500 mg by mouth daily.   Yes [provider]  famotidine (PEPCID) 20 MG tablet Take 20 mg by mouth at bedtime.   Yes [provider]  gabapentin (NEURONTIN) 600 MG tablet TAKE 1 TABLET BY MOUTH 3 TIMES DAILY 10/20/18  Yes Kathrynn Ducking, MD  HYDROcodone-acetaminophen  (NORCO/VICODIN) 5-325 MG per tablet Take 1 tablet by mouth every 6 (six) hours as needed for pain.   Yes [provider]  IMVEXXY MAINTENANCE PACK 10 MCG INST Place 1 capsule vaginally 2 (two) times a week. 06/11/19  Yes [provider]  loratadine (CLARITIN) 10 MG tablet Take 10 mg by mouth daily as needed.    Yes [provider]  Nutritional Supplements (DHEA PO) Take 5 mg by mouth daily.   Yes [provider]  pantoprazole (PROTONIX) 40 MG tablet TAKE 1 TABLET BY MOUTH EVERY DAY 05/24/19  Yes Levin Erp, PA  vitamin E 400 UNIT capsule Take 400 Units by mouth daily.   Yes [provider]  zolpidem (AMBIEN) 5 MG tablet Take 1 tablet (5 mg total) by mouth at bedtime as needed. for sleep 10/20/18  Yes Kathrynn Ducking, MD  zolpidem (AMBIEN) 5 MG tablet Take by mouth.   Yes [provider]    ROS:  Out of a complete 14 system review of symptoms, the patient complains only of the following symptoms, and all other reviewed systems are negative.  Syncope Fatigue Memory problems Increased heart rate  Blood pressure 116/68, pulse 86, temperature (!) 96.9 F (36.1 C), height 5\' 5"  (1.651 m), weight 145 lb 6.4 oz (66 kg).  Physical Exam  General: The patient is alert and cooperative at the time of the examination.  Skin: No significant peripheral edema is noted.   Neurologic Exam  Mental status: The patient is alert and oriented x 3 at the time of the examination. The patient has apparent normal recent and remote memory, with an apparently normal attention span and concentration ability.  Mini-Mental status examination done today shows a total score 29/30.  The patient is able to name 12 four-legged animals in 60 seconds.   Cranial nerves: Facial symmetry is present. Speech is normal, no aphasia or dysarthria is noted. Extraocular movements are full. Visual fields are full.  Motor: The patient has good strength in all 4  extremities.  Sensory examination: Soft touch sensation is symmetric on the face, arms, and legs.  Coordination: The patient has good finger-nose-finger and heel-to-shin bilaterally.  Gait and station: The patient has a normal gait. Tandem gait is normal. Romberg is negative. No drift is seen.  Reflexes: Deep tendon reflexes are symmetric.   MRI brain 10/14/17:  IMPRESSION:   MRI brain (without) demonstrating: - Mild scattered periventricular, subcortical and juxtacortical foci of gliosis. These findings are non-specific and considerations include autoimmune, inflammatory, post-infectious, microvascular ischemic or migraine associated etiologies.  - No acute findings.  *  MRI scan images were reviewed online. I agree with the written report.    Assessment/Plan:  1.  Minimum cognitive impairment  2.  Fibromyalgia  The patient is neurologically at her baseline.  She is now working part-time, she has had reduce stress associated with this.  She still has not fully recovered from the effects of the Covid virus.  She will follow-up here in 8 months.  She is involved in an Alzheimer's study through [facility].  Jill Alexanders MD 07/06/2019 9:26 AM  [facility] [address] [place], [place] [address]  Phone [phone] Fax [phone]

## 2019-07-16 ENCOUNTER — Other Ambulatory Visit: Payer: Self-pay

## 2019-07-16 ENCOUNTER — Ambulatory Visit (INDEPENDENT_AMBULATORY_CARE_PROVIDER_SITE_OTHER): Payer: PRIVATE HEALTH INSURANCE | Admitting: Interventional Cardiology

## 2019-07-16 ENCOUNTER — Telehealth: Payer: Self-pay | Admitting: Radiology

## 2019-07-16 ENCOUNTER — Encounter: Payer: Self-pay | Admitting: Interventional Cardiology

## 2019-07-16 VITALS — BP 134/84 | HR 88 | Ht 65.0 in | Wt 143.8 lb

## 2019-07-16 DIAGNOSIS — R000 Tachycardia, unspecified: Secondary | ICD-10-CM

## 2019-07-16 DIAGNOSIS — R002 Palpitations: Secondary | ICD-10-CM | POA: Diagnosis not present

## 2019-07-16 DIAGNOSIS — R55 Syncope and collapse: Secondary | ICD-10-CM | POA: Diagnosis not present

## 2019-07-16 DIAGNOSIS — Z8249 Family history of ischemic heart disease and other diseases of the circulatory system: Secondary | ICD-10-CM | POA: Diagnosis not present

## 2019-07-16 NOTE — Telephone Encounter (Signed)
Enrolled patient for a 14 day Zio monitor to be mailed to patients home.  

## 2019-07-16 NOTE — Patient Instructions (Signed)
Medication Instructions:  Your physician recommends that you continue on your current medications as directed. Please refer to the Current Medication list given to you today.  If you need a refill on your cardiac medications before your next appointment, please call your pharmacy.   Lab work: None Ordered  If you have labs (blood work) drawn today and your tests are completely normal, you will receive your results only by: Marland Kitchen MyChart Message (if you have MyChart) OR . A paper copy in the mail If you have any lab test that is abnormal or we need to change your treatment, we will call you to review the results.  Testing/Procedures: Your physician has requested that you have an echocardiogram. Echocardiography is a painless test that uses sound waves to create images of your heart. It provides your doctor with information about the size and shape of your heart and how well your heart's chambers and valves are working. This procedure takes approximately one hour. There are no restrictions for this procedure.  Your physician has recommended that you wear a 14 day monitor. These monitors are medical devices that record the heart's electrical activity. Doctors most often use these monitors to diagnose arrhythmias. Arrhythmias are problems with the speed or rhythm of the heartbeat. The monitor is a small, portable device. You can wear one while you do your normal daily activities. This is usually used to diagnose what is causing palpitations/syncope (passing out).  Follow-Up: . Based on test results  Any Other Special Instructions Will Be Listed Below (If Applicable).  ZIO XT- Long Term Monitor Instructions   Your physician has requested you wear your ZIO patch monitor 14 days.   This is a single patch monitor.  [employer] supplies one patch monitor per enrollment.  Additional stickers are not available.   Please do not apply patch if you will be having a Nuclear Stress Test, Echocardiogram, Cardiac  CT, MRI, or Chest Xray during the time frame you would be wearing the monitor. The patch cannot be worn during these tests.  You cannot remove and re-apply the ZIO XT patch monitor.   Your ZIO patch monitor will be sent USPS Priority mail from [facility] directly to your home address. The monitor may also be mailed to a PO BOX if home delivery is not available.   It may take 3-5 days to receive your monitor after you have been enrolled.   Once you have received you monitor, please review enclosed instructions.  Your monitor has already been registered assigning a specific monitor serial # to you.   Applying the monitor   Shave hair from upper left chest.   Hold abrader disc by orange tab.  Rub abrader in 40 strokes over left upper chest as indicated in your monitor instructions.   Clean area with 4 enclosed alcohol pads .  Use all pads to assure are is cleaned thoroughly.  Let dry.   Apply patch as indicated in monitor instructions.  Patch will be place under collarbone on left side of chest with arrow pointing upward.   Rub patch adhesive wings for 2 minutes.Remove white label marked "1".  Remove white label marked "2".  Rub patch adhesive wings for 2 additional minutes.   While looking in a mirror, press and release button in center of patch.  A small green light will flash 3-4 times .  This will be your only indicator the monitor has been turned on.     Do not shower for the  first 24 hours.  You may shower after the first 24 hours.   Press button if you feel a symptom. You will hear a small click.  Record Date, Time and Symptom in the Patient Log Book.   When you are ready to remove patch, follow instructions on last 2 pages of Patient Log Book.  Stick patch monitor onto last page of Patient Log Book.   Place Patient Log Book in [place] box.  Use locking tab on box and tape box closed securely.  The [employer] and [employer] has [employer] on it.  Please place in mailbox as soon  as possible.  Your physician should have your test results approximately 7 days after the monitor has been mailed back to [facility].   Call [place] at [phone] if you have questions regarding your ZIO XT patch monitor.  Call them immediately if you see an orange light blinking on your monitor.   If your monitor falls off in less than 4 days contact our Monitor department at [phone].  If your monitor becomes loose or falls off after 4 days call [employer] at [phone] for suggestions on securing your monitor.

## 2019-07-16 NOTE — Progress Notes (Signed)
Cardiology Office Note   Date:  07/16/2019   ID:  [name], [name] [birth year], MRN [id number]  PCP:  Bernerd Limbo, MD    No chief complaint on file.  palpitations  Wt Readings from Last 3 Encounters:  07/16/19 143 lb 12.8 oz (65.2 kg)  07/06/19 145 lb 6.4 oz (66 kg)  05/24/19 144 lb (65.3 kg)       History of Present Illness: Amanda Waters is a 65 y.o. female who is being seen today for the evaluation of palpitations at the request of Bernerd Limbo, MD.  She is part of an Alzheimers prevention program.  She was doing well until she got COVID in 03/2019.  She had been exercising regularly.  Noted some orthostatic sx.  She wears a fitbit as part of her study.  She quarantined at home with her COVID. No hospitalization.  She woke up at [facility] and went to the bathroom, and passed out after washing her hands.  She felt the room spinning.  She slid down to the floor.  She had to lie on the floor.  Unsure of how long she was out.  She did have a fever and this is how the COVID was diagnosed.  She was cold and clammy.  She had headaches.  Since COVID, she notes that her HR has been high, partcularly with activity- even light activity.  HR increasing out of proportion to activity level.   Denies : Chest pain. Dizziness. Leg edema. Nitroglycerin use. Orthopnea.  Paroxysmal nocturnal dyspnea. Shortness of breath. Syncope.    Past Medical History:  Diagnosis Date  . Abnormal finding on MRI of brain 12/19/2014  . Adenomatous polyp of colon 2006  . Allergy   . Chronic back pain   . Dyslipidemia   . Fibromyalgia   . GERD (gastroesophageal reflux disease)   . History of carpal tunnel syndrome   . History of hemorrhoids   . HSV-1 (herpes simplex virus 1) infection   . HSV-2 (herpes simplex virus 2) infection   . Hyperlipidemia   . IBS (irritable bowel syndrome)   . 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 Iron deficiency anemia   . Kidney  infection   . Kidney stones   . Left radial fracture    Radial head fracture  . Memory difficulty 12/16/2013  . Occipital neuralgia   . Osteopenia   . Sleep apnea   . Swollen lymph nodes     Past Surgical History:  Procedure Laterality Date  . Broken Elbow Bilateral   . CHOLECYSTECTOMY    . COLONOSCOPY    . CYSTECTOMY    . DILATION AND CURETTAGE OF UTERUS    . hammartoe surgery Left    2nd toe  . POLYPECTOMY    . SHOULDER SURGERY    . TONSILLECTOMY       Current Outpatient Medications  Medication Sig Dispense Refill  . b complex vitamins tablet Take 1 tablet by mouth daily.    . Calcium Carbonate-Vitamin D (CALTRATE 600+D) 600-400 MG-UNIT per chew tablet Chew 1 tablet by mouth daily.    . cyclobenzaprine (FLEXERIL) 10 MG tablet Take 10 mg by mouth 3 (three) times daily as needed.    . diclofenac (FLECTOR) 1.3 % PTCH Place 1 patch onto the skin daily as needed.    . famciclovir (FAMVIR) 500 MG tablet Take 500 mg by mouth daily.    . famotidine (PEPCID) 20 MG tablet Take  20 mg by mouth at bedtime.    . gabapentin (NEURONTIN) 600 MG tablet TAKE 1 TABLET BY MOUTH 3 TIMES DAILY 270 tablet 3  . HYDROcodone-acetaminophen (NORCO/VICODIN) 5-325 MG per tablet Take 1 tablet by mouth every 6 (six) hours as needed for pain.    Harlow Mares MAINTENANCE PACK 10 MCG INST Place 1 capsule vaginally 2 (two) times a week.    . loratadine (CLARITIN) 10 MG tablet Take 10 mg by mouth daily as needed.     . Nutritional Supplements (DHEA PO) Take 5 mg by mouth daily.    . pantoprazole (PROTONIX) 40 MG tablet TAKE 1 TABLET BY MOUTH EVERY DAY 90 tablet 3  . vitamin E 400 UNIT capsule Take 400 Units by mouth daily.    Marland Kitchen zolpidem (AMBIEN) 5 MG tablet Take 1 tablet (5 mg total) by mouth at bedtime as needed. for sleep 30 tablet 3   No current facility-administered medications for this visit.    Allergies:   Celebrex [celecoxib], Darvon [propoxyphene], Duract [bromfenac], Nitrofuran derivatives,  Oxycodone-acetaminophen, Prednisone, and Sulfa antibiotics    Social History:  The patient  reports that she has never smoked. She has never used smokeless tobacco. She reports current alcohol use. She reports that she does not use drugs.   Family History:  The patient's family history includes Colon polyps in her father and mother; Dementia in her father; Diabetes in her father; Fibromyalgia in her mother; Heart disease in her father; Thyroid cancer in her father.    ROS:  Please see the history of present illness.   Otherwise, review of systems are positive for palpitations.   All other systems are reviewed and negative.    PHYSICAL EXAM: VS:  BP 134/84   Pulse 88   Ht 5\' 5"  (1.651 m)   Wt 143 lb 12.8 oz (65.2 kg)   SpO2 99%   BMI 23.93 kg/m  , BMI Body mass index is 23.93 kg/m. GEN: Well nourished, well developed, in no acute distress  HEENT: normal  Neck: no JVD, carotid bruits, or masses Cardiac: RRR; no murmurs, rubs, or gallops,no edema  Respiratory:  clear to auscultation bilaterally, normal work of breathing GI: soft, nontender, nondistended, + BS MS: no deformity or atrophy  Skin: warm and dry, no rash Neuro:  Strength and sensation are intact Psych: euthymic mood, full affect   EKG:   The ekg ordered today demonstrates NSR, PACs, no ST segment changes    Recent Labs: 05/24/2019: ALT 26; BUN 13; Creatinine, Ser 0.92; Hemoglobin 12.7; Platelets 311.0; Potassium 4.3; Sodium 140   Lipid Panel No results found for: CHOL, TRIG, HDL, CHOLHDL, VLDL, LDLCALC, LDLDIRECT   Other studies Reviewed: Additional studies/ records that were reviewed today with results demonstrating: [facility] records reviewed.  Patient Fitbit records reviewed as well.    ASSESSMENT AND PLAN:  1. Tachycardia: Presumed sinus tach.  Plan for 14 day Zio monitor to eval for any pathologic arrhythmia. Plan for echo.  If echo ok with normal LVEF, ok to resume exercise and increase  gradually. 2. Palpitations: Hopefully, this is sinus tach.  3. Improving in general from her COVID infection in 03/2019. 4. FAmily h/o of CAD.  Brother with an MI at age 67.  Se avoids tobacco.  Healthy diet and regular exercise.  She is watching cholesterol with PMD as well.  5. Syncope during COVID.   Current medicines are reviewed at length with the patient today.  The patient concerns regarding her medicines were  addressed.  The following changes have been made:  No change  Labs/ tests ordered today include:  No orders of the defined types were placed in this encounter.   Recommend 150 minutes/week of aerobic exercise Low fat, low carb, high fiber diet recommended  Disposition:   FU based on tests   Signed, Larae Grooms, MD  07/16/2019 11:35 AM    [facility] [place], [place], [place]  [address] Phone: [phone]; Fax: [phone]

## 2019-07-30 ENCOUNTER — Other Ambulatory Visit: Payer: Self-pay

## 2019-07-30 ENCOUNTER — Ambulatory Visit (HOSPITAL_COMMUNITY): Payer: PRIVATE HEALTH INSURANCE | Attending: Cardiovascular Disease

## 2019-07-30 DIAGNOSIS — R000 Tachycardia, unspecified: Secondary | ICD-10-CM | POA: Diagnosis not present

## 2019-08-01 ENCOUNTER — Other Ambulatory Visit (INDEPENDENT_AMBULATORY_CARE_PROVIDER_SITE_OTHER): Payer: PRIVATE HEALTH INSURANCE

## 2019-08-01 DIAGNOSIS — R002 Palpitations: Secondary | ICD-10-CM | POA: Diagnosis not present

## 2019-08-06 ENCOUNTER — Ambulatory Visit: Payer: PRIVATE HEALTH INSURANCE | Attending: Internal Medicine

## 2019-08-06 DIAGNOSIS — Z23 Encounter for immunization: Secondary | ICD-10-CM

## 2019-08-06 NOTE — Progress Notes (Signed)
   Covid-19 Vaccination Clinic  Name:  Amanda Waters    MRN: [id number] DOB: 27-Oct-1955  08/06/2019  [name] was observed post Covid-19 immunization for 15 minutes without incident. She was provided with Vaccine Information Sheet and instruction to access the V-Safe system.   [name] was instructed to call 911 with any severe reactions post vaccine: Marland Kitchen Difficulty breathing  . Swelling of face and throat  . A fast heartbeat  . A bad rash all over body  . Dizziness and weakness   Immunizations Administered    Name Date Dose VIS Date Route   Pfizer COVID-19 Vaccine 08/06/2019  2:49 PM 0.3 mL 04/30/2019 Intramuscular   Manufacturer: [place]   Lot: [id number]   [place]: [id number]

## 2019-08-26 ENCOUNTER — Telehealth: Payer: Self-pay

## 2019-08-26 NOTE — Telephone Encounter (Signed)
-----   Message from Jettie Booze, MD sent at 08/25/2019  5:56 PM EDT ----- As noted in results.

## 2019-08-26 NOTE — Telephone Encounter (Signed)
The patient has been notified of the monitor result and verbalized understanding.  All questions (if any) were answered. Frederik Schmidt, RN 08/26/2019 10:20 AM

## 2019-08-26 NOTE — Telephone Encounter (Signed)
lpmtcb 4/8

## 2019-08-31 ENCOUNTER — Ambulatory Visit: Payer: PRIVATE HEALTH INSURANCE | Attending: Internal Medicine

## 2019-08-31 DIAGNOSIS — Z23 Encounter for immunization: Secondary | ICD-10-CM

## 2019-08-31 NOTE — Progress Notes (Signed)
   Covid-19 Vaccination Clinic  Name:  SHAUN RUBEL    MRN: [id number] DOB: [birth year]  08/31/2019  Ms. Thiam was observed post Covid-19 immunization for 15 minutes without incident. She was provided with Vaccine Information Sheet and instruction to access the V-Safe system.   Ms. Byrnes was instructed to call 911 with any severe reactions post vaccine: Marland Kitchen Difficulty breathing  . Swelling of face and throat  . A fast heartbeat  . A bad rash all over body  . Dizziness and weakness   Immunizations Administered    Name Date Dose VIS Date Route   Pfizer COVID-19 Vaccine 08/31/2019  3:30 PM 0.3 mL 04/30/2019 Intramuscular   Manufacturer: [place]   Lot: [id number]   [place]: [id number]

## 2019-11-09 ENCOUNTER — Other Ambulatory Visit: Payer: Self-pay | Admitting: Neurology

## 2020-01-24 ENCOUNTER — Other Ambulatory Visit: Payer: Self-pay | Admitting: Neurology

## 2020-03-14 ENCOUNTER — Other Ambulatory Visit: Payer: Self-pay

## 2020-03-14 ENCOUNTER — Ambulatory Visit (INDEPENDENT_AMBULATORY_CARE_PROVIDER_SITE_OTHER): Payer: PRIVATE HEALTH INSURANCE | Admitting: Neurology

## 2020-03-14 ENCOUNTER — Encounter: Payer: Self-pay | Admitting: Neurology

## 2020-03-14 VITALS — BP 132/79 | HR 94 | Ht 65.0 in | Wt 147.5 lb

## 2020-03-14 DIAGNOSIS — R413 Other amnesia: Secondary | ICD-10-CM | POA: Diagnosis not present

## 2020-03-14 DIAGNOSIS — M797 Fibromyalgia: Secondary | ICD-10-CM

## 2020-03-14 MED ORDER — ZOLPIDEM TARTRATE 5 MG PO TABS: 5.0000 mg | ORAL_TABLET | Freq: Every evening | ORAL | 3 refills | Status: DC | PRN

## 2020-03-14 MED ORDER — NORTRIPTYLINE HCL 10 MG PO CAPS: ORAL_CAPSULE | ORAL | 3 refills | Status: DC

## 2020-03-14 NOTE — Progress Notes (Signed)
Reason for visit: Mild cognitive impairment, fibromyalgia, headache  Amanda Waters is an 64 y.o. female  History of present illness:  Amanda Waters is a 64 year old right-handed white female with a history of fibromyalgia.  The patient has mild cognitive impairment, she has not given up any activities of daily living because of memory, she reports that she is having increasing problems with short-term memory.  She is in a study through [facility].  This study will end in June 2022.  The patient has had worsening headaches following the Covid virus infection a year ago, but the headaches seem to improve after a vaccination in April 2021.  Within the last 3 months however the headaches have returned and once again and are daily in nature, associated with some neck stiffness and shoulder pain as well.  The patient is taking Tylenol around-the-clock, when the Tylenol wears off headache returns.  She reports no nausea or vomiting or any vision changes with the headache.  About once a week she has to lay down because of the headache.  She tries to do stretches for her neck and shoulder which seems to help some.  She comes back here for further evaluation.  She remains on gabapentin, she tolerates those fairly well.  Past Medical History:  Diagnosis Date  . Abnormal finding on MRI of brain 12/19/2014  . Adenomatous polyp of colon 2006  . Allergy   . Chronic back pain   . Dyslipidemia   . Fibromyalgia   . GERD (gastroesophageal reflux disease)   . History of carpal tunnel syndrome   . History of hemorrhoids   . HSV-1 (herpes simplex virus 1) infection   . HSV-2 (herpes simplex virus 2) infection   . Hyperlipidemia   . IBS (irritable bowel syndrome)   . 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 Iron deficiency anemia   . Kidney infection   . Kidney stones   . Left radial fracture    Radial head fracture  . Memory difficulty 12/16/2013  . Occipital neuralgia   .  Osteopenia   . Sleep apnea   . Swollen lymph nodes     Past Surgical History:  Procedure Laterality Date  . Broken Elbow Bilateral   . CHOLECYSTECTOMY    . COLONOSCOPY    . CYSTECTOMY    . DILATION AND CURETTAGE OF UTERUS    . hammartoe surgery Left    2nd toe  . POLYPECTOMY    . SHOULDER SURGERY    . TONSILLECTOMY      Family History  Problem Relation Age of Onset  . Fibromyalgia Mother   . Colon polyps Mother   . Diabetes Father   . Thyroid cancer Father   . Heart disease Father   . Dementia Father   . Colon polyps Father   . Colon cancer Neg Hx   . Esophageal cancer Neg Hx   . Liver cancer Neg Hx   . Pancreatic cancer Neg Hx   . Stomach cancer Neg Hx   . Rectal cancer Neg Hx     Social history:  reports that she has never smoked. She has never used smokeless tobacco. She reports current alcohol use. She reports that she does not use drugs.    Allergies  Allergen Reactions  . Celebrex [Celecoxib]   . Darvon [Propoxyphene]   . Duract [Bromfenac]   . Nitrofuran Derivatives     Headache   . Oxycodone-Acetaminophen   .  Prednisone   . Sulfa Antibiotics     Medications:  Prior to Admission medications   Medication Sig Start Date End Date Taking? Authorizing Provider  b complex vitamins tablet Take 1 tablet by mouth daily.    [provider]  Calcium Carbonate-Vitamin D (CALTRATE 600+D) 600-400 MG-UNIT per chew tablet Chew 1 tablet by mouth daily.    [provider]  cyclobenzaprine (FLEXERIL) 10 MG tablet Take 10 mg by mouth 3 (three) times daily as needed.    [provider]  diclofenac (FLECTOR) 1.3 % PTCH Place 1 patch onto the skin daily as needed.    [provider]  famciclovir (FAMVIR) 500 MG tablet Take 500 mg by mouth daily.    [provider]  famotidine (PEPCID) 20 MG tablet Take 20 mg by mouth at bedtime.    [provider]  gabapentin (NEURONTIN) 600 MG tablet TAKE 1 TABLET BY MOUTH THREE TIMES  A DAY 11/09/19   Kathrynn Ducking, MD  HYDROcodone-acetaminophen (NORCO/VICODIN) 5-325 MG per tablet Take 1 tablet by mouth every 6 (six) hours as needed for pain.    [provider]  IMVEXXY MAINTENANCE PACK 10 MCG INST Place 1 capsule vaginally 2 (two) times a week. 06/11/19   [provider]  loratadine (CLARITIN) 10 MG tablet Take 10 mg by mouth daily as needed.     [provider]  Nutritional Supplements (DHEA PO) Take 5 mg by mouth daily.    [provider]  pantoprazole (PROTONIX) 40 MG tablet TAKE 1 TABLET BY MOUTH EVERY DAY 05/24/19   Levin Erp, PA  vitamin E 400 UNIT capsule Take 400 Units by mouth daily.    [provider]  zolpidem (AMBIEN) 5 MG tablet TAKE 1 TABLET (5 MG TOTAL) BY MOUTH AT BEDTIME AS NEEDED. FOR SLEEP 01/26/20   Penumalli, Earlean Polka, MD    ROS:  Out of a complete 14 system review of symptoms, the patient complains only of the following symptoms, and all other reviewed systems are negative.  Headache Neck and shoulder pain Memory problems  There were no vitals taken for this visit.  Physical Exam  General: The patient is alert and cooperative at the time of the examination.  Skin: No significant peripheral edema is noted.   Neurologic Exam  Mental status: The patient is alert and oriented x 3 at the time of the examination. The patient has apparent normal recent and remote memory, with an apparently normal attention span and concentration ability.   Cranial nerves: Facial symmetry is present. Speech is normal, no aphasia or dysarthria is noted. Extraocular movements are full. Visual fields are full.  Motor: The patient has good strength in all 4 extremities.  Sensory examination: Soft touch sensation is symmetric on the face, arms, and legs.  Coordination: The patient has good finger-nose-finger and heel-to-shin bilaterally.  Gait and station: The patient has a normal gait. Tandem gait is normal.  Romberg is negative. No drift is seen.  Reflexes: Deep tendon reflexes are symmetric.   Assessment/Plan:  1.  Chronic daily headache  2.  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  Fibromyalgia  The patient will be continued on the gabapentin, she will be started on nortriptyline for the headache and neck pain.  A prescription was sent in for the Ambien.  She will follow-up here in 6 months.  We will follow the memory issues over time.  Jill Alexanders MD 03/14/2020 10:03 AM  [facility]  [address] [place], [place] [address]  Phone [phone] Fax [phone]

## 2020-03-14 NOTE — Patient Instructions (Signed)
We will start nortriptyline for the headache the neck and shoulder discomfort.  Pamelor (nortriptyline) is an antidepressant medication that has many uses that may include headache, whiplash injuries, or for peripheral neuropathy pain. Side effects may include drowsiness, dry mouth, blurred vision, or constipation. As with any antidepressant medication, worsening depression may occur. If you had any significant side effects, please call our office. The full effects of this medication may take 7-10 days after starting the drug, or going up on the dose.

## 2020-04-06 ENCOUNTER — Other Ambulatory Visit: Payer: Self-pay | Admitting: Neurology

## 2020-04-12 ENCOUNTER — Other Ambulatory Visit: Payer: Self-pay | Admitting: Neurology

## 2020-05-31 ENCOUNTER — Encounter: Payer: Self-pay | Admitting: Orthopaedic Surgery

## 2020-05-31 ENCOUNTER — Other Ambulatory Visit: Payer: Self-pay

## 2020-05-31 ENCOUNTER — Ambulatory Visit (INDEPENDENT_AMBULATORY_CARE_PROVIDER_SITE_OTHER): Payer: PRIVATE HEALTH INSURANCE

## 2020-05-31 ENCOUNTER — Ambulatory Visit (INDEPENDENT_AMBULATORY_CARE_PROVIDER_SITE_OTHER): Payer: PRIVATE HEALTH INSURANCE | Admitting: Orthopaedic Surgery

## 2020-05-31 VITALS — Ht 65.0 in | Wt 147.5 lb

## 2020-05-31 DIAGNOSIS — M25551 Pain in right hip: Secondary | ICD-10-CM

## 2020-05-31 DIAGNOSIS — M7061 Trochanteric bursitis, right hip: Secondary | ICD-10-CM

## 2020-05-31 MED ORDER — LIDOCAINE HCL 1 % IJ SOLN
3.0000 mL | INTRAMUSCULAR | Status: AC | PRN
  Administered 2020-05-31: 3 mL

## 2020-05-31 MED ORDER — METHYLPREDNISOLONE ACETATE 40 MG/ML IJ SUSP
40.0000 mg | INTRAMUSCULAR | Status: AC | PRN
  Administered 2020-05-31: 40 mg via INTRA_ARTICULAR

## 2020-05-31 NOTE — Progress Notes (Signed)
Office Visit Note   Patient: Amanda Waters           Date of Birth: 04/02/1956           MRN: [id number] Visit Date: 05/31/2020              Requested by: Bernerd Limbo, MD [place] [id number] PCP: Bernerd Limbo, MD   Assessment & Plan: Visit Diagnoses:  1. Pain in right hip   2. Trochanteric bursitis, right hip     Plan: I do feel like her signs and symptoms are consistent with right hip trochanteric bursitis and proximal IT band syndrome. She would definitely benefit from formal physical therapy within the modalities per the therapist to help decrease her pain and improve her mobility with her right hip trochanteric bursitis and IT band syndrome. I did offer steroid injection in this area and she agreed to this and tolerated well. I have also recommended Voltaren gel. I have shown her some stretching exercises to try as well. All questions and concerns were answered and addressed. We will see her back in 6 weeks after course of outpatient physical therapy. Even at 6 weeks I would consider a repeat injection if needed.  Follow-Up Instructions: Return in about 6 weeks (around 07/12/2020).   Orders:  Orders Placed This Encounter  Procedures  . Large Joint Inj  . XR HIP UNILAT W OR W/O PELVIS 1V RIGHT   No orders of the defined types were placed in this encounter.     Procedures: Large Joint Inj: R greater trochanter on 05/31/2020 3:29 PM Indications: pain and diagnostic evaluation Details: 22 G 1.5 in needle, lateral approach  Arthrogram: No  Medications: 3 mL lidocaine 1 %; 40 mg methylPREDNISolone acetate 40 MG/ML Outcome: tolerated well, no immediate complications Procedure, treatment alternatives, risks and benefits explained, specific risks discussed. Consent was given by the patient. Immediately prior to procedure a time out was called to verify the correct patient, procedure, equipment, support staff and site/side marked as  required. Patient was prepped and draped in the usual sterile fashion.       Clinical Data: No additional findings.   Subjective: Chief Complaint  Patient presents with  . Right Hip - Pain  The patient comes in today for evaluation treatment of right hip pain has been getting worse since mid-to-late December. She denies any injuries. She points to the lateral aspect of her hip as a source of her pain. She denies any groin pain. She said it has been keeping her from activity such as participating in yoga. She is an Futures trader. At 1 point she was standing for about 5 hours on concrete with one of her jobs that may have contributed to some of this lateral hip pain. She denies any injury that she is aware of. She is not a diabetic. She is a thin individual and very active.  HPI  Review of Systems She currently denies any headache, chest pain, shortness of breath, fever, chills, nausea, vomiting  Objective: Vital Signs: Ht 5\' 5"  (1.651 m)   Wt 147 lb 8 oz (66.9 kg)   BMI 24.55 kg/m   Physical Exam She is alert and oriented x3 and in no acute distress Ortho Exam Examination of her right hip shows fluid and full range of motion with no pain going well. When I have her lay on her side she has severe pain to palpation of the trochanteric area and  the proximal IT band. Her leg lengths are equal. Her back exam is normal and she has excellent strength in her bilateral lower extremities. Specialty Comments:  No specialty comments available.  Imaging: XR HIP UNILAT W OR W/O PELVIS 1V RIGHT  Result Date: 05/31/2020 An AP pelvis and lateral right hip shows normal-appearing hips bilaterally with no acute findings.    PMFS History: Patient Active Problem List   Diagnosis Date Noted  . Multiple joint pain 09/26/2016  . Other fatigue 09/26/2016  . Myalgia 09/26/2016  . Abnormal finding on MRI of brain 12/19/2014  . Memory difficulty 12/16/2013  . Fibromyalgia 12/16/2012  . Other  nonspecific abnormal result of function study of brain and central nervous system 04/07/2012  . Disturbance of skin sensation 04/07/2012  . Headache(784.0) 04/07/2012   Past Medical History:  Diagnosis Date  . Abnormal finding on MRI of brain 12/19/2014  . Adenomatous polyp of colon 2006  . Allergy   . Chronic back pain   . Dyslipidemia   . Fibromyalgia   . GERD (gastroesophageal reflux disease)   . History of carpal tunnel syndrome   . History of hemorrhoids   . HSV-1 (herpes simplex virus 1) infection   . HSV-2 (herpes simplex virus 2) infection   . Hyperlipidemia   . IBS (irritable bowel syndrome)   . 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 Iron deficiency anemia   . Kidney infection   . Kidney stones   . Left radial fracture    Radial head fracture  . Memory difficulty 12/16/2013  . Occipital neuralgia   . Osteopenia   . Sleep apnea   . Swollen lymph nodes     Family History  Problem Relation Age of Onset  . Fibromyalgia Mother   . Colon polyps Mother   . Diabetes Father   . Thyroid cancer Father   . Heart disease Father   . Dementia Father   . Colon polyps Father   . Colon cancer Neg Hx   . Esophageal cancer Neg Hx   . Liver cancer Neg Hx   . Pancreatic cancer Neg Hx   . Stomach cancer Neg Hx   . Rectal cancer Neg Hx     Past Surgical History:  Procedure Laterality Date  . Broken Elbow Bilateral   . CHOLECYSTECTOMY    . COLONOSCOPY    . CYSTECTOMY    . DILATION AND CURETTAGE OF UTERUS    . hammartoe surgery Left    2nd toe  . POLYPECTOMY    . SHOULDER SURGERY    . TONSILLECTOMY     Social History   Occupational History  . Occupation: Futures trader    Comment: part time  Tobacco Use  . Smoking status: Never Smoker  . Smokeless tobacco: Never Used  Substance and Sexual Activity  . Alcohol use: Yes    Comment: Consumes alcohol on occasion  . Drug use: No  . Sexual activity: Not on file

## 2020-06-21 ENCOUNTER — Other Ambulatory Visit: Payer: Self-pay | Admitting: Physician Assistant

## 2020-06-21 NOTE — Telephone Encounter (Signed)
Patient is requesting refill, was seen 13 months ago. I will refill for 3 months, but needs to make appointment for follow up for further refills.  Thank you, Ellouise Newer, PA-C

## 2020-06-21 NOTE — Telephone Encounter (Signed)
MEDICATION REFILL REQUEST  Date of last office visit 05/24/19  Follow up recommendation   Patient to follow in clinic with [place] as needed in the near future.  Cancels/No show? None  Future office visit scheduled? No  Please advise on refill.

## 2020-07-04 ENCOUNTER — Encounter (HOSPITAL_BASED_OUTPATIENT_CLINIC_OR_DEPARTMENT_OTHER): Payer: Self-pay | Admitting: Physical Therapy

## 2020-07-04 ENCOUNTER — Ambulatory Visit (HOSPITAL_BASED_OUTPATIENT_CLINIC_OR_DEPARTMENT_OTHER): Payer: PRIVATE HEALTH INSURANCE | Attending: Orthopaedic Surgery | Admitting: Physical Therapy

## 2020-07-04 ENCOUNTER — Other Ambulatory Visit: Payer: Self-pay

## 2020-07-04 DIAGNOSIS — M6281 Muscle weakness (generalized): Secondary | ICD-10-CM | POA: Insufficient documentation

## 2020-07-04 DIAGNOSIS — R2689 Other abnormalities of gait and mobility: Secondary | ICD-10-CM | POA: Insufficient documentation

## 2020-07-04 DIAGNOSIS — M25551 Pain in right hip: Secondary | ICD-10-CM | POA: Insufficient documentation

## 2020-07-04 DIAGNOSIS — M25651 Stiffness of right hip, not elsewhere classified: Secondary | ICD-10-CM

## 2020-07-04 NOTE — Addendum Note (Signed)
Addended by: Colbert Ewing MARIE L on: 07/04/2020 11:40 AM   Modules accepted: Orders

## 2020-07-04 NOTE — Patient Instructions (Signed)
Access Code: [email or web address] URL: [email or web address] Date: 07/04/2020 Prepared by: Estill Bamberg April Thurnell Garbe  Exercises Hooklying Clamshell with Resistance - 1 x daily - 7 x weekly - 2 sets - 10 reps Supine Bridge - 1 x daily - 7 x weekly - 2 sets - 10 reps [place] - 1 x daily - 7 x weekly - 3 sets - 5-10 sec hold

## 2020-07-04 NOTE — Therapy (Signed)
[place], [place], [address] Phone: [phone]   Fax:  [phone]  Physical Therapy Evaluation  Patient Details  Name: Amanda Waters MRN: [id number] Date of Birth: 09/14/1955 Referring Provider (PT): Mcarthur Rossetti, MD   Encounter Date: 07/04/2020   PT End of Session - 07/04/20 1022    Visit Number 1    Number of Visits 13    Date for PT Re-Evaluation 08/15/20    Authorization Type [employer]    PT Start Time 1025    PT Stop Time 1110    PT Time Calculation (min) 45 min    Activity Tolerance Patient tolerated treatment well    Behavior During Therapy [facility] for tasks assessed/performed           Past Medical History:  Diagnosis Date  . Abnormal finding on MRI of brain 12/19/2014  . Adenomatous polyp of colon 2006  . Allergy   . Chronic back pain   . Dyslipidemia   . Fibromyalgia   . GERD (gastroesophageal reflux disease)   . History of carpal tunnel syndrome   . History of hemorrhoids   . HSV-1 (herpes simplex virus 1) infection   . HSV-2 (herpes simplex virus 2) infection   . Hyperlipidemia   . IBS (irritable bowel syndrome)   . 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 Iron deficiency anemia   . Kidney infection   . Kidney stones   . Left radial fracture    Radial head fracture  . Memory difficulty 12/16/2013  . Occipital neuralgia   . Osteopenia   . Sleep apnea   . Swollen lymph nodes     Past Surgical History:  Procedure Laterality Date  . Broken Elbow Bilateral   . CHOLECYSTECTOMY    . COLONOSCOPY    . CYSTECTOMY    . DILATION AND CURETTAGE OF UTERUS    . hammartoe surgery Left    2nd toe  . POLYPECTOMY    . SHOULDER SURGERY    . TONSILLECTOMY      There were no vitals filed for this visit.    Subjective Assessment - 07/04/20 1025    Subjective Pt reports R hip pain. Pt with reportedly large bursa. X-rays normal. Pt was  given steroid shot that alleviated pain. Pt was given shot ~3 wks ago. Pt likes sleeping on R side but is not able to right now. Pt is on feet for 5-6 hrs flexing forward to put up backslash on concrete slab and this exacerbated it initially (before Christmas). Pt is a race walker for exercises and does yoga stretches.    Pertinent History chronic back pain, dyslipidemia, fibromyalgia, GERD, carpal tunnel, IBS, hyperlipidemia, kidney infection & stones, occipital neuralgia, osteopenia    Limitations Standing;Walking;House hold activities    How long can you sit comfortably? n/a    How long can you stand comfortably? After sitting too long immediate discomfort with standing    How long can you walk comfortably? ~20 minutes    Diagnostic tests XR 05/31/20: An AP pelvis and lateral right hip shows normal-appearing hips bilaterally with no acute findings.    Patient Stated Goals Continue to improve pain    Currently in Pain? Yes    Pain Score 3    at worst 6 or 7/10   Pain Location Hip    Pain Orientation Left    Pain Descriptors / Indicators Tender    Pain Type  Chronic pain    Aggravating Factors  Extreme physical activity and inactivity    Pain Relieving Factors Steroid shot    Effect of Pain on Daily Activities Unable to return to race walking              [facility] PT Assessment - 07/04/20 0001      Assessment   Medical Diagnosis M25.551 (ICD-10-CM) - Pain in right hip  M70.61 (ICD-10-CM) - Trochanteric bursitis, right hip    Referring Provider (PT) Mcarthur Rossetti, MD    Onset Date/Surgical Date --   Before Christmas of 2021   Prior Therapy Back -- 6 to 7 years ago      Balance Screen   Has the patient fallen in the past 6 months No      [place] residence      Prior Function   Level of Independence Independent    Vocation Full time employment    Academic librarian    Leisure Yoga, race walking       Observation/Other Assessments   Focus on Therapeutic Outcomes (FOTO)  n/a      Functional Tests   Functional tests Squat;Single leg stance      Squat   Comments 3 squats with fatigue; flattened back      Single Leg Stance   Comments L LE: 30 sec; R LE: 30 sec with higher R iliac crest      Posture/Postural Control   Posture/Postural Control Postural limitations    Postural Limitations Decreased lumbar lordosis;Posterior pelvic tilt;Right pelvic obliquity;Weight shift right   R iliac crest higher than L     Strength   Overall Strength Comments L knee flexion & extension 4/5;    Right Hip Flexion 4+/5    Right Hip Extension 3/5    Right Hip External Rotation  3/5   with pain   Right Hip Internal Rotation 4/5    Right Hip ABduction 3+/5      Flexibility   Hamstrings WFL      Special Tests    Special Tests Sacrolliac Tests;Hip Special Tests    Sacroiliac Tests  Sacral Compression    Hip Special Tests  Saralyn Pilar (FABER) Test;Trendelenberg Test;Ober's Test      Sacral thrust    Findings Positive    Side Right      Sacral Compression   Findings Positive    Side  Right      Saralyn Pilar (FABER) Test   Findings Positive    Side Right      Trendelenburg Test   Findings Positive    Side Right      Ober's Test   Findings Positive    Side Right                      Objective measurements completed on examination: See above findings.               PT Education - 07/04/20 1118    Education Details Discussed exam findings and POC. Discussed initial HEP    Person(s) Educated Patient    Methods Explanation;Demonstration;Verbal cues;Handout    Comprehension Verbalized understanding;Tactile cues required;Verbal cues required;Returned demonstration            PT Short Term Goals - 07/04/20 1128      PT SHORT TERM GOAL #1   Title Pt will be independent with initial HEP    Baseline Newly provided  Time 3    Period Weeks    Status New    Target  Date 07/25/20      PT SHORT TERM GOAL #2   Title Pt will be able to tolerate squatting 10x with 10 lb weight to demo improved functional LE strength    Baseline Fatigue after 3 squats    Time 3    Period Weeks    Status New    Target Date 07/25/20      PT SHORT TERM GOAL #3   Title Pt will report decrease in pain to </=4/10 in the evenings    Baseline Hurts worst later in the day (at worst 6 or 7/10)    Time 3    Period Weeks    Status New    Target Date 07/25/20             PT Long Term Goals - 07/04/20 1130      PT LONG TERM GOAL #1   Title Pt will be able to return to leisure activities (race walking) without pain    Baseline Currently on hold    Time 6    Period Weeks    Status New    Target Date 08/15/20      PT LONG TERM GOAL #2   Title Pt will be able to squat at least 25lbs to demo increased functional strength    Time 6    Period Weeks    Status New    Target Date 08/15/20      PT LONG TERM GOAL #3   Title Pt will be able to perform single leg dead lift x10 on R LE with at least 5 lbs to demo improved R LE hamstring and glute stability    Baseline Currently only able to tolerate 5 sec of Captain Morgans against wall    Time 6    Period Weeks    Status New    Target Date 08/15/20                  Plan - 07/04/20 1119    Clinical Impression Statement Mrs. Norling is an active 65 y/o F presenting to OPPT due to R hip pain. Pt recently given steroid injection which has helped her pain immensely. Pt's sign and symptoms consistent with R hip bursitis + ITB syndrome (R greater trochanter tender to palpation) with likely continued aggravation by pelvic instability (R>L LE length with R iliac crest higher than L iliac crest during standing), decreased R LE strength, and decreased hip muscle endurance. Pt would benefit from PT to address these issues to further improve her pain and function for home and work tasks.    Personal Factors and Comorbidities  Age;Comorbidity 1;Comorbidity 3+;Comorbidity 2;Time since onset of injury/illness/exacerbation    Comorbidities chronic back pain, dyslipidemia, fibromyalgia, GERD, carpal tunnel, IBS, hyperlipidemia, kidney infection & stones, occipital neuralgia, osteopenia    Examination-Activity Limitations Bend;Locomotion Level;Squat;Stand;Stairs;Transfers    Examination-Participation Restrictions Cleaning;Community Activity;Occupation    Stability/Clinical Decision Making Evolving/Moderate complexity    Clinical Decision Making Moderate    Rehab Potential Good    PT Frequency 2x / week    PT Duration 6 weeks    PT Treatment/Interventions ADLs/Self Care Home Management;Aquatic Therapy;Electrical Stimulation;Moist Heat;Ultrasound;DME Instruction;Gait training;Stair training;Functional mobility training;Therapeutic activities;Neuromuscular re-education;Balance training;Therapeutic exercise;Manual techniques;Patient/family education;Dry needling;Vasopneumatic Device    PT Next Visit Plan Assess response to HEP. Continue core/hip/quad strengthening and conditioning. Consider bike/nustep. Measure leg length discrepancy, address pelvic asymmetry as indicated.  PT Home Exercise Plan Access Code: [email or web address]    Consulted and Agree with Plan of Care Patient           Patient will benefit from skilled therapeutic intervention in order to improve the following deficits and impairments:  Decreased endurance,Decreased mobility,Difficulty walking,Hypomobility,Increased edema,Decreased strength,Postural dysfunction,Pain,Decreased range of motion,Increased fascial restricitons,Impaired flexibility,Decreased balance,Abnormal gait  Visit Diagnosis: Pain in right hip  Stiffness of right hip, not elsewhere classified  Muscle weakness (generalized)  Other abnormalities of gait and mobility     Problem List Patient Active Problem List   Diagnosis Date Noted  . Multiple joint pain 09/26/2016  . Other fatigue  09/26/2016  . Myalgia 09/26/2016  . Abnormal finding on MRI of brain 12/19/2014  . Memory difficulty 12/16/2013  . Fibromyalgia 12/16/2012  . Other nonspecific abnormal result of function study of brain and central nervous system 04/07/2012  . Disturbance of skin sensation 04/07/2012  . Headache(784.0) 04/07/2012    TRUE Garciamartinez April Ma L Lorna Strother PT, DPT 07/04/2020, 11:39 AM  [facility] [place], [place], [address] Phone: [phone]   Fax:  [phone]  Name: TECIA CINNAMON MRN: [id number] Date of Birth: 04-20-1956

## 2020-07-11 ENCOUNTER — Ambulatory Visit (HOSPITAL_BASED_OUTPATIENT_CLINIC_OR_DEPARTMENT_OTHER): Payer: PRIVATE HEALTH INSURANCE | Attending: Orthopaedic Surgery | Admitting: Physical Therapy

## 2020-07-11 ENCOUNTER — Other Ambulatory Visit: Payer: Self-pay

## 2020-07-11 DIAGNOSIS — M7061 Trochanteric bursitis, right hip: Secondary | ICD-10-CM | POA: Insufficient documentation

## 2020-07-11 DIAGNOSIS — R2689 Other abnormalities of gait and mobility: Secondary | ICD-10-CM

## 2020-07-11 DIAGNOSIS — M25551 Pain in right hip: Secondary | ICD-10-CM | POA: Diagnosis not present

## 2020-07-11 DIAGNOSIS — M25651 Stiffness of right hip, not elsewhere classified: Secondary | ICD-10-CM

## 2020-07-11 DIAGNOSIS — M6281 Muscle weakness (generalized): Secondary | ICD-10-CM

## 2020-07-11 NOTE — Therapy (Addendum)
[facility] [address], [place], [address] Phone: [phone]   Fax:  [phone]  Physical Therapy Treatment and Discharge  Patient Details  Name: Amanda Waters MRN: [id number] Date of Birth: September 02, 1955 Referring Provider (PT): Mcarthur Rossetti, MD   PHYSICAL THERAPY DISCHARGE SUMMARY  Visits from Start of Care: 2  Current functional level related to goals / functional outcomes: Pt called to states she is unsure if PT is appropriate for her   Remaining deficits: See below   Education / Equipment: See below  Plan: Patient agrees to discharge.  Patient goals were not met. Patient is being discharged due to lack of progress.  ?????       Encounter Date: 07/11/2020   PT End of Session - 07/11/20 1256    Visit Number 2    Number of Visits 13    Date for PT Re-Evaluation 08/15/20    Authorization Type [employer]    PT Start Time 1020    PT Stop Time 1107    PT Time Calculation (min) 47 min    Activity Tolerance Patient tolerated treatment well    Behavior During Therapy WFL for tasks assessed/performed           Past Medical History:  Diagnosis Date  . Abnormal finding on MRI of brain 12/19/2014  . Adenomatous polyp of colon 2006  . Allergy   . Chronic back pain   . Dyslipidemia   . Fibromyalgia   . GERD (gastroesophageal reflux disease)   . History of carpal tunnel syndrome   . History of hemorrhoids   . HSV-1 (herpes simplex virus 1) infection   . HSV-2 (herpes simplex virus 2) infection   . Hyperlipidemia   . IBS (irritable bowel syndrome)   . 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 Iron deficiency anemia   . Kidney infection   . Kidney stones   . Left radial fracture    Radial head fracture  . Memory difficulty 12/16/2013  . Occipital neuralgia   . Osteopenia   . Sleep apnea   . Swollen lymph nodes     Past Surgical History:  Procedure  Laterality Date  . Broken Elbow Bilateral   . CHOLECYSTECTOMY    . COLONOSCOPY    . CYSTECTOMY    . DILATION AND CURETTAGE OF UTERUS    . hammartoe surgery Left    2nd toe  . POLYPECTOMY    . SHOULDER SURGERY    . TONSILLECTOMY      There were no vitals filed for this visit.   Subjective Assessment - 07/11/20 1022    Subjective Pt states she was sore after evaluation. Pt states she has been more cognizant of her hip imbalances. Pt reports that the 2 floor exercises seem to help.    Pertinent History chronic back pain, dyslipidemia, fibromyalgia, GERD, carpal tunnel, IBS, hyperlipidemia, kidney infection & stones, occipital neuralgia, osteopenia    Limitations Standing;Walking;House hold activities    How long can you sit comfortably? n/a    How long can you stand comfortably? After sitting too long immediate discomfort with standing    How long can you walk comfortably? ~20 minutes    Diagnostic tests XR 05/31/20: An AP pelvis and lateral right hip shows normal-appearing hips bilaterally with no acute findings.    Patient Stated Goals Continue to improve pain    Currently in Pain? Yes    Pain Score 3  Pain Location Hip    Pain Orientation Left                             [facility] Adult PT Treatment/Exercise - 07/11/20 0001      Lumbar Exercises: Stretches   Figure 4 Stretch 30 seconds      Lumbar Exercises: Aerobic   Recumbent Bike Lvl 2 x 5 min      Lumbar Exercises: Supine   Clam 10 reps;3 seconds    Clam Limitations blue tband    Bridge Compliant;10 reps;3 seconds      Lumbar Exercises: Prone   Straight Leg Raise 10 reps      Lumbar Exercises: Quadruped   Straight Leg Raise 10 reps   Attempted but difficult for pt to maintain upper body stability     Knee/Hip Exercises: Standing   Other Standing Knee Exercises Captain Morgan against wall 3x20 sec      Knee/Hip Exercises: Prone   Hip Extension Strengthening;Right;2 sets;10 reps      Manual  Therapy   Manual Therapy Joint mobilization    Joint Mobilization Grade III R hip PA mobilization for anterior joint                  PT Education - 07/11/20 1257    Education Details Discussed current plan for strengthening/AROM prior to attempting increased stretching or manual therapy.    Person(s) Educated Patient    Methods Explanation;Demonstration;Verbal cues    Comprehension Verbalized understanding;Verbal cues required;Tactile cues required            PT Short Term Goals - 07/04/20 1128      PT SHORT TERM GOAL #1   Title Pt will be independent with initial HEP    Baseline Newly provided    Time 3    Period Weeks    Status New    Target Date 07/25/20      PT SHORT TERM GOAL #2   Title Pt will be able to tolerate squatting 10x with 10 lb weight to demo improved functional LE strength    Baseline Fatigue after 3 squats    Time 3    Period Weeks    Status New    Target Date 07/25/20      PT SHORT TERM GOAL #3   Title Pt will report decrease in pain to </=4/10 in the evenings    Baseline Hurts worst later in the day (at worst 6 or 7/10)    Time 3    Period Weeks    Status New    Target Date 07/25/20             PT Long Term Goals - 07/04/20 1130      PT LONG TERM GOAL #1   Title Pt will be able to return to leisure activities (race walking) without pain    Baseline Currently on hold    Time 6    Period Weeks    Status New    Target Date 08/15/20      PT LONG TERM GOAL #2   Title Pt will be able to squat at least 25lbs to demo increased functional strength    Time 6    Period Weeks    Status New    Target Date 08/15/20      PT LONG TERM GOAL #3   Title Pt will be able to perform single leg dead lift x10  on R LE with at least 5 lbs to demo improved R LE hamstring and glute stability    Baseline Currently only able to tolerate 5 sec of Captain Morgans against wall    Time 6    Period Weeks    Status New    Target Date 08/15/20                  Plan - 07/11/20 1038    Clinical Impression Statement Treatment focused on reviewing HEP. Progressed pt's hip strengthening exercises. Discussed gentle strengthening and ROM due to her fibromyalgia and for pt to take breaks as her body needs. Pt was very sore after evaluation -- will need to continue to monitor her exercise response and grade it appropriately. At this time, Isometric hip abduction against wall appears the most challenging for her. Pt is demonstrating improved pelvic symmetry this session.    Personal Factors and Comorbidities Age;Comorbidity 1;Comorbidity 3+;Comorbidity 2;Time since onset of injury/illness/exacerbation    Comorbidities chronic back pain, dyslipidemia, fibromyalgia, GERD, carpal tunnel, IBS, hyperlipidemia, kidney infection & stones, occipital neuralgia, osteopenia    Examination-Activity Limitations Bend;Locomotion Level;Squat;Stand;Stairs;Transfers    Examination-Participation Restrictions Cleaning;Community Activity;Occupation    Stability/Clinical Decision Making Evolving/Moderate complexity    Rehab Potential Good    PT Frequency 2x / week    PT Duration 6 weeks    PT Treatment/Interventions ADLs/Self Care Home Management;Aquatic Therapy;Electrical Stimulation;Moist Heat;Ultrasound;DME Instruction;Gait training;Stair training;Functional mobility training;Therapeutic activities;Neuromuscular re-education;Balance training;Therapeutic exercise;Manual techniques;Patient/family education;Dry needling;Vasopneumatic Device    PT Next Visit Plan Assess response to HEP -- may need to grade down based on her fibromyalgia response. Consider myofacial release as needed for pt pain/comfort. Continue core/hip/quad strengthening and conditioning. Address pelvic asymmetry as indicated.    PT Home Exercise Plan Access Code: [email or web address]    Consulted and Agree with Plan of Care Patient           Patient will benefit from skilled therapeutic intervention in order to  improve the following deficits and impairments:  Decreased endurance,Decreased mobility,Difficulty walking,Hypomobility,Increased edema,Decreased strength,Postural dysfunction,Pain,Decreased range of motion,Increased fascial restricitons,Impaired flexibility,Decreased balance,Abnormal gait  Visit Diagnosis: Pain in right hip  Stiffness of right hip, not elsewhere classified  Muscle weakness (generalized)  Other abnormalities of gait and mobility     Problem List Patient Active Problem List   Diagnosis Date Noted  . Multiple joint pain 09/26/2016  . Other fatigue 09/26/2016  . Myalgia 09/26/2016  . Abnormal finding on MRI of brain 12/19/2014  . Memory difficulty 12/16/2013  . Fibromyalgia 12/16/2012  . Other nonspecific abnormal result of function study of brain and central nervous system 04/07/2012  . Disturbance of skin sensation 04/07/2012  . Headache(784.0) 04/07/2012    Bertin Inabinet April Ma L Lovey Crupi PT, DPT 07/11/2020, 1:01 PM  [facility] [place], [place], [address] Phone: [phone]   Fax:  [phone]  Name: Amanda Waters MRN: [id number] Date of Birth: [birth year]

## 2020-07-12 ENCOUNTER — Ambulatory Visit: Payer: PRIVATE HEALTH INSURANCE | Admitting: Orthopaedic Surgery

## 2020-07-13 ENCOUNTER — Ambulatory Visit (HOSPITAL_BASED_OUTPATIENT_CLINIC_OR_DEPARTMENT_OTHER): Payer: PRIVATE HEALTH INSURANCE | Admitting: Physical Therapy

## 2020-07-17 ENCOUNTER — Ambulatory Visit (HOSPITAL_BASED_OUTPATIENT_CLINIC_OR_DEPARTMENT_OTHER): Payer: PRIVATE HEALTH INSURANCE | Admitting: Physical Therapy

## 2020-07-20 ENCOUNTER — Ambulatory Visit (HOSPITAL_BASED_OUTPATIENT_CLINIC_OR_DEPARTMENT_OTHER): Payer: PRIVATE HEALTH INSURANCE | Admitting: Physical Therapy

## 2020-07-25 ENCOUNTER — Ambulatory Visit (HOSPITAL_BASED_OUTPATIENT_CLINIC_OR_DEPARTMENT_OTHER): Payer: PRIVATE HEALTH INSURANCE | Admitting: Physical Therapy

## 2020-07-27 ENCOUNTER — Ambulatory Visit (HOSPITAL_BASED_OUTPATIENT_CLINIC_OR_DEPARTMENT_OTHER): Payer: PRIVATE HEALTH INSURANCE | Admitting: Physical Therapy

## 2020-08-01 ENCOUNTER — Encounter (HOSPITAL_BASED_OUTPATIENT_CLINIC_OR_DEPARTMENT_OTHER): Payer: PRIVATE HEALTH INSURANCE | Admitting: Physical Therapy

## 2020-08-03 ENCOUNTER — Encounter (HOSPITAL_BASED_OUTPATIENT_CLINIC_OR_DEPARTMENT_OTHER): Payer: PRIVATE HEALTH INSURANCE | Admitting: Physical Therapy

## 2020-08-07 ENCOUNTER — Encounter (HOSPITAL_BASED_OUTPATIENT_CLINIC_OR_DEPARTMENT_OTHER): Payer: PRIVATE HEALTH INSURANCE | Admitting: Physical Therapy

## 2020-08-08 ENCOUNTER — Ambulatory Visit: Payer: PRIVATE HEALTH INSURANCE | Admitting: Orthopaedic Surgery

## 2020-08-10 ENCOUNTER — Encounter (HOSPITAL_BASED_OUTPATIENT_CLINIC_OR_DEPARTMENT_OTHER): Payer: PRIVATE HEALTH INSURANCE | Admitting: Physical Therapy

## 2020-08-15 ENCOUNTER — Encounter (HOSPITAL_BASED_OUTPATIENT_CLINIC_OR_DEPARTMENT_OTHER): Payer: PRIVATE HEALTH INSURANCE | Admitting: Physical Therapy

## 2020-08-17 ENCOUNTER — Encounter (HOSPITAL_BASED_OUTPATIENT_CLINIC_OR_DEPARTMENT_OTHER): Payer: PRIVATE HEALTH INSURANCE | Admitting: Physical Therapy

## 2020-08-22 ENCOUNTER — Ambulatory Visit: Payer: PRIVATE HEALTH INSURANCE | Admitting: Orthopaedic Surgery

## 2020-09-12 ENCOUNTER — Ambulatory Visit (INDEPENDENT_AMBULATORY_CARE_PROVIDER_SITE_OTHER): Payer: PRIVATE HEALTH INSURANCE | Admitting: Neurology

## 2020-09-12 ENCOUNTER — Encounter: Payer: Self-pay | Admitting: Neurology

## 2020-09-12 ENCOUNTER — Other Ambulatory Visit: Payer: Self-pay

## 2020-09-12 VITALS — BP 134/73 | HR 77 | Ht 65.0 in | Wt 140.8 lb

## 2020-09-12 DIAGNOSIS — R202 Paresthesia of skin: Secondary | ICD-10-CM | POA: Diagnosis not present

## 2020-09-12 DIAGNOSIS — Z5181 Encounter for therapeutic drug level monitoring: Secondary | ICD-10-CM

## 2020-09-12 MED ORDER — DIVALPROEX SODIUM 250 MG PO DR TAB: 250.0000 mg | DELAYED_RELEASE_TABLET | Freq: Two times a day (BID) | ORAL | 2 refills | Status: DC

## 2020-09-12 NOTE — Patient Instructions (Signed)
We will start Depakote for the jaw jerking and headache.  Depakote (valproic acid) is a seizure medication that also has an FDA approval for migraine headache. The most common potential side effects of this medication include weight gain, tremor, or possible stomach upset. This medication can potentially cause liver problems. If confusion is noted on this medication, contact our office immediately.

## 2020-09-12 NOTE — Progress Notes (Signed)
Reason for visit: Fibromyalgia, paresthesias, headache  Amanda Waters is an 65 y.o. female  History of present illness:  Amanda Waters is a 65 year old right-handed white female with a history of fibromyalgia.  The patient reports some mild cognitive impairment, this has not changed over time.  The patient has had ongoing daily headaches but the headaches are less severe.  She may get a slight headache at some point during the day and take Tylenol for this.  She remains on gabapentin taking 600 mg 3 times daily.  She was given a prescription for nortriptyline but never took the medication.  She has noted over the last several months that she has had increased problems with tingling in the hands and feet, she has had this to some degree for at least 2 years.  The patient also has noted unusual jerking of the jaw with closure that may occur several times a day.  This began about a month ago.  She returns to the office today for further evaluation.  Past Medical History:  Diagnosis Date  . Abnormal finding on MRI of brain 12/19/2014  . Adenomatous polyp of colon 2006  . Allergy   . Chronic back pain   . Dyslipidemia   . Fibromyalgia   . GERD (gastroesophageal reflux disease)   . History of carpal tunnel syndrome   . History of hemorrhoids   . HSV-1 (herpes simplex virus 1) infection   . HSV-2 (herpes simplex virus 2) infection   . Hyperlipidemia   . IBS (irritable bowel syndrome)   . 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 Iron deficiency anemia   . Kidney infection   . Kidney stones   . Left radial fracture    Radial head fracture  . Memory difficulty 12/16/2013  . Occipital neuralgia   . Osteopenia   . Sleep apnea   . Swollen lymph nodes     Past Surgical History:  Procedure Laterality Date  . Broken Elbow Bilateral   . CHOLECYSTECTOMY    . COLONOSCOPY    . CYSTECTOMY    . DILATION AND CURETTAGE OF UTERUS    . hammartoe surgery Left     2nd toe  . POLYPECTOMY    . SHOULDER SURGERY    . TONSILLECTOMY      Family History  Problem Relation Age of Onset  . Fibromyalgia Mother   . Colon polyps Mother   . Diabetes Father   . Thyroid cancer Father   . Heart disease Father   . Dementia Father   . Colon polyps Father   . Colon cancer Neg Hx   . Esophageal cancer Neg Hx   . Liver cancer Neg Hx   . Pancreatic cancer Neg Hx   . Stomach cancer Neg Hx   . Rectal cancer Neg Hx     Social history:  reports that she has never smoked. She has never used smokeless tobacco. She reports current alcohol use. She reports that she does not use drugs.    Allergies  Allergen Reactions  . Celebrex [Celecoxib]   . Darvon [Propoxyphene]   . Duract [Bromfenac]   . Nitrofuran Derivatives     Headache   . Oxycodone-Acetaminophen   . Prednisone   . Sulfa Antibiotics     Medications:  Prior to Admission medications   Medication Sig Start Date End Date Taking? Authorizing Provider  b complex vitamins tablet Take 1 tablet by mouth daily.  Yes [provider]  Calcium Carbonate-Vitamin D 600-400 MG-UNIT chew tablet Chew 1 tablet by mouth daily.   Yes [provider]  cyclobenzaprine (FLEXERIL) 10 MG tablet Take 10 mg by mouth 3 (three) times daily as needed.   Yes [provider]  diclofenac (FLECTOR) 1.3 % PTCH Place 1 patch onto the skin daily as needed.   Yes [provider]  famciclovir (FAMVIR) 500 MG tablet Take 500 mg by mouth daily.   Yes [provider]  famotidine (PEPCID) 20 MG tablet Take 20 mg by mouth at bedtime.   Yes [provider]  gabapentin (NEURONTIN) 600 MG tablet TAKE 1 TABLET BY MOUTH THREE TIMES A DAY 04/10/20  Yes Kathrynn Ducking, MD  HYDROcodone-acetaminophen (NORCO/VICODIN) 5-325 MG per tablet Take 1 tablet by mouth every 6 (six) hours as needed for pain.   Yes [provider]  IMVEXXY MAINTENANCE PACK 10 MCG INST Place 1 capsule vaginally 2  (two) times a week. 06/11/19  Yes [provider]  loratadine (CLARITIN) 10 MG tablet Take 10 mg by mouth daily as needed.    Yes [provider]  nortriptyline (PAMELOR) 10 MG capsule TAKE 3 CAPSULES AT NIGHT 04/12/20  Yes Kathrynn Ducking, MD  Nutritional Supplements (DHEA PO) Take 5 mg by mouth daily.   Yes [provider]  pantoprazole (PROTONIX) 40 MG tablet TAKE 1 TABLET BY MOUTH EVERY DAY 06/21/20  Yes Levin Erp, PA  vitamin E 400 UNIT capsule Take 400 Units by mouth daily.   Yes [provider]  zolpidem (AMBIEN) 5 MG tablet Take 1 tablet (5 mg total) by mouth at bedtime as needed. for sleep 03/14/20  Yes Kathrynn Ducking, MD    ROS:  Out of a complete 14 system review of symptoms, the patient complains only of the following symptoms, and all other reviewed systems are negative.  Memory problems Jaw jerking Tingling  Blood pressure 134/73, pulse 77, height 5\' 5"  (1.651 m), weight 140 lb 12.8 oz (63.9 kg).  Physical Exam  General: The patient is alert and cooperative at the time of the examination.  Skin: No significant peripheral edema is noted.   Neurologic Exam  Mental status: The patient is alert and oriented x 3 at the time of the examination. The patient has apparent normal recent and remote memory, with an apparently normal attention span and concentration ability.  Mini-Mental status examination done today shows a total score 30/30.   Cranial nerves: Facial symmetry is present. Speech is normal, no aphasia or dysarthria is noted. Extraocular movements are full. Visual fields are full.  Motor: The patient has good strength in all 4 extremities.  Sensory examination: Soft touch sensation is symmetric on the face, arms, and legs.  Coordination: The patient has good finger-nose-finger and heel-to-shin bilaterally.  Gait and station: The patient has a normal gait. Tandem gait is normal. Romberg is negative. No drift is  seen.  Reflexes: Deep tendon reflexes are symmetric.   Assessment/Plan:  1.  Fibromyalgia  2.  Paresthesias, all 4 extremities  3.  Mild cognitive impairment  4.  Jaw myoclonus  The patient is having tingling on all 4 extremities, she does not wish to have EMG or nerve conduction study evaluation.  We will do blood work today.  We will give her a trial on low-dose Depakote for her jaw jerking and for her headache issue.  She will continue the gabapentin.  She will follow up here in  6 months.  Jill Alexanders MD 09/12/2020 10:21 AM  [facility] [address] [place], [place] [address]  Phone [phone] Fax [phone]

## 2020-09-14 ENCOUNTER — Encounter: Payer: Self-pay | Admitting: Orthopaedic Surgery

## 2020-09-14 ENCOUNTER — Ambulatory Visit (INDEPENDENT_AMBULATORY_CARE_PROVIDER_SITE_OTHER): Payer: PRIVATE HEALTH INSURANCE | Admitting: Orthopaedic Surgery

## 2020-09-14 DIAGNOSIS — M7061 Trochanteric bursitis, right hip: Secondary | ICD-10-CM | POA: Diagnosis not present

## 2020-09-14 MED ORDER — LIDOCAINE HCL 1 % IJ SOLN
3.0000 mL | INTRAMUSCULAR | Status: AC | PRN
Start: 2020-09-14 — End: 2020-09-14
  Administered 2020-09-14: 3 mL

## 2020-09-14 MED ORDER — METHYLPREDNISOLONE ACETATE 40 MG/ML IJ SUSP
40.0000 mg | INTRAMUSCULAR | Status: AC | PRN
  Administered 2020-09-14: 40 mg via INTRA_ARTICULAR

## 2020-09-14 NOTE — Progress Notes (Signed)
Office Visit Note   Patient: Amanda Waters           Date of Birth: [birth year]           MRN: [id number] Visit Date: 09/14/2020              Requested by: Bernerd Limbo, MD [place],  [place] [id number] PCP: Bernerd Limbo, MD   Assessment & Plan: Visit Diagnoses:  1. Trochanteric bursitis, right hip     Plan: I agree with the patient to try 1 more injection over the trochanteric area with a steroid is worthwhile.  She tolerated well.  All question concerns were answered and addressed.  At this point follow-up can be as needed.  We can always repeat this injection late summer if needed.  Follow-Up Instructions: Return if symptoms worsen or fail to improve.   Orders:  Orders Placed This Encounter  Procedures  . Large Joint Inj   No orders of the defined types were placed in this encounter.     Procedures: Large Joint Inj: R greater trochanter on 09/14/2020 1:31 PM Indications: pain and diagnostic evaluation Details: 22 G 1.5 in needle, lateral approach  Arthrogram: No  Medications: 3 mL lidocaine 1 %; 40 mg methylPREDNISolone acetate 40 MG/ML Outcome: tolerated well, no immediate complications Procedure, treatment alternatives, risks and benefits explained, specific risks discussed. Consent was given by the patient. Immediately prior to procedure a time out was called to verify the correct patient, procedure, equipment, support staff and site/side marked as required. Patient was prepped and draped in the usual sterile fashion.       Clinical Data: No additional findings.   Subjective: Chief Complaint  Patient presents with  . Right Hip - Follow-up  The patient is being seen in follow-up for right hip trochanteric bursitis.  I did place a steroid injection around the right hip trochanteric area back in January.  She is now been doing physical therapy.  She still has problems sleeping on that hip at night but feels significantly better  overall.  She does see a therapist for massage in the fascia.  She is interested in at least trying 1 more injection to see if this will continue to help get this to go away completely.  She said no other acute change in her medical status.  She still denies any groin pain at all.  HPI  Review of Systems There is currently listed no headache, chest pain, shortness of breath, fever, chills, nausea, vomiting  Objective: Vital Signs: There were no vitals taken for this visit.  Physical Exam She is alert and orient x3 and in no acute distress Ortho Exam Her right hip moves smoothly and fluidly.  There is pain over the trochanteric area and the proximal IT band on the right hip. Specialty Comments:  No specialty comments available.  Imaging: No results found.   PMFS History: Patient Active Problem List   Diagnosis Date Noted  . Multiple joint pain 09/26/2016  . Other fatigue 09/26/2016  . Myalgia 09/26/2016  . Abnormal finding on MRI of brain 12/19/2014  . Memory difficulty 12/16/2013  . Fibromyalgia 12/16/2012  . Other nonspecific abnormal result of function study of brain and central nervous system 04/07/2012  . Disturbance of skin sensation 04/07/2012  . Headache(784.0) 04/07/2012   Past Medical History:  Diagnosis Date  . Abnormal finding on MRI of brain 12/19/2014  . Adenomatous polyp of colon 2006  . Allergy   .  Chronic back pain   . Dyslipidemia   . Fibromyalgia   . GERD (gastroesophageal reflux disease)   . History of carpal tunnel syndrome   . History of hemorrhoids   . HSV-1 (herpes simplex virus 1) infection   . HSV-2 (herpes simplex virus 2) infection   . Hyperlipidemia   . IBS (irritable bowel syndrome)   . 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 Iron deficiency anemia   . Kidney infection   . Kidney stones   . Left radial fracture    Radial head fracture  . Memory difficulty 12/16/2013  . Occipital neuralgia   .  Osteopenia   . Sleep apnea   . Swollen lymph nodes     Family History  Problem Relation Age of Onset  . Fibromyalgia Mother   . Colon polyps Mother   . Diabetes Father   . Thyroid cancer Father   . Heart disease Father   . Dementia Father   . Colon polyps Father   . Colon cancer Neg Hx   . Esophageal cancer Neg Hx   . Liver cancer Neg Hx   . Pancreatic cancer Neg Hx   . Stomach cancer Neg Hx   . Rectal cancer Neg Hx     Past Surgical History:  Procedure Laterality Date  . Broken Elbow Bilateral   . CHOLECYSTECTOMY    . COLONOSCOPY    . CYSTECTOMY    . DILATION AND CURETTAGE OF UTERUS    . hammartoe surgery Left    2nd toe  . POLYPECTOMY    . SHOULDER SURGERY    . TONSILLECTOMY     Social History   Occupational History  . Occupation: Futures trader    Comment: part time  Tobacco Use  . Smoking status: Never Smoker  . Smokeless tobacco: Never Used  Substance and Sexual Activity  . Alcohol use: Yes    Comment: Consumes alcohol on occasion  . Drug use: No  . Sexual activity: Not on file

## 2020-09-16 LAB — COMPREHENSIVE METABOLIC PANEL
ALT: 41 IU/L — ABNORMAL HIGH (ref 0–32)
AST: 37 IU/L (ref 0–40)
Albumin/Globulin Ratio: 2.3 — ABNORMAL HIGH (ref 1.2–2.2)
Albumin: 4.8 g/dL (ref 3.8–4.8)
Alkaline Phosphatase: 50 IU/L (ref 44–121)
BUN/Creatinine Ratio: 10 — ABNORMAL LOW (ref 12–28)
BUN: 11 mg/dL (ref 8–27)
Bilirubin Total: 0.6 mg/dL (ref 0.0–1.2)
CO2: 24 mmol/L (ref 20–29)
Calcium: 9.6 mg/dL (ref 8.7–10.3)
Chloride: 107 mmol/L — ABNORMAL HIGH (ref 96–106)
Creatinine, Ser: 1.12 mg/dL — ABNORMAL HIGH (ref 0.57–1.00)
Globulin, Total: 2.1 g/dL (ref 1.5–4.5)
Glucose: 97 mg/dL (ref 65–99)
Potassium: 4.8 mmol/L (ref 3.5–5.2)
Sodium: 144 mmol/L (ref 134–144)
Total Protein: 6.9 g/dL (ref 6.0–8.5)
eGFR: 56 mL/min/{1.73_m2} — ABNORMAL LOW (ref >59)

## 2020-09-16 LAB — MULTIPLE MYELOMA PANEL, SERUM
Albumin SerPl Elph-Mcnc: 4.3 g/dL (ref 2.9–4.4)
Albumin/Glob SerPl: 1.7 (ref 0.7–1.7)
Alpha 1: 0.2 g/dL (ref 0.0–0.4)
Alpha2 Glob SerPl Elph-Mcnc: 0.8 g/dL (ref 0.4–1.0)
B-Globulin SerPl Elph-Mcnc: 1 g/dL (ref 0.7–1.3)
Gamma Glob SerPl Elph-Mcnc: 0.7 g/dL (ref 0.4–1.8)
Globulin, Total: 2.6 g/dL (ref 2.2–3.9)
IgA/Immunoglobulin A, Serum: 37 mg/dL — ABNORMAL LOW (ref 87–352)
IgG (Immunoglobin G), Serum: 783 mg/dL (ref 586–1602)
IgM (Immunoglobulin M), Srm: 56 mg/dL (ref 26–217)

## 2020-09-16 LAB — LYME DISEASE SEROLOGY W/REFLEX: Lyme Total Antibody EIA: NEGATIVE

## 2020-09-16 LAB — SEDIMENTATION RATE: Sed Rate: 2 mm/hr (ref 0–40)

## 2020-09-16 LAB — VITAMIN B12: Vitamin B-12: 793 pg/mL (ref 232–1245)

## 2020-09-16 LAB — COPPER, SERUM: Copper: 93 ug/dL (ref 80–158)

## 2020-09-16 LAB — ANA W/REFLEX: Anti Nuclear Antibody (ANA): NEGATIVE

## 2020-09-16 LAB — HEPATITIS C ANTIBODY: Hep C Virus Ab: <0.1 s/co ratio (ref 0.0–0.9)

## 2020-09-16 LAB — ANGIOTENSIN CONVERTING ENZYME: Angio Convert Enzyme: 60 U/L (ref 14–82)

## 2020-09-21 ENCOUNTER — Other Ambulatory Visit: Payer: Self-pay | Admitting: Physician Assistant

## 2020-09-21 ENCOUNTER — Other Ambulatory Visit: Payer: Self-pay

## 2020-09-21 MED ORDER — PANTOPRAZOLE SODIUM 40 MG PO TBEC: 1.0000 | DELAYED_RELEASE_TABLET | Freq: Every day | ORAL | 0 refills | Status: DC

## 2020-09-22 ENCOUNTER — Telehealth: Payer: Self-pay | Admitting: Gastroenterology

## 2020-09-22 MED ORDER — PANTOPRAZOLE SODIUM 40 MG PO TBEC: 1.0000 | DELAYED_RELEASE_TABLET | Freq: Every day | ORAL | 0 refills | Status: DC

## 2020-09-22 NOTE — Telephone Encounter (Signed)
Inbound call from patient. Medication refill for Pantoprazole sent to [facility] in [facility].

## 2020-09-22 NOTE — Telephone Encounter (Signed)
Prescription sent to patient's pharmacy until scheduled appt. 

## 2020-10-09 ENCOUNTER — Other Ambulatory Visit: Payer: Self-pay | Admitting: Neurology

## 2020-10-18 ENCOUNTER — Encounter: Payer: Self-pay | Admitting: Physician Assistant

## 2020-10-18 ENCOUNTER — Other Ambulatory Visit (INDEPENDENT_AMBULATORY_CARE_PROVIDER_SITE_OTHER): Payer: PRIVATE HEALTH INSURANCE

## 2020-10-18 ENCOUNTER — Ambulatory Visit (INDEPENDENT_AMBULATORY_CARE_PROVIDER_SITE_OTHER): Payer: PRIVATE HEALTH INSURANCE | Admitting: Physician Assistant

## 2020-10-18 VITALS — BP 128/80 | HR 102 | Ht 65.0 in | Wt 140.4 lb

## 2020-10-18 DIAGNOSIS — D649 Anemia, unspecified: Secondary | ICD-10-CM | POA: Diagnosis not present

## 2020-10-18 DIAGNOSIS — K219 Gastro-esophageal reflux disease without esophagitis: Secondary | ICD-10-CM

## 2020-10-18 DIAGNOSIS — R1031 Right lower quadrant pain: Secondary | ICD-10-CM

## 2020-10-18 DIAGNOSIS — R110 Nausea: Secondary | ICD-10-CM

## 2020-10-18 LAB — CBC WITH DIFFERENTIAL/PLATELET
Basophils Absolute: 0 10*3/uL (ref 0.0–0.1)
Basophils Relative: 0.4 % (ref 0.0–3.0)
Eosinophils Absolute: 0.1 10*3/uL (ref 0.0–0.7)
Eosinophils Relative: 1.2 % (ref 0.0–5.0)
HCT: 36.3 % (ref 36.0–46.0)
Hemoglobin: 11.8 g/dL — ABNORMAL LOW (ref 12.0–15.0)
Lymphocytes Relative: 29.6 % (ref 12.0–46.0)
Lymphs Abs: 1.7 10*3/uL (ref 0.7–4.0)
MCHC: 32.4 g/dL (ref 30.0–36.0)
MCV: 82.4 fl (ref 78.0–100.0)
Monocytes Absolute: 0.6 10*3/uL (ref 0.1–1.0)
Monocytes Relative: 10.2 % (ref 3.0–12.0)
Neutro Abs: 3.3 10*3/uL (ref 1.4–7.7)
Neutrophils Relative %: 58.6 % (ref 43.0–77.0)
Platelets: 337 10*3/uL (ref 150.0–400.0)
RBC: 4.41 Mil/uL (ref 3.87–5.11)
RDW: 14.3 % (ref 11.5–15.5)
WBC: 5.6 10*3/uL (ref 4.0–10.5)

## 2020-10-18 LAB — COMPREHENSIVE METABOLIC PANEL
ALT: 17 U/L (ref 0–35)
AST: 17 U/L (ref 0–37)
Albumin: 4.5 g/dL (ref 3.5–5.2)
Alkaline Phosphatase: 41 U/L (ref 39–117)
BUN: 14 mg/dL (ref 6–23)
CO2: 28 mEq/L (ref 19–32)
Calcium: 10 mg/dL (ref 8.4–10.5)
Chloride: 104 mEq/L (ref 96–112)
Creatinine, Ser: 1 mg/dL (ref 0.40–1.20)
GFR: 60.33 mL/min (ref >60.00)
Glucose, Bld: 97 mg/dL (ref 70–99)
Potassium: 4.6 mEq/L (ref 3.5–5.1)
Sodium: 141 mEq/L (ref 135–145)
Total Bilirubin: 1.1 mg/dL (ref 0.2–1.2)
Total Protein: 6.8 g/dL (ref 6.0–8.3)

## 2020-10-18 LAB — IBC PANEL
Iron: 107 ug/dL (ref 42–145)
Saturation Ratios: 25.6 % (ref 20.0–50.0)
Transferrin: 298 mg/dL (ref 212.0–360.0)

## 2020-10-18 LAB — FERRITIN: Ferritin: 87.6 ng/mL (ref 10.0–291.0)

## 2020-10-18 LAB — VITAMIN B12: Vitamin B-12: 458 pg/mL (ref 211–911)

## 2020-10-18 MED ORDER — PANTOPRAZOLE SODIUM 40 MG PO TBEC: 1.0000 | DELAYED_RELEASE_TABLET | Freq: Every day | ORAL | 0 refills | Status: DC

## 2020-10-18 NOTE — Patient Instructions (Signed)
If you are age 63 or older, your body mass index should be between 23-30. Your Body mass index is 23.36 kg/m. If this is out of the aforementioned range listed, please consider follow up with your Primary Care Provider.  If you are age 71 or younger, your body mass index should be between 19-25. Your Body mass index is 23.36 kg/m. If this is out of the aformentioned range listed, please consider follow up with your Primary Care Provider.   Your provider has requested that you go to the basement level for lab work before leaving today. Press "B" on the elevator. The lab is located at the first door on the left as you exit the elevator.  You will be contacted by [facility] in the next 2 days to arrange a CT abdomin and Pelvis .  The number on your caller ID will be [phone], please answer when they call.  If you have not heard from them in 2 days please call [phone] to schedule.     The [facility] providers would like to encourage you to use [facility] to communicate with providers for non-urgent requests or questions.  Due to long hold times on the telephone, sending your provider a message by [facility] may be a faster and more efficient way to get a response.  Please allow 48 business hours for a response.  Please remember that this is for non-urgent requests.   It was a pleasure to see you today!  Thank you for trusting me with your gastrointestinal care!    Ellouise Newer, PA-C

## 2020-10-18 NOTE — Progress Notes (Signed)
Chief Complaint: GERD, right lower quadrant pain, anemia  HPI:    Amanda Waters is a 65 year old Caucasian female with a past medical history as listed below including fibromyalgia and reflux, known to Dr. Fuller Plan, who presents to clinic today for a medication refill but also has various new complaints including anemia and a right lower quadrant pain.    11/13/2017 colonoscopy with 3 6-7 mm polyps in descending and transverse colon as well as hepatic flexure, mild diverticulosis in left colon otherwise normal.  Pathology showed sessile serrated polyp without dysplasia.  Repeat recommended in 5 years.     10/24/2017 EGD with normal esophagus, small hiatal hernia, multiple gastric polyps and nonbleeding erosive gastropathy.  Normal duodenal bulb and second portion of duodenum.    Today, patient explains that at the beginning of May she was diagnosed with a UTI and put on Ciprofloxacin, she was on this for about a week and stopped it, but when she stopped it she had continued symptoms of right lower quadrant and right groin pain.  She had follow-up testing with a urine culture by her PCP and was told that she did not have a UTI anymore.  Due to this they did further labs including CBC on 5/27 with a hemoglobin of 10.9.  A year ago this had been normal.  Explains that she then continued with some right lower quadrant pain over the weekend which was so severe that she thought she may need to go to the ER.  She called on-call physician and explained the pain had gotten some better, they told her if she continued with right lower quadrant pain then she may need to go to the ER.  Patient tells me that it then started to resolve over the weekend and she feels fairly well now.  Does continue with some nausea but had no episodes of vomiting.  Patient is very concerned because nobody knows exactly what happened to cause the symptoms.  Currently she does continue with a mild amount of right lower quadrant discomfort.    Also  asked today for her Pantoprazole to be refilled.  As long as she takes this 40 mg once daily she has no reflux symptoms.    Denies fever, chills, weight loss or blood in her stool.  Past Medical History:  Diagnosis Date  . Abnormal finding on MRI of brain 12/19/2014  . Adenomatous polyp of colon 2006  . Allergy   . Chronic back pain   . Dyslipidemia   . Fibromyalgia   . GERD (gastroesophageal reflux disease)   . History of carpal tunnel syndrome   . History of hemorrhoids   . HSV-1 (herpes simplex virus 1) infection   . HSV-2 (herpes simplex virus 2) infection   . Hyperlipidemia   . IBS (irritable bowel syndrome)   . 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 Iron deficiency anemia   . Kidney infection   . Kidney stones   . Left radial fracture    Radial head fracture  . Memory difficulty 12/16/2013  . Occipital neuralgia   . Osteopenia   . Sleep apnea   . Swollen lymph nodes     Past Surgical History:  Procedure Laterality Date  . Broken Elbow Bilateral   . CHOLECYSTECTOMY    . COLONOSCOPY    . CYSTECTOMY    . DILATION AND CURETTAGE OF UTERUS    . hammartoe surgery Left    2nd toe  . POLYPECTOMY    .  SHOULDER SURGERY    . TONSILLECTOMY      Current Outpatient Medications  Medication Sig Dispense Refill  . b complex vitamins tablet Take 1 tablet by mouth daily.    . Calcium Carbonate-Vitamin D 600-400 MG-UNIT chew tablet Chew 1 tablet by mouth daily.    . cyclobenzaprine (FLEXERIL) 10 MG tablet Take 10 mg by mouth 3 (three) times daily as needed.    . diclofenac (FLECTOR) 1.3 % PTCH Place 1 patch onto the skin daily as needed.    . famciclovir (FAMVIR) 500 MG tablet Take 500 mg by mouth daily.    . famotidine (PEPCID) 20 MG tablet Take 20 mg by mouth at bedtime.    . gabapentin (NEURONTIN) 600 MG tablet TAKE 1 TABLET BY MOUTH THREE TIMES A DAY 270 tablet 3  . HYDROcodone-acetaminophen (NORCO/VICODIN) 5-325 MG per tablet Take 1  tablet by mouth every 6 (six) hours as needed for pain.    Harlow Mares MAINTENANCE PACK 10 MCG INST Place 1 capsule vaginally 2 (two) times a week.    . loratadine (CLARITIN) 10 MG tablet Take 10 mg by mouth daily as needed.     . Nutritional Supplements (DHEA PO) Take 5 mg by mouth daily.    . pantoprazole (PROTONIX) 40 MG tablet Take 1 tablet (40 mg total) by mouth daily. 90 tablet 0  . vitamin E 400 UNIT capsule Take 400 Units by mouth daily.    Marland Kitchen zolpidem (AMBIEN) 5 MG tablet TAKE 1 TABLET (5 MG TOTAL) BY MOUTH AT BEDTIME AS NEEDED. FOR SLEEP 30 tablet 3   No current facility-administered medications for this visit.    Allergies as of 10/18/2020 - Review Complete 10/18/2020  Allergen Reaction Noted  . Celebrex [celecoxib]  11/24/2011  . Darvon [propoxyphene]  11/24/2011  . Duract [bromfenac]  11/24/2011  . Nitrofuran derivatives  05/16/2017  . Oxycodone-acetaminophen  11/11/2013  . Prednisone  11/24/2011  . Sulfa antibiotics  12/19/2014    Family History  Problem Relation Age of Onset  . Fibromyalgia Mother   . Colon polyps Mother   . Diabetes Father   . Thyroid cancer Father   . Heart disease Father   . Dementia Father   . Colon polyps Father   . Colon cancer Neg Hx   . Esophageal cancer Neg Hx   . Liver cancer Neg Hx   . Pancreatic cancer Neg Hx   . Stomach cancer Neg Hx   . Rectal cancer Neg Hx     Social History   Socioeconomic History  . Marital status: Married    Spouse name: Not on file  . Number of children: 0  . Years of education: 33  . Highest education level: Not on file  Occupational History  . Occupation: Futures trader    Comment: part time  Tobacco Use  . Smoking status: Never Smoker  . Smokeless tobacco: Never Used  Substance and Sexual Activity  . Alcohol use: Yes    Comment: Consumes alcohol on occasion  . Drug use: No  . Sexual activity: Not on file  Other Topics Concern  . Not on file  Social History Narrative   Lives at home w/  her husband and dog   Patient drinks 1 cup of tea a day.    Patient is right handed.   Social Determinants of Health   Financial Resource Strain: Not on file  Food Insecurity: Not on file  Transportation Needs: Not on file  Physical Activity: Not  on file  Stress: Not on file  Social Connections: Not on file  Intimate Partner Violence: Not on file    Review of Systems:    Constitutional: No weight loss, fever or chills Cardiovascular: No chest pain Respiratory: No SOB Gastrointestinal: See HPI and otherwise negative   Physical Exam:  Vital signs: BP 128/80   Pulse (!) 102   Ht 5\' 5"  (1.651 m)   Wt 140 lb 6.4 oz (63.7 kg)   SpO2 99%   BMI 23.36 kg/m   Constitutional:   Pleasant Caucasian female appears to be in NAD, Well developed, Well nourished, alert and cooperative Respiratory: Respirations even and unlabored. Lungs clear to auscultation bilaterally.   No wheezes, crackles, or rhonchi.  Cardiovascular: Normal S1, S2. No MRG. Regular rate and rhythm. No peripheral edema, cyanosis or pallor.  Gastrointestinal:  Soft, nondistended, moderate right lower quadrant TTP with some involuntary guarding. Normal bowel sounds. No appreciable masses or hepatomegaly. Rectal:  Not performed.  Psychiatric: Demonstrates good judgement and reason without abnormal affect or behaviors.  RELEVANT LABS AND IMAGING: CBC    Component Value Date/Time   WBC 6.8 05/24/2019 1601   RBC 4.69 05/24/2019 1601   HGB 12.7 05/24/2019 1601   HCT 37.9 05/24/2019 1601   PLT 311.0 05/24/2019 1601   MCV 80.8 05/24/2019 1601   MCH 25.9 (L) 05/16/2017 0946   MCHC 33.4 05/24/2019 1601   RDW 15.1 05/24/2019 1601   LYMPHSABS 2.1 05/24/2019 1601   MONOABS 0.7 05/24/2019 1601   EOSABS 0.1 05/24/2019 1601   BASOSABS 0.0 05/24/2019 1601    CMP     Component Value Date/Time   NA 144 09/12/2020 1051   K 4.8 09/12/2020 1051   CL 107 (H) 09/12/2020 1051   CO2 24 09/12/2020 1051   GLUCOSE 97 09/12/2020  1051   GLUCOSE 102 (H) 05/24/2019 1601   BUN 11 09/12/2020 1051   CREATININE 1.12 (H) 09/12/2020 1051   CALCIUM 9.6 09/12/2020 1051   PROT 6.9 09/12/2020 1051   ALBUMIN 4.8 09/12/2020 1051   AST 37 09/12/2020 1051   ALT 41 (H) 09/12/2020 1051   ALKPHOS 50 09/12/2020 1051   BILITOT 0.6 09/12/2020 1051   GFRNONAA >60 05/16/2017 0946   GFRAA >60 05/16/2017 0946    Assessment: 1.  Anemia: New drop in hemoglobin from 12.7 in January of last year to 10.9 on 5/27, patient does have minimal red meat in her diet over the past 2 years, but no overt bleeding; consider GI source versus other 2.  GERD: Controlled on Pantoprazole 40 mg daily 3.  Right lower quadrant pain: With nausea worse over the past few days; consider inflammation versus fibromyalgia versus other  Plan: 1.  Ordered labs to include repeat CBC, iron studies and B12 2.  Ordered Hemoccult testing x3 3.  Refilled Pantoprazole 40 mg daily #90 with 3 refills. 4.  Ordered a non-contrast CT of the abdomen pelvis for further evaluation 5.  Discussed with patient that pending CBC/Hemoccult testing and CT may need to consider repeat EGD and colonoscopy for further evaluation as it has been 3 years since these were done. 6.  Patient to follow in clinic per recommendations after testing above.  Ellouise Newer, PA-C [facility] 10/18/2020, 10:47 AM  Cc: Bernerd Limbo, MD

## 2020-10-19 ENCOUNTER — Other Ambulatory Visit: Payer: Self-pay

## 2020-10-19 DIAGNOSIS — R1031 Right lower quadrant pain: Secondary | ICD-10-CM

## 2020-10-19 DIAGNOSIS — R110 Nausea: Secondary | ICD-10-CM

## 2020-10-19 DIAGNOSIS — D649 Anemia, unspecified: Secondary | ICD-10-CM

## 2020-10-20 NOTE — Progress Notes (Signed)
Reviewed and agree with management plan.  Ruben Pyka T. Kadan Millstein, MD FACG [phone]  

## 2020-10-24 ENCOUNTER — Ambulatory Visit (HOSPITAL_COMMUNITY): Payer: PRIVATE HEALTH INSURANCE

## 2020-10-26 ENCOUNTER — Telehealth: Payer: Self-pay

## 2020-10-26 NOTE — Telephone Encounter (Signed)
Left detailed message for patient to remind her that she is due for repeat labs at this time. No appointment is necessary. Advised that she can stop by the lab in the basement at her convenience between 7:30 AM - 5 PM, Monday through Friday. Advised patient to call us back if she had any questions.

## 2020-10-26 NOTE — Telephone Encounter (Signed)
-----   Message from Yevette Edwards, RN sent at 10/19/2020 12:19 PM EDT ----- Regarding: Labs Repeat CBC, order in epic.

## 2020-10-30 NOTE — Telephone Encounter (Signed)
CT scan has been cancelled.

## 2020-10-31 ENCOUNTER — Ambulatory Visit (HOSPITAL_COMMUNITY): Payer: PRIVATE HEALTH INSURANCE

## 2020-11-06 ENCOUNTER — Other Ambulatory Visit (INDEPENDENT_AMBULATORY_CARE_PROVIDER_SITE_OTHER): Payer: PRIVATE HEALTH INSURANCE

## 2020-11-06 DIAGNOSIS — D649 Anemia, unspecified: Secondary | ICD-10-CM

## 2020-11-06 DIAGNOSIS — R1031 Right lower quadrant pain: Secondary | ICD-10-CM | POA: Diagnosis not present

## 2020-11-06 DIAGNOSIS — R110 Nausea: Secondary | ICD-10-CM | POA: Diagnosis not present

## 2020-11-06 LAB — CBC WITH DIFFERENTIAL/PLATELET
Basophils Absolute: 0 10*3/uL (ref 0.0–0.1)
Basophils Relative: 0.4 % (ref 0.0–3.0)
Eosinophils Absolute: 0.1 10*3/uL (ref 0.0–0.7)
Eosinophils Relative: 1.3 % (ref 0.0–5.0)
HCT: 35.8 % — ABNORMAL LOW (ref 36.0–46.0)
Hemoglobin: 11.7 g/dL — ABNORMAL LOW (ref 12.0–15.0)
Lymphocytes Relative: 35.7 % (ref 12.0–46.0)
Lymphs Abs: 1.8 10*3/uL (ref 0.7–4.0)
MCHC: 32.8 g/dL (ref 30.0–36.0)
MCV: 82.2 fl (ref 78.0–100.0)
Monocytes Absolute: 0.6 10*3/uL (ref 0.1–1.0)
Monocytes Relative: 11.8 % (ref 3.0–12.0)
Neutro Abs: 2.5 10*3/uL (ref 1.4–7.7)
Neutrophils Relative %: 50.8 % (ref 43.0–77.0)
Platelets: 307 10*3/uL (ref 150.0–400.0)
RBC: 4.36 Mil/uL (ref 3.87–5.11)
RDW: 14.1 % (ref 11.5–15.5)
WBC: 4.9 10*3/uL (ref 4.0–10.5)

## 2020-11-07 LAB — HEMOCCULT SLIDES (X 3 CARDS)
Fecal Occult Blood: NEGATIVE
OCCULT 1: NEGATIVE
OCCULT 2: NEGATIVE
OCCULT 3: NEGATIVE
OCCULT 4: NEGATIVE
OCCULT 5: NEGATIVE

## 2021-02-10 ENCOUNTER — Other Ambulatory Visit: Payer: Self-pay | Admitting: Physician Assistant

## 2021-02-27 ENCOUNTER — Encounter: Payer: Self-pay | Admitting: Neurology

## 2021-02-27 ENCOUNTER — Telehealth: Payer: Self-pay | Admitting: Neurology

## 2021-02-27 ENCOUNTER — Ambulatory Visit (INDEPENDENT_AMBULATORY_CARE_PROVIDER_SITE_OTHER): Payer: No Typology Code available for payment source | Admitting: Neurology

## 2021-02-27 VITALS — BP 122/82 | HR 88 | Ht 65.0 in | Wt 142.0 lb

## 2021-02-27 DIAGNOSIS — M797 Fibromyalgia: Secondary | ICD-10-CM

## 2021-02-27 DIAGNOSIS — R413 Other amnesia: Secondary | ICD-10-CM | POA: Diagnosis not present

## 2021-02-27 DIAGNOSIS — R202 Paresthesia of skin: Secondary | ICD-10-CM

## 2021-02-27 DIAGNOSIS — G4489 Other headache syndrome: Secondary | ICD-10-CM | POA: Diagnosis not present

## 2021-02-27 MED ORDER — DULOXETINE HCL 30 MG PO CPEP: ORAL_CAPSULE | ORAL | 3 refills | Status: DC

## 2021-02-27 NOTE — Patient Instructions (Signed)
We will start Cymbalta for the headache and neck pain.  Cymbalta (duloxetine) is an antidepressant medication that is commonly used for peripheral neuropathy pain or for fibromyalgia pain. As with any antidepressant medication, worsening depression can be seen. This medication can potentially cause headache, dizziness, sexual dysfunction, or nausea. If any problems are noted on this medication, please contact our office.

## 2021-02-27 NOTE — Telephone Encounter (Signed)
Pt refused to schedule f/u with NP Sarah at check-out for today's appt with Dr. Jannifer Franklin. Called pt and LVM to inform her that Dr. Jannifer Franklin recommends her to f/u with Dr. Jaynee Eagles since she is refusing NP. If pt returns call, please set up 6 month f/u with Dr. Jaynee Eagles.

## 2021-02-27 NOTE — Progress Notes (Signed)
Reason for visit: Fibromyalgia, mild memory disorder, paresthesias, headache  Amanda Waters is an 65 y.o. female  History of present illness:  Amanda Waters is a 65 year old right-handed white female with multiple somatic complaints.  The patient has fibromyalgia and has diffuse neuromuscular discomfort in the neck, shoulders, back and hips.  She reports that she has some dizziness with standing up frequently, she was told that she may have POTS.  She has a longstanding history of headaches that are mainly bifrontal in nature but may be in the parietal areas as well.  She has chronic neck discomfort, and she continues to report paresthesias and numbness of the hands and feet bilaterally that began on all 4's at the same time. She has not wanted to undergo nerve conduction studies previously.  She does report some ongoing issues with insomnia.  She has ongoing mild cognitive issues that do not impact her ability to function day-to-day.  She does report some difficulty with word finding.  Her issues with occasional jaw spasms have decreased over time.  She remains on gabapentin.  In the past she was given nortriptyline, but never took the medication.  She indicates that she had COVID again in July 2022, her headaches have been more of an issue since that time.  She returns here for further evaluation.  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    Hyperlipidemia    IBS (irritable bowel syndrome)    Infection due to non-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-producing Escherichia coli (E.coli)    Iron deficiency anemia    Kidney infection    Kidney stones    Left radial fracture     Radial head fracture   Memory difficulty 12/16/2013   Occipital neuralgia    Osteopenia    Sleep apnea    Swollen lymph nodes     Past Surgical History:  Procedure Laterality Date   Broken Elbow Bilateral    CHOLECYSTECTOMY     COLONOSCOPY     CYSTECTOMY     DILATION AND CURETTAGE OF UTERUS     hammartoe surgery Left    2nd toe   POLYPECTOMY     SHOULDER SURGERY     TONSILLECTOMY      Family History  Problem Relation Age of Onset   Fibromyalgia Mother    Colon polyps Mother    Diabetes Father    Thyroid cancer Father    Heart disease Father    Dementia Father    Colon polyps Father    Colon cancer Neg Hx    Esophageal cancer Neg Hx    Liver cancer Neg Hx    Pancreatic cancer Neg Hx    Stomach cancer Neg Hx    Rectal cancer Neg Hx     Social history:  reports that she has never smoked. She has never used smokeless tobacco. She reports current alcohol use. She reports that she does not use drugs.    Allergies  Allergen Reactions   Celebrex [Celecoxib]    Darvon [Propoxyphene]    Duract [Bromfenac]    Nitrofuran Derivatives  Headache    Oxycodone-Acetaminophen    Prednisone    Sulfa Antibiotics     Medications:  Prior to Admission medications   Medication Sig Start Date End Date Taking? Authorizing Provider  b complex vitamins tablet Take 1 tablet by mouth daily.    [provider]  Calcium Carbonate-Vitamin D 600-400 MG-UNIT chew tablet Chew 1 tablet by mouth daily.    [provider]  cyclobenzaprine (FLEXERIL) 10 MG tablet Take 10 mg by mouth 3 (three) times daily as needed.    [provider]  diclofenac (FLECTOR) 1.3 % PTCH Place 1 patch onto the skin daily as needed.    [provider]  famciclovir (FAMVIR) 500 MG tablet Take 500 mg by mouth daily.    [provider]  famotidine (PEPCID) 20 MG tablet Take 20 mg by mouth at bedtime.    [provider]  gabapentin (NEURONTIN) 600 MG tablet TAKE 1  TABLET BY MOUTH THREE TIMES A DAY 10/10/20   Kathrynn Ducking, MD  HYDROcodone-acetaminophen (NORCO/VICODIN) 5-325 MG per tablet Take 1 tablet by mouth every 6 (six) hours as needed for pain.    [provider]  IMVEXXY MAINTENANCE PACK 10 MCG INST Place 1 capsule vaginally 2 (two) times a week. 06/11/19   [provider]  loratadine (CLARITIN) 10 MG tablet Take 10 mg by mouth daily as needed.     [provider]  Nutritional Supplements (DHEA PO) Take 5 mg by mouth daily.    [provider]  pantoprazole (PROTONIX) 40 MG tablet TAKE 1 TABLET BY MOUTH EVERY DAY 02/12/21   Levin Erp, PA  vitamin E 400 UNIT capsule Take 400 Units by mouth daily.    [provider]  zolpidem (AMBIEN) 5 MG tablet TAKE 1 TABLET (5 MG TOTAL) BY MOUTH AT BEDTIME AS NEEDED. FOR SLEEP 10/10/20   Kathrynn Ducking, MD    ROS:  Out of a complete 14 system review of symptoms, the patient complains only of the following symptoms, and all other reviewed systems are negative.  Headache Numbness and paresthesias of all 4 extremities Neck pain and neck stiffness Mild memory troubles  Blood pressure 122/82, pulse 88, height 5\' 5"  (1.651 m), weight 142 lb (64.4 kg), SpO2 98 %.  Physical Exam  General: The patient is alert and cooperative at the time of the examination.  Neuromuscular: The patient lacks about 20 degrees of full lateral rotation cervical spine bilaterally.  She has good flexion-extension movements of the neck.  Skin: No significant peripheral edema is noted.   Neurologic Exam  Mental status: The patient is alert and oriented x 3 at the time of the examination. The patient has apparent normal recent and remote memory, with an apparently normal attention span and concentration ability.   Cranial nerves: Facial symmetry is present. Speech is normal, no aphasia or dysarthria is noted. Extraocular movements are full. Visual fields are full.  Motor:  The patient has good strength in all 4 extremities.  Sensory examination: Soft touch sensation is symmetric on the face, arms, and legs.  Coordination: The patient has good finger-nose-finger and heel-to-shin bilaterally.  Gait and station: The patient has a normal gait. Tandem gait is normal. Romberg is negative. No drift is seen.  Reflexes: Deep tendon reflexes are symmetric.   Assessment/Plan:  1.  Fibromyalgia  2.  Mild cognitive impairment  3.  Numbness and paresthesias, all 4 extremities  4.  Frequent headache  The patient will  be placed on Cymbalta for the headache and for the fibromyalgia symptoms.  She will start at 30 mg daily for 2 weeks and then go to 30 mg twice daily.  She will be set up for MRI of the cervical spine.  She will follow-up here in 6 months, in the future she can be followed through Dr. Jaynee Eagles.  She will continue the gabapentin.  Jill Alexanders MD 02/27/2021 9:59 AM  [facility] [address] [place], [place] [address]  Phone [phone] Fax [phone]

## 2021-03-15 ENCOUNTER — Other Ambulatory Visit: Payer: Self-pay | Admitting: Neurology

## 2021-03-22 ENCOUNTER — Other Ambulatory Visit: Payer: Self-pay | Admitting: Neurology

## 2021-05-16 ENCOUNTER — Other Ambulatory Visit: Payer: Self-pay | Admitting: Physician Assistant

## 2021-05-17 ENCOUNTER — Other Ambulatory Visit: Payer: Self-pay | Admitting: Physician Assistant

## 2021-05-24 ENCOUNTER — Other Ambulatory Visit: Payer: Self-pay | Admitting: Physician Assistant

## 2021-08-21 ENCOUNTER — Ambulatory Visit (INDEPENDENT_AMBULATORY_CARE_PROVIDER_SITE_OTHER): Payer: PRIVATE HEALTH INSURANCE | Admitting: Neurology

## 2021-08-21 ENCOUNTER — Encounter: Payer: Self-pay | Admitting: Neurology

## 2021-08-21 VITALS — BP 132/80 | HR 86 | Ht 65.0 in | Wt 143.6 lb

## 2021-08-21 DIAGNOSIS — G8929 Other chronic pain: Secondary | ICD-10-CM

## 2021-08-21 DIAGNOSIS — M5412 Radiculopathy, cervical region: Secondary | ICD-10-CM | POA: Insufficient documentation

## 2021-08-21 DIAGNOSIS — M5481 Occipital neuralgia: Secondary | ICD-10-CM

## 2021-08-21 DIAGNOSIS — R292 Abnormal reflex: Secondary | ICD-10-CM | POA: Diagnosis not present

## 2021-08-21 DIAGNOSIS — R200 Anesthesia of skin: Secondary | ICD-10-CM

## 2021-08-21 DIAGNOSIS — R202 Paresthesia of skin: Secondary | ICD-10-CM

## 2021-08-21 DIAGNOSIS — M542 Cervicalgia: Secondary | ICD-10-CM | POA: Diagnosis not present

## 2021-08-21 MED ORDER — ZOLPIDEM TARTRATE 5 MG PO TABS: 5.0000 mg | ORAL_TABLET | Freq: Every evening | ORAL | 3 refills | Status: AC | PRN

## 2021-08-21 NOTE — Progress Notes (Signed)
? ? ?Reason for visit: Fibromyalgia, mild memory disorder, paresthesias, headache ? ?Amanda Waters is an 66 y.o. female ? ?History of present illness: ? ?08/21/2020: This is a patient who is transitioning to me from my former colleague Dr. Jannifer Waters who retired.  She has mild memory disorder, paresthesias, headache and fibromyalgia.  I reviewed Dr. Tobey Grim note, she has multiple somatic complaints, for fibromyalgia she has diffuse neuromuscular discomfort in her neck shoulders back and hips, some dizziness standing up frequently, bifrontal headaches, paresthesias of the hands and feet bilaterally and has declined nerve conduction studies, insomnia, cognitive issues that do not impact her ability to function day-to-day.  She remains on gabapentin.  We discussed that we can treat her headaches, at this time I do not treat fibromyalgia, we could send her for pain management or back to primary care. ? ?She has occipital neuralgia, she was a patient of Amanda Waters, shooting up on the right side, dr Amanda Waters ordered an MRI cervical spine but she never completed it. She has had neck pain for years. She has been on gabapentin upwards of '3600mg'$  now on '1800mg'$ . She has been to PT for > 6 weeks, had dry needling completed. Continue to have chronic neck pain, she has been to fascial release, she has chronic neck pain, she can also have dizziness with neck pain. She carries a lot of stress in her neck. She has shooting pains in her head, she has numbness and tingling in her hands, she had an EMG/NCS and refuses to have another one. We discussed facet blocks, we discussed ESI and also RFA/gamma knife. She was prescribed cymbalta last by Dr. Jannifer Waters but she is not taking it. She has some TMJ and jaw locking. She has gotten used to brain fog, she lost her father to alzheimers and she is doing everything she can to stay. Discussed the "XX Brain", she is involved in a research trial at [facility] for memory loss. Her headaches are  throbbing, light and sound sensitivity, on the right just lateral to the vertex, pulsating, nausea if severe, excedrin migraine helps, she was having headaches daily after covid now they are daily.  ? ?Reviewed the following images personally and with patient and husband and agree 09/2017: ? ?MRI brain (without) demonstrating: ?- Mild scattered periventricular, subcortical and juxtacortical foci of gliosis. These findings are non-specific and considerations include autoimmune, inflammatory, post-infectious, microvascular ischemic or migraine associated etiologies.  ?- No acute findings. ?  ?  ? ?Patient complains of symptoms per HPI as well as the following symptoms: neck pain . Pertinent negatives and positives per HPI. All others negative ? ?Dr. Jannifer Waters 10/22: Ms. Amanda Waters is a 66 year old right-handed white female with multiple somatic complaints.  The patient has fibromyalgia and has diffuse neuromuscular discomfort in the neck, shoulders, back and hips.  She reports that she has some dizziness with standing up frequently, she was told that she may have POTS.  She has a longstanding history of headaches that are mainly bifrontal in nature but may be in the parietal areas as well.  She has chronic neck discomfort, and she continues to report paresthesias and numbness of the hands and feet bilaterally that began on all 4's at the same time. She has not wanted to undergo nerve conduction studies previously.  She does report some ongoing issues with insomnia.  She has ongoing mild cognitive issues that do not impact her ability to function day-to-day.  She does report some difficulty with word finding.  Her issues with occasional jaw spasms have decreased over time.  She remains on gabapentin.  In the past she was given nortriptyline, but never took the medication.  She indicates that she had COVID again in July 2022, her headaches have been more of an issue since that time.  She returns here for further  evaluation. ? ?Past Medical History:  ?Diagnosis Date  ? Abnormal finding on MRI of brain 12/19/2014  ? Adenomatous polyp of colon 2006  ? Allergy   ? Chronic back pain   ? Dyslipidemia   ? Fibromyalgia   ? GERD (gastroesophageal reflux disease)   ? History of carpal tunnel syndrome   ? History of hemorrhoids   ? HSV-1 (herpes simplex virus 1) infection   ? HSV-2 (herpes simplex virus 2) infection   ? Hyperlipidemia   ? IBS (irritable bowel syndrome)   ? Infection due to non-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-producing Escherichia coli (E.coli)   ? Iron deficiency anemia   ? Kidney infection   ? Kidney stones   ? Left radial fracture   ? Radial head fracture  ? Memory difficulty 12/16/2013  ? Occipital neuralgia   ? Osteopenia   ? Sleep apnea   ? Swollen lymph nodes   ? ? ?Past Surgical History:  ?Procedure Laterality Date  ? Broken Elbow Bilateral   ? CHOLECYSTECTOMY    ? COLONOSCOPY    ? CYSTECTOMY    ? DILATION AND CURETTAGE OF UTERUS    ? hammartoe surgery Left   ? 2nd toe  ? POLYPECTOMY    ? SHOULDER SURGERY    ? TONSILLECTOMY    ? ? ?Family History  ?Problem Relation Age of Onset  ? Fibromyalgia Mother   ? Colon polyps Mother   ? Diabetes Father   ? Thyroid cancer Father   ? Heart disease Father   ? Dementia Father   ? Colon polyps Father   ? Colon cancer Neg Hx   ? Esophageal cancer Neg Hx   ? Liver cancer Neg Hx   ? Pancreatic cancer Neg Hx   ? Stomach cancer Neg Hx   ? Rectal cancer Neg Hx   ? ? ?Social history:  reports that she has never smoked. She has never used smokeless tobacco. She reports current alcohol use. She reports that she does not use drugs. ? ?  ?Allergies  ?Allergen Reactions  ? Celebrex [Celecoxib]   ? Darvon [Propoxyphene]   ? Duract [Bromfenac]   ? Nitrofuran Derivatives   ?  Headache   ? Oxycodone-Acetaminophen   ? Prednisone   ? Sulfa Antibiotics   ? ? ?Medications:  ?Prior to Admission medications   ?Medication Sig Start Date End Date Taking? Authorizing Provider  ?b complex vitamins tablet Take 1 tablet  by mouth daily.    [provider]  ?Calcium Carbonate-Vitamin D 600-400 MG-UNIT chew tablet Chew 1 tablet by mouth daily.    [provider]  ?cyclobenzaprine (FLEXERIL) 10 MG tablet Take 10 mg by mouth 3 (three) times daily as needed.    [provider]  ?diclofenac (FLECTOR) 1.3 % PTCH Place 1 patch onto the skin daily as needed.    [provider]  ?famciclovir (FAMVIR) 500 MG tablet Take 500 mg by mouth daily.    [provider]  ?famotidine (PEPCID) 20 MG tablet Take 20 mg by mouth at bedtime.    [provider]  ?gabapentin (NEURONTIN) 600 MG tablet TAKE 1 TABLET BY MOUTH THREE TIMES A  DAY 10/10/20   Kathrynn Ducking, MD  ?HYDROcodone-acetaminophen (NORCO/VICODIN) 5-325 MG per tablet Take 1 tablet by mouth every 6 (six) hours as needed for pain.    [provider]  ?IMVEXXY MAINTENANCE PACK 10 MCG INST Place 1 capsule vaginally 2 (two) times a week. 06/11/19   [provider]  ?loratadine (CLARITIN) 10 MG tablet Take 10 mg by mouth daily as needed.     [provider]  ?Nutritional Supplements (DHEA PO) Take 5 mg by mouth daily.    [provider]  ?pantoprazole (PROTONIX) 40 MG tablet TAKE 1 TABLET BY MOUTH EVERY DAY 02/12/21   Levin Erp, PA  ?vitamin E 400 UNIT capsule Take 400 Units by mouth daily.    [provider]  ?zolpidem (AMBIEN) 5 MG tablet TAKE 1 TABLET (5 MG TOTAL) BY MOUTH AT BEDTIME AS NEEDED. FOR SLEEP 10/10/20   Kathrynn Ducking, MD  ? ? ?ROS: ? ?Out of a complete 14 system review of symptoms, the patient complains only of the following symptoms, and all other reviewed systems are negative. ? ?Headache ?Numbness and paresthesias of all 4 extremities ?Neck pain and neck stiffness ?Mild memory troubles ? ?Exam: ?NAD, pleasant                  ?Speech: ?   Speech is normal; fluent and spontaneous with normal comprehension.  ?Cognition: ?   The patient is oriented to person, place, and  time;  ?   recent and remote memory intact;  ?   language fluent;  ?  Cranial Nerves: ?   The pupils are equal, round, and reactive to light.Trigeminal sensation is intact and the muscles of mastication are normal. The f

## 2021-08-21 NOTE — Patient Instructions (Addendum)
?MRI of the cervical spine ?Continue Gabapentin ?We will call with MRI results and further steps for injections as discussed. ?Follow up 6 months ? ? ?Occipital neuralgia ? ? ? ? ? ?Occipital Neuralgia ?Occipital neuralgia is a type of headache that causes brief episodes of very bad pain in the back of the head. Pain from occipital neuralgia may spread (radiate) to other parts of the head. ?These headaches may be caused by irritation of the nerves that leave the spinal cord high up in the neck, just below the base of the skull (occipital nerves). The occipital nerves transmit sensations from the back of the head, the top of the head, and the areas behind the ears. ?What are the causes? ?This condition can occur without any known cause (primary headache syndrome). In other cases, this condition is caused by pressure on or irritation of one of the two occipital nerves. Pressure and irritation may be due to: ?Muscle spasm in the neck. ?Neck injury. ?Wear and tear of the vertebrae in the neck (osteoarthritis). ?Disease of the disks that separate the vertebrae. ?Swollen blood vessels that put pressure on the occipital nerves. ?Infections. ?Tumors. ?Diabetes. ?What are the signs or symptoms? ?This condition causes brief burning, stabbing, electric, shocking, or shooting pain in the back of the head that can radiate to the top of the head. It can happen on one side or both sides of the head. It can also cause: ?Pain behind the eye. ?Pain triggered by neck movement or hair brushing. ?Scalp tenderness. ?Aching in the back of the head between episodes of very bad pain. ?Pain that gets worse with exposure to bright lights. ?How is this diagnosed? ?Your health care provider may diagnose the condition based on a physical exam and your symptoms. Tests may be done, such as: ?Imaging studies of the brain and neck (cervical spine), such as an MRI or CT scan. These look for causes of pinched nerves. ?Applying pressure to the nerves  in the neck to try to re-create the pain. ?Injection of numbing medicine into the occipital nerve areas to see if pain goes away (diagnostic nerve block). ?How is this treated? ?Treatment for this condition may begin with simple measures, such as: ?Rest. ?Massage. ?Applying heat or cold to the area. ?Over-the-counter pain relievers. ?If these measures do not work, you may need other treatments, including: ?Medicines, such as: ?Prescription-strength anti-inflammatory medicines. ?Muscle relaxants. ?Anti-seizure medicines, which can relieve pain. ?Antidepressants, which can relieve pain. ?Injected medicines, such as medicines that numb the area (local anesthetic) and steroids. ?Pulsed radiofrequency ablation. This is when wires are implanted to deliver electrical impulses that block pain signals from the occipital nerve. ?Surgery to relieve nerve pressure. ?Physical therapy. ?Follow these instructions at home: ?Managing pain ?  ?Avoid any activities that cause pain. ?Rest when you have an attack of pain. ?Try gentle massage to relieve pain. ?Try a different pillow or sleeping position. ?If directed, apply heat to the affected area as often as told by your health care provider. Use the heat source that your health care provider recommends, such as a moist heat pack or a heating pad. ?Place a towel between your skin and the heat source. ?Leave the heat on for 20-30 minutes. ?Remove the heat if your skin turns bright red. This is especially important if you are unable to feel pain, heat, or cold. You have a greater risk of getting burned. ?If directed, put ice on the back of your head and neck area. To do  this: ?Put ice in a plastic bag. ?Place a towel between your skin and the bag. ?Leave the ice on for 20 minutes, 2-3 times a day. ?Remove the ice if your skin turns bright red. This is very important. If you cannot feel pain, heat, or cold, you have a greater risk of damage to the area. ?General instructions ?Take  over-the-counter and prescription medicines only as told by your health care provider. ?Avoid things that make your symptoms worse, such as bright lights. ?Try to stay active. Get regular exercise that does not cause pain. Ask your health care provider to suggest safe exercises for you. ?Work with a physical therapist to learn stretching exercises you can do at home. ?Practice good posture. ?Keep all follow-up visits. This is important. ?Contact a health care provider if: ?Your medicine is not working. ?You have new or worsening symptoms. ?Get help right away if: ?You have very bad head pain that does not go away. ?You have a sudden change in vision, balance, or speech. ?These symptoms may represent a serious problem that is an emergency. Do not wait to see if the symptoms will go away. Get medical help right away. Call your local emergency services (911 in the [place] Do not drive yourself to the hospital. ?Summary ?Occipital neuralgia is a type of headache that causes brief episodes of very bad pain in the back of the head. ?Pain from occipital neuralgia may spread (radiate) to other parts of the head. ?Treatment for this condition includes rest, massage, and medicines. ?This information is not intended to replace advice given to you by your health care provider. Make sure you discuss any questions you have with your health care provider. ?Document Revised: 03/05/2020 Document Reviewed: 03/05/2020 ?Elsevier Patient Education ? [place]. ? ? ?Analgesic Rebound Headache ?An analgesic rebound headache, sometimes called a medication overuse headache or a drug-induced headache, is a secondary disorder that is caused by the overuse of pain medicine (analgesic) to treat the original (primary) headache. Any type of primary headache can return as a rebound headache if a person regularly takes analgesics. ?The types of primary headaches that are commonly associated with rebound headaches  include: ?Migraines. ?Headaches that are caused by tense muscles in the head and neck area (tension headaches). ?Headaches that develop and happen again on one side of the head and around the eye (cluster headaches). ?If rebound headaches continue, they can become long-term, daily headaches. ?What are the causes? ?This condition may be caused by frequent use of: ?Over-the-counter medicines such as aspirin, ibuprofen, and acetaminophen. ?Sinus-relief medicines and medicines that contain caffeine. ?Narcotic pain medicines such as codeine and oxycodone. ?Some prescription migraine medicines. ?What are the signs or symptoms? ?The symptoms of a rebound headache are the same as the symptoms of the original headache. Some of the symptoms of specific types of headaches include: ?Migraine headache ?Pulsing or throbbing pain on one or both sides of the head. ?Severe pain that interferes with daily activities. ?Pain that gets worse with physical activity. ?Nausea, vomiting, or both. ?Pain and sensitivity with exposure to bright light, loud noises, or strong smells. ?Visual changes. ?Numbness of one or both arms. ?Tension headache ?Pressure around the head. ?Dull, aching head pain. ?Pain felt over the front and sides of the head. ?Tenderness in the muscles of the head, neck, and shoulders. ?Cluster headache ?Severe pain that begins in or around one eye or temple. ?Droopy or swollen eyelid, or redness and tearing in the eye on the same  side as the pain. ?One-sided head pain. ?Nausea. ?Runny nose. ?Sweaty, pale facial skin. ?Restlessness. ?How is this diagnosed? ?This condition is diagnosed by: ?Reviewing your medical history. This includes the nature of your primary headaches. ?Reviewing the types of pain medicines that you have been using to treat your primary headaches and how often you take them. ?How is this treated? ?This condition may be treated or managed by: ?Discontinuing frequent use of the analgesic medicine. Doing  this may worsen your headaches at first, but the pain should eventually become more manageable, less frequent, and less severe. ?Seeing a headache specialist. He or she may be able to help you manage your headaches and help make sure th

## 2021-08-23 ENCOUNTER — Telehealth: Payer: Self-pay | Admitting: Neurology

## 2021-08-23 NOTE — Telephone Encounter (Signed)
[facility] order sent to [facility], they will obtain the auth and reach out to the patient to schedule.  ?

## 2021-08-28 ENCOUNTER — Ambulatory Visit: Payer: No Typology Code available for payment source | Admitting: Neurology

## 2021-10-30 ENCOUNTER — Other Ambulatory Visit: Payer: Self-pay | Admitting: *Deleted

## 2021-10-30 ENCOUNTER — Other Ambulatory Visit: Payer: Self-pay | Admitting: Physician Assistant

## 2021-10-30 MED ORDER — GABAPENTIN 600 MG PO TABS: 600.0000 mg | ORAL_TABLET | Freq: Three times a day (TID) | ORAL | 1 refills | Status: AC

## 2021-11-08 ENCOUNTER — Encounter (HOSPITAL_COMMUNITY): Payer: Self-pay | Admitting: Emergency Medicine

## 2021-11-08 ENCOUNTER — Emergency Department (HOSPITAL_COMMUNITY): Payer: PRIVATE HEALTH INSURANCE

## 2021-11-08 ENCOUNTER — Emergency Department (HOSPITAL_COMMUNITY)
Admission: EM | Admit: 2021-11-08 | Discharge: 2021-11-08 | Disposition: A | Discharge status: Home / Self Care | Payer: PRIVATE HEALTH INSURANCE | Attending: Emergency Medicine | Admitting: Emergency Medicine

## 2021-11-08 DIAGNOSIS — R000 Tachycardia, unspecified: Secondary | ICD-10-CM | POA: Insufficient documentation

## 2021-11-08 DIAGNOSIS — R1031 Right lower quadrant pain: Secondary | ICD-10-CM | POA: Diagnosis present

## 2021-11-08 DIAGNOSIS — R7401 Elevation of levels of liver transaminase levels: Secondary | ICD-10-CM | POA: Insufficient documentation

## 2021-11-08 DIAGNOSIS — I10 Essential (primary) hypertension: Secondary | ICD-10-CM | POA: Diagnosis not present

## 2021-11-08 DIAGNOSIS — R110 Nausea: Secondary | ICD-10-CM | POA: Insufficient documentation

## 2021-11-08 LAB — CBC WITH DIFFERENTIAL/PLATELET
Abs Immature Granulocytes: 0.02 10*3/uL (ref 0.00–0.07)
Basophils Absolute: 0 10*3/uL (ref 0.0–0.1)
Basophils Relative: 0 %
Eosinophils Absolute: 0.1 10*3/uL (ref 0.0–0.5)
Eosinophils Relative: 1 %
HCT: 40.5 % (ref 36.0–46.0)
Hemoglobin: 12.7 g/dL (ref 12.0–15.0)
Immature Granulocytes: 0 %
Lymphocytes Relative: 29 %
Lymphs Abs: 1.7 10*3/uL (ref 0.7–4.0)
MCH: 26 pg (ref 26.0–34.0)
MCHC: 31.4 g/dL (ref 30.0–36.0)
MCV: 82.8 fL (ref 80.0–100.0)
Monocytes Absolute: 0.4 10*3/uL (ref 0.1–1.0)
Monocytes Relative: 6 %
Neutro Abs: 3.6 10*3/uL (ref 1.7–7.7)
Neutrophils Relative %: 64 %
Platelets: 327 10*3/uL (ref 150–400)
RBC: 4.89 MIL/uL (ref 3.87–5.11)
RDW: 14.1 % (ref 11.5–15.5)
WBC: 5.7 10*3/uL (ref 4.0–10.5)
nRBC: 0 % (ref 0.0–0.2)

## 2021-11-08 LAB — COMPREHENSIVE METABOLIC PANEL
ALT: 53 U/L — ABNORMAL HIGH (ref 0–44)
AST: 48 U/L — ABNORMAL HIGH (ref 15–41)
Albumin: 4.2 g/dL (ref 3.5–5.0)
Alkaline Phosphatase: 41 U/L (ref 38–126)
Anion gap: 11 (ref 5–15)
BUN: 10 mg/dL (ref 8–23)
CO2: 22 mmol/L (ref 22–32)
Calcium: 9.2 mg/dL (ref 8.9–10.3)
Chloride: 106 mmol/L (ref 98–111)
Creatinine, Ser: 1.02 mg/dL — ABNORMAL HIGH (ref 0.44–1.00)
GFR, Estimated: >60 mL/min (ref >60)
Glucose, Bld: 134 mg/dL — ABNORMAL HIGH (ref 70–99)
Potassium: 3.8 mmol/L (ref 3.5–5.1)
Sodium: 139 mmol/L (ref 135–145)
Total Bilirubin: 0.9 mg/dL (ref 0.3–1.2)
Total Protein: 6.9 g/dL (ref 6.5–8.1)

## 2021-11-08 LAB — URINALYSIS, ROUTINE W REFLEX MICROSCOPIC
Bilirubin Urine: NEGATIVE
Glucose, UA: NEGATIVE mg/dL
Hgb urine dipstick: NEGATIVE
Ketones, ur: NEGATIVE mg/dL
Leukocytes,Ua: NEGATIVE
Nitrite: NEGATIVE
Protein, ur: NEGATIVE mg/dL
Specific Gravity, Urine: 1.003 — ABNORMAL LOW (ref 1.005–1.030)
pH: 6 (ref 5.0–8.0)

## 2021-11-08 MED ORDER — GABAPENTIN 300 MG PO CAPS
600.0000 mg | ORAL_CAPSULE | Freq: Once | ORAL | Status: AC
  Administered 2021-11-08: 600 mg via ORAL
  Filled 2021-11-08: qty 2

## 2021-11-08 MED ORDER — IOHEXOL 300 MG/ML  SOLN
100.0000 mL | Freq: Once | INTRAMUSCULAR | Status: AC | PRN
  Administered 2021-11-08: 100 mL via INTRAVENOUS

## 2021-11-08 MED ORDER — HYDROMORPHONE HCL 1 MG/ML IJ SOLN
1.0000 mg | Freq: Once | INTRAMUSCULAR | Status: AC
  Administered 2021-11-08: 1 mg via INTRAMUSCULAR
  Filled 2021-11-08: qty 1

## 2021-11-08 NOTE — ED Provider Notes (Signed)
[facility] EMERGENCY DEPARTMENT Provider Note   CSN: [id number] Arrival date & time: 11/08/21  1504     History  Chief Complaint  Patient presents with   Abdominal Pain    Amanda Waters is a 66 y.o. female.   Abdominal Pain   Patient with medical history of GERD presents today due to abdominal pain.  Abdominal pain started 5 days ago epigastrically and periumbilically, has been intermittent and feels like cramping and is worse when she urinates.  Starting 2 days ago it localized to the right lower quadrant starting today it became sharp and was associated with nausea but no vomiting.  Never had pain like this before, denies any alleviating factors although she tried Tylenol and heating pads.  No change in bowel habits, no vaginal bleeding.  Patient is status post cholecystectomy.  Home Medications Prior to Admission medications   Medication Sig Start Date End Date Taking? Authorizing Provider  Calcium Carbonate-Vitamin D 600-400 MG-UNIT chew tablet Chew 1 tablet by mouth daily.    [provider]  cyclobenzaprine (FLEXERIL) 10 MG tablet Take 10 mg by mouth 3 (three) times daily as needed.    [provider]  famciclovir (FAMVIR) 500 MG tablet Take 500 mg by mouth daily.    [provider]  famotidine (PEPCID) 20 MG tablet Take 20 mg by mouth at bedtime.    [provider]  gabapentin (NEURONTIN) 600 MG tablet Take 1 tablet (600 mg total) by mouth 3 (three) times daily. 10/30/21   Anson Fret, MD  HYDROcodone-acetaminophen (NORCO/VICODIN) 5-325 MG per tablet Take 1 tablet by mouth every 6 (six) hours as needed for pain.    [provider]  IMVEXXY MAINTENANCE PACK 10 MCG INST Place 1 capsule vaginally 2 (two) times a week. 06/11/19   [provider]  Multiple Vitamins-Minerals (CENTRUM SILVER ADULT 50+ PO) Take by mouth daily.    [provider]  Nutritional Supplements (DHEA PO) Take 5 mg by  mouth daily.    [provider]  pantoprazole (PROTONIX) 40 MG tablet TAKE ONE TABLET BY MOUTH ONE TIME DAILY 10/30/21   Unk Lightning, PA  vitamin E 400 UNIT capsule Take 400 Units by mouth daily.    [provider]  zolpidem (AMBIEN) 5 MG tablet Take 1 tablet (5 mg total) by mouth at bedtime as needed. for sleep 08/21/21   Anson Fret, MD      Allergies    Celebrex [celecoxib], Darvon [propoxyphene], Duract [bromfenac], Nitrofuran derivatives, Oxycodone-acetaminophen, Prednisone, and Sulfa antibiotics    Review of Systems   Review of Systems  Gastrointestinal:  Positive for abdominal pain.    Physical Exam Updated Vital Signs BP (!) 192/88   Pulse 81   Temp 98.1 F (36.7 C) (Oral)   Resp 18   SpO2 100%  Physical Exam Vitals and nursing note reviewed. Exam conducted with a chaperone present.  Constitutional:      Appearance: Normal appearance.  HENT:     Head: Normocephalic and atraumatic.  Eyes:     General: No scleral icterus.       Right eye: No discharge.        Left eye: No discharge.     Extraocular Movements: Extraocular movements intact.     Pupils: Pupils are equal, round, and reactive to light.  Cardiovascular:     Rate and Rhythm: Normal rate and regular rhythm.     Pulses: Normal pulses.  Heart sounds: Normal heart sounds. No murmur heard.    No friction rub. No gallop.  Pulmonary:     Effort: Pulmonary effort is normal. No respiratory distress.     Breath sounds: Normal breath sounds.  Abdominal:     General: Abdomen is flat. Bowel sounds are normal. There is no distension.     Palpations: Abdomen is soft.     Tenderness: There is abdominal tenderness in the right lower quadrant.  Skin:    General: Skin is warm and dry.     Coloration: Skin is not jaundiced.  Neurological:     Mental Status: She is alert. Mental status is at baseline.     Coordination: Coordination normal.     ED Results / Procedures / Treatments    Labs (all labs ordered are listed, but only abnormal results are displayed) Labs Reviewed  COMPREHENSIVE METABOLIC PANEL - Abnormal; Notable for the following components:      Result Value   Glucose, Bld 134 (*)    Creatinine, Ser 1.02 (*)    AST 48 (*)    ALT 53 (*)    All other components within normal limits  URINALYSIS, ROUTINE W REFLEX MICROSCOPIC - Abnormal; Notable for the following components:   Color, Urine STRAW (*)    Specific Gravity, Urine 1.003 (*)    All other components within normal limits  CBC WITH DIFFERENTIAL/PLATELET    EKG None  Radiology No results found.  Procedures Procedures    Medications Ordered in ED Medications  HYDROmorphone (DILAUDID) injection 1 mg (1 mg Intramuscular Given 11/08/21 1820)  gabapentin (NEURONTIN) capsule 600 mg (600 mg Oral Given 11/08/21 1840)    ED Course/ Medical Decision Making/ A&P                           Medical Decision Making Risk Prescription drug management.   Patient presents due to abdominal pain.  Differential diagnosis is broad but includes appendicitis, SBO, colitis, UTI, pyelonephritis.  On exam she does have right lower quadrant tenderness, there is no rebound or peritoneal signs.  Patient is slightly hypertensive but not febrile, tachycardic or hypoxic.  Her lungs are clear to auscultation bilaterally.  Reviewed external records including surgical history.  Postcholecystectomy but has appendix.  HPI above for additional information.  I ordered and viewed the laboratory work-up.  No leukocytosis or anemia.  No gross electrolyte derangement, patient does have slightly elevated AST and ALT but she is status post cholecystectomy.  Additionally no right upper quadrant tenderness or pain.  UA is without any evidence of UTI.  I ordered patient's Neurontin which she normally takes at this time.  Also reorder Dilaudid for pain.  I ordered the CT scan of the abdomen and pelvis.  This is pending at the time  of shift change.   Repeat exam patient still having right lower quadrant pain but the abdomen is soft.  Pain is improved with the Dilaudid.  Patient case discussed with attending who is taking over care.  Patient disposition pending CT abdomen but in the absence of any acute intra-abdominal process I feel patient is likely appropriate for outpatient follow-up.        Final Clinical Impression(s) / ED Diagnoses Final diagnoses:  None    Rx / DC Orders ED Discharge Orders     None         Theron Arista, Cordelia Poche 11/08/21 1848    Horton, Clabe Seal,  DO 11/08/21 2338

## 2021-11-08 NOTE — ED Provider Notes (Signed)
Patient signed out to me by previous provider. Please refer to their note for full HPI.  Briefly this is a 66 year old female who presented with right lower quadrant abdominal pain.  Blood work is reassuring, urinalysis is negative.  Patient pending CT of the abdomen pelvis at time of signout. Physical Exam  BP 130/67   Pulse 63   Temp 98.8 F (37.1 C) (Oral)   Resp 16   SpO2 96%   Physical Exam Vitals and nursing note reviewed.  Constitutional:      Appearance: Normal appearance.  HENT:     Head: Normocephalic.     Mouth/Throat:     Mouth: Mucous membranes are moist.  Cardiovascular:     Rate and Rhythm: Normal rate.  Pulmonary:     Effort: Pulmonary effort is normal. No respiratory distress.  Abdominal:     General: There is no distension.     Palpations: Abdomen is soft.     Tenderness: There is no guarding or rebound.  Skin:    General: Skin is warm.  Neurological:     Mental Status: She is alert and oriented to person, place, and time. Mental status is at baseline.  Psychiatric:        Mood and Affect: Mood normal.     Procedures  Procedures  ED Course / MDM    Medical Decision Making Risk Prescription drug management.   CT scan shows diverticulosis without diverticulitis.  No other acute findings.  Patient reveals that she does have history of IBS, last colonoscopy has been many years.  This could be inflammatory in nature as she also reveals some episodes of mucousy diarrhea.  Abdominal exam is benign for me as well, vitals have remained stable.  Do not appreciate emergent medical condition.  She already has outpatient GI follow-up.  Patient at this time appears safe and stable for discharge and close outpatient follow up. Discharge plan and strict return to ED precautions discussed, patient verbalizes understanding and agreement.       Lorelle Gibbs, DO 11/08/21 2213

## 2021-11-08 NOTE — ED Provider Triage Note (Cosign Needed)
Emergency Medicine Provider Triage Evaluation Note  Amanda Waters , a 66 y.o. female  was evaluated in triage.  Pt complains of right lower quadrant pain.  Symptoms started several days ago.  Pain is migrated to the right lower quadrant from just below the umbilicus.  No vomiting or fevers.  Patient has had intermittent diarrhea.  No urinary symptoms.  She has a history of a cholecystectomy, no other abdominal surgeries.  Seen by PCP today, sent to the emergency department for evaluation of appendicitis.  Review of Systems  Positive: Abdominal pain with nausea Negative: Fever  Physical Exam  BP (!) 192/88   Pulse 81   Temp 98.1 F (36.7 C) (Oral)   Resp 18   SpO2 100%  Gen:   Awake, no distress   Resp:  Normal effort  MSK:   Moves extremities without difficulty  Other:  Mild RLQ pain, no guarding or rebound  Medical Decision Making  Medically screening exam initiated at 3:15 PM.  Appropriate orders placed.  Amanda Waters was informed that the remainder of the evaluation will be completed by another provider, this initial triage assessment does not replace that evaluation, and the importance of remaining in the ED until their evaluation is complete.     Amanda Cater, PA-C 11/08/21 1516

## 2021-11-08 NOTE — ED Triage Notes (Signed)
Patient here with complaint of RLQ abdominal pain that started a few days ago. Patient reports pain varies in intensity from day to day and is accompanied by nausea, vomiting, and diarrhea. Patient is alert, oriented, ambulatory, and in no apparent distress at this time.

## 2021-11-08 NOTE — Discharge Instructions (Addendum)
You have been seen and discharged from the emergency department.  Your blood work, urinalysis and CT scan of the abdomen showed no acute finding.  Follow-up with your gastroenterologist and primary provider for further evaluation and further care.  You will most likely need a colonoscopy.  Take home medications as prescribed. If you have any worsening symptoms or further concerns for your health please return to an emergency department for further evaluation.

## 2021-11-08 NOTE — ED Notes (Signed)
Patient transported to CT 

## 2021-11-08 NOTE — ED Notes (Signed)
Patient ambulated to restroom independently.

## 2021-11-13 ENCOUNTER — Telehealth: Payer: Self-pay | Admitting: Gastroenterology

## 2021-11-14 NOTE — Telephone Encounter (Signed)
Called and spoke with patient. I informed her of Jennifer's recommendations. Pt has been scheduled for a hospital follow up with Ellouise Newer, PA-C on Wednesday, 12/12/21 at 10:30 am. Pt verbalized understanding and had no concerns at the end of the call.

## 2021-11-14 NOTE — Telephone Encounter (Signed)
Anderson Malta, please see note below. Pt's colonoscopy recall is due 10/2022. CT results are in epic. Thanks

## 2021-11-29 ENCOUNTER — Encounter: Payer: Self-pay | Admitting: *Deleted

## 2021-12-12 ENCOUNTER — Encounter: Payer: Self-pay | Admitting: Physician Assistant

## 2021-12-12 ENCOUNTER — Ambulatory Visit (INDEPENDENT_AMBULATORY_CARE_PROVIDER_SITE_OTHER): Payer: PRIVATE HEALTH INSURANCE | Admitting: Physician Assistant

## 2021-12-12 VITALS — BP 116/70 | HR 81 | Ht 65.0 in | Wt 143.0 lb

## 2021-12-12 DIAGNOSIS — K580 Irritable bowel syndrome with diarrhea: Secondary | ICD-10-CM | POA: Diagnosis not present

## 2021-12-12 DIAGNOSIS — R1031 Right lower quadrant pain: Secondary | ICD-10-CM

## 2021-12-12 DIAGNOSIS — K219 Gastro-esophageal reflux disease without esophagitis: Secondary | ICD-10-CM

## 2021-12-12 MED ORDER — NA SULFATE-K SULFATE-MG SULF 17.5-3.13-1.6 GM/177ML PO SOLN: ORAL | 0 refills | Status: DC

## 2021-12-12 MED ORDER — PANTOPRAZOLE SODIUM 40 MG PO TBEC: 40.0000 mg | DELAYED_RELEASE_TABLET | Freq: Every day | ORAL | 3 refills | Status: DC

## 2021-12-12 NOTE — Patient Instructions (Addendum)
We have sent the following medications to your pharmacy for you to pick up at your convenience: Pantoprazole 40 mg daily  Please follow a "low FODMAP" diet as attached.  You have been scheduled for a colonoscopy. Please follow written instructions given to you at your visit today.  Please pick up your prep supplies at the pharmacy within the next 1-3 days. If you use inhalers (even only as needed), please bring them with you on the day of your procedure.  If you are age 64 or older, your body mass index should be between 23-30. Your Body mass index is 23.8 kg/m. If this is out of the aforementioned range listed, please consider follow up with your Primary Care Provider.  If you are age 64 or younger, your body mass index should be between 19-25. Your Body mass index is 23.8 kg/m. If this is out of the aformentioned range listed, please consider follow up with your Primary Care Provider.   ________________________________________________________  The [facility] providers would like to encourage you to use [facility] to communicate with providers for non-urgent requests or questions.  Due to long hold times on the telephone, sending your provider a message by [facility] may be a faster and more efficient way to get a response.  Please allow 48 business hours for a response.  Please remember that this is for non-urgent requests.  _______________________________________________________  Due to recent changes in healthcare laws, you may see the results of your imaging and laboratory studies on MyChart before your provider has had a chance to review them.  We understand that in some cases there may be results that are confusing or concerning to you. Not all laboratory results come back in the same time frame and the provider may be waiting for multiple results in order to interpret others.  Please give [place] 48 hours in order for your provider to thoroughly review all the results before contacting the office  for clarification of your results.

## 2021-12-12 NOTE — Progress Notes (Signed)
Chief Complaint: Chronic Right lower quadrant pain  HPI:    Amanda Waters is a 66 year old female with a past medical history as listed below including fibromyalgia, reflux, IBS and multiple others, who presents to clinic today after being seen in the ER recently for right lower quadrant pain.    11/13/2017 colonoscopy with three 6-7 mm polyps in the descending and transverse colon as well as hepatic flexure, mild diverticulosis in left colon otherwise normal.  Pathology showed sessile serrated polyps without dysplasia and repeat was recommended in 5 years.  EGD on the same day with a small hiatal hernia and multiple gastric polyps and nonbleeding erosive gastropathy.  Labs returned grossly normal.  Hemoccult studies were negative.  She never had her CT done.    10/18/2020 patient seen in clinic for right lower quadrant pain.  At that time repeated CBC, iron studies and B12 and ordered Hemoccult testing.  Also ordered noncontrast CT of the abdomen pelvis for further evaluation.    11/08/2021 ER visit for right lower quadrant pain.  At that time discussed abdominal pain which is started 5 days before and felt like cramping worse with urination.  Labs showed minimal elevation in LFTs AST 48 and ALT 53, creatinine minimally elevated at 1.02.  Urinalysis normal CBC normal.  CT of the abdomen pelvis with diverticulosis and otherwise normal.    Today, patient discusses that she has continued with some episodes of right lower quadrant pain since being seen in clinic last.  Tells me she never got the CT as we discussed because her Cologuard did come back negative and she was no longer anemic.  Tells me that in May she went to see her OB/GYN and they did a pelvic exam and she had some pain on the right side.  Her OB/GYN said it was likely in her bowel.  She then was seen in the ER as above and had pain.  Symptoms had resolved completely until Friday, 12/07/2021 when the patient experienced some right lower quadrant pain  and explosive diarrhea with mucus and gas that lasted all night long after eating a salad and a frosty for lunch.  Tells me that now things seem to have righted themselves.    Reflux remains controlled on Pantoprazole 40 mg daily.    Denies fever, chills, blood in her stool, nausea, vomiting or symptoms that awaken her from sleep.  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    Hyperlipidemia    IBS (irritable bowel syndrome)    Infection due to non-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-producing Escherichia coli (E.coli)    Iron deficiency anemia    Kidney infection    Kidney stones    Left radial fracture    Radial head fracture   Memory difficulty 12/16/2013   Occipital neuralgia    Osteopenia    Sleep apnea    Swollen lymph nodes     Past Surgical History:  Procedure Laterality Date   Broken Elbow Bilateral    CHOLECYSTECTOMY     COLONOSCOPY     CYSTECTOMY  DILATION AND CURETTAGE OF UTERUS     hammartoe surgery Left    2nd toe   POLYPECTOMY     SHOULDER SURGERY     TONSILLECTOMY      Current Outpatient Medications  Medication Sig Dispense Refill   Calcium Carbonate-Vitamin D 600-400 MG-UNIT chew tablet Chew 1 tablet by mouth daily.     cyclobenzaprine (FLEXERIL) 10 MG tablet Take 10 mg by mouth 3 (three) times daily as needed.     famciclovir (FAMVIR) 500 MG tablet Take 500 mg by mouth daily.     famotidine (PEPCID) 20 MG tablet Take 20 mg by mouth at bedtime.     gabapentin (NEURONTIN) 600 MG tablet Take 1 tablet (600 mg total) by mouth 3 (three) times daily. 270 tablet 1   HYDROcodone-acetaminophen (NORCO/VICODIN) 5-325 MG per tablet  Take 1 tablet by mouth every 6 (six) hours as needed for pain.     IMVEXXY MAINTENANCE PACK 10 MCG INST Place 1 capsule vaginally 2 (two) times a week.     Multiple Vitamins-Minerals (CENTRUM SILVER ADULT 50+ PO) Take by mouth daily.     Nutritional Supplements (DHEA PO) Take 5 mg by mouth daily.     pantoprazole (PROTONIX) 40 MG tablet TAKE ONE TABLET BY MOUTH ONE TIME DAILY 30 tablet 4   vitamin E 400 UNIT capsule Take 400 Units by mouth daily.     zolpidem (AMBIEN) 5 MG tablet Take 1 tablet (5 mg total) by mouth at bedtime as needed. for sleep 30 tablet 3   No current facility-administered medications for this visit.    Allergies as of 12/12/2021 - Review Complete 11/08/2021  Allergen Reaction Noted   Celebrex [celecoxib]  11/24/2011   Darvon [propoxyphene]  11/24/2011   Duract [bromfenac]  11/24/2011   Nitrofuran derivatives  05/16/2017   Oxycodone-acetaminophen  11/11/2013   Prednisone  11/24/2011   Sulfa antibiotics  12/19/2014    Family History  Problem Relation Age of Onset   Fibromyalgia Mother    Colon polyps Mother    Diabetes Father    Thyroid cancer Father    Heart disease Father    Dementia Father    Colon polyps Father    Colon cancer Neg Hx    Esophageal cancer Neg Hx    Liver cancer Neg Hx    Pancreatic cancer Neg Hx    Stomach cancer Neg Hx    Rectal cancer Neg Hx     Social History   Socioeconomic History   Marital status: Married    Spouse name: Not on file   Number of children: 0   Years of education: 16   Highest education level: Not on file  Occupational History   Occupation: Futures trader    Comment: part time  Tobacco Use   Smoking status: Never   Smokeless tobacco: Never  Substance and Sexual Activity   Alcohol use: Yes    Comment: Consumes alcohol on occasion   Drug use: No   Sexual activity: Not on file  Other Topics Concern   Not on file  Social History Narrative   Lives at home w/ her husband and dog   Patient drinks 1  cup of tea a day.    Patient is right handed.   Social Determinants of Health   Financial Resource Strain: Not on file  Food Insecurity: Not on file  Transportation Needs: Not on file  Physical Activity: Not on file  Stress: Not on file  Social Connections: Not  on file  Intimate Partner Violence: Not on file    Review of Systems:    Constitutional: No weight loss, fever or chills Cardiovascular: No chest pain Respiratory: No SOB  Gastrointestinal: See HPI and otherwise negative   Physical Exam:  Vital signs: BP 116/70   Pulse 81   Ht '5\' 5"'$  (1.651 m)   Wt 143 lb (64.9 kg)   SpO2 97%   BMI 23.80 kg/m    Constitutional:   Pleasant Caucasian female appears to be in NAD, Well developed, Well nourished, alert and cooperative Respiratory: Respirations even and unlabored. Lungs clear to auscultation bilaterally.   No wheezes, crackles, or rhonchi.  Cardiovascular: Normal S1, S2. No MRG. Regular rate and rhythm. No peripheral edema, cyanosis or pallor.  Gastrointestinal:  Soft, nondistended, mild-mod rlq ttp,  No rebound or guarding. Normal bowel sounds. No appreciable masses or hepatomegaly. Rectal:  Not performed.  Psychiatric: Oriented to person, place and time. Demonstrates good judgement and reason without abnormal affect or behaviors.  RELEVANT LABS AND IMAGING: CBC    Component Value Date/Time   WBC 5.7 11/08/2021 1514   RBC 4.89 11/08/2021 1514   HGB 12.7 11/08/2021 1514   HCT 40.5 11/08/2021 1514   PLT 327 11/08/2021 1514   MCV 82.8 11/08/2021 1514   MCH 26.0 11/08/2021 1514   MCHC 31.4 11/08/2021 1514   RDW 14.1 11/08/2021 1514   LYMPHSABS 1.7 11/08/2021 1514   MONOABS 0.4 11/08/2021 1514   EOSABS 0.1 11/08/2021 1514   BASOSABS 0.0 11/08/2021 1514    CMP     Component Value Date/Time   NA 139 11/08/2021 1514   NA 144 09/12/2020 1051   K 3.8 11/08/2021 1514   CL 106 11/08/2021 1514   CO2 22 11/08/2021 1514   GLUCOSE 134 (H) 11/08/2021 1514   BUN 10  11/08/2021 1514   BUN 11 09/12/2020 1051   CREATININE 1.02 (H) 11/08/2021 1514   CALCIUM 9.2 11/08/2021 1514   PROT 6.9 11/08/2021 1514   PROT 6.9 09/12/2020 1051   ALBUMIN 4.2 11/08/2021 1514   ALBUMIN 4.8 09/12/2020 1051   AST 48 (H) 11/08/2021 1514   ALT 53 (H) 11/08/2021 1514   ALKPHOS 41 11/08/2021 1514   BILITOT 0.9 11/08/2021 1514   BILITOT 0.6 09/12/2020 1051   GFRNONAA >60 11/08/2021 1514   GFRAA >60 05/16/2017 0946    Assessment: 1.  Chronic right lower quadrant pain: Continues with episodes of right lower quadrant pain followed by diarrhea and mucousy stools, recent ER visit for these symptoms with normal CT and labs, this has been going on and off for about a year and a half or 2 now; consider IBD versus IBS 2.  GERD: Controlled on Pantoprazole 40 mg daily  Plan: 1.  Scheduled patient for a diagnostic colonoscopy in the [place] with Dr. Fuller Plan.  Did provide the patient a detailed list of risks for the procedure and she agrees to proceed. Patient is appropriate for endoscopic procedure(s) in the ambulatory ([place]) setting.  2.  Provided the patient in the copy of the low FODMAP diet 3.  Refilled patient's Pantoprazole 40 mg once daily, #90 with 3 refills. 4.  Patient to follow in clinic per recommendations after procedure with Dr. Fuller Plan.  Ellouise Newer, PA-C [facility] 12/12/2021, 10:29 AM  Cc: Bernerd Limbo, MD

## 2022-01-09 ENCOUNTER — Telehealth: Payer: Self-pay | Admitting: Gastroenterology

## 2022-01-09 NOTE — Telephone Encounter (Signed)
Inbound call from patient. Patient is scheduled for upcoming procedure 01/16/22 at 2:00 pm . Patient states she has started taking Cephalexin 500 mg taken twice a day. Patient states she is taking medication for an infection she has in her finger. Patient is also taking a probiotic over the counter as well. Please give a call to further advise.  Thank you

## 2022-01-09 NOTE — Telephone Encounter (Signed)
Advised patient it is ok to continue with both.

## 2022-01-16 ENCOUNTER — Ambulatory Visit (AMBULATORY_SURGERY_CENTER): Payer: PRIVATE HEALTH INSURANCE | Admitting: Gastroenterology

## 2022-01-16 ENCOUNTER — Encounter: Payer: Self-pay | Admitting: Gastroenterology

## 2022-01-16 VITALS — BP 125/53 | HR 73 | Temp 97.8°F | Resp 11 | Ht 65.0 in | Wt 143.0 lb

## 2022-01-16 DIAGNOSIS — Z860100 Personal history of colon polyps, unspecified: Secondary | ICD-10-CM

## 2022-01-16 DIAGNOSIS — K640 First degree hemorrhoids: Secondary | ICD-10-CM

## 2022-01-16 DIAGNOSIS — K635 Polyp of colon: Secondary | ICD-10-CM | POA: Diagnosis not present

## 2022-01-16 DIAGNOSIS — K5730 Diverticulosis of large intestine without perforation or abscess without bleeding: Secondary | ICD-10-CM

## 2022-01-16 DIAGNOSIS — D125 Benign neoplasm of sigmoid colon: Secondary | ICD-10-CM

## 2022-01-16 DIAGNOSIS — D123 Benign neoplasm of transverse colon: Secondary | ICD-10-CM

## 2022-01-16 DIAGNOSIS — R1031 Right lower quadrant pain: Secondary | ICD-10-CM | POA: Diagnosis present

## 2022-01-16 DIAGNOSIS — R197 Diarrhea, unspecified: Secondary | ICD-10-CM | POA: Diagnosis not present

## 2022-01-16 MED ORDER — DICYCLOMINE HCL 10 MG PO CAPS: 10.0000 mg | ORAL_CAPSULE | Freq: Two times a day (BID) | ORAL | 4 refills | Status: DC

## 2022-01-16 MED ORDER — SODIUM CHLORIDE 0.9 % IV SOLN: 500.0000 mL | Freq: Once | INTRAVENOUS | Status: DC

## 2022-01-16 NOTE — Progress Notes (Signed)
Called to room to assist during endoscopic procedure.  Patient ID and intended procedure confirmed with present staff. Received instructions for my participation in the procedure from the performing physician.  

## 2022-01-16 NOTE — Op Note (Signed)
[facility] Patient Name: Amanda Waters Procedure Date: 01/16/2022 1:59 PM MRN: [id number] Endoscopist: Ladene Artist , MD Age: 66 Referring MD:  Date of Birth: [birth year] Gender: Female Account #: [id number] Procedure:                Colonoscopy Indications:              Abdominal pain in the right lower quadrant,                            Clinically significant diarrhea of unexplained                            origin, Personal history of sessile serrated colon                            polyps Medicines:                Monitored Anesthesia Care Procedure:                Pre-Anesthesia Assessment:                           - Prior to the procedure, a History and Physical                            was performed, and patient medications and                            allergies were reviewed. The patient's tolerance of                            previous anesthesia was also reviewed. The risks                            and benefits of the procedure and the sedation                            options and risks were discussed with the patient.                            All questions were answered, and informed consent                            was obtained. Prior Anticoagulants: The patient has                            taken no previous anticoagulant or antiplatelet                            agents. ASA Grade Assessment: II - A patient with                            mild systemic disease. After reviewing the risks  and benefits, the patient was deemed in                            satisfactory condition to undergo the procedure.                           After obtaining informed consent, the colonoscope                            was passed under direct vision. Throughout the                            procedure, the patient's blood pressure, pulse, and                            oxygen saturations were monitored continuously. The                             CF [id number] #[id number] was introduced through the anus                            and advanced to the the cecum, identified by                            appendiceal orifice and ileocecal valve. The                            ileocecal valve, appendiceal orifice, and rectum                            were photographed. The quality of the bowel                            preparation was good. The colonoscopy was performed                            without difficulty. The patient tolerated the                            procedure well. Scope In: 2:11:56 PM Scope Out: 2:34:21 PM Scope Withdrawal Time: 0 hours 17 minutes 50 seconds  Total Procedure Duration: 0 hours 22 minutes 25 seconds  Findings:                 The perianal and digital rectal examinations were                            normal. Several attempts to enter the TI were not                            sucessful. Brief views of the TI at the IC valve                            appeared normal.  Two sessile polyps were found in the sigmoid colon                            and transverse colon. The polyps were 7 mm in size.                            These polyps were removed with a cold snare.                            Resection and retrieval were complete.                           There was a medium-sized lipoma, 18 mm in diameter,                            in the transverse colon.                           Multiple medium-mouthed diverticula were found in                            the left colon. There was no evidence of                            diverticular bleeding.                           Internal hemorrhoids were found during                            retroflexion. The hemorrhoids were small and Grade                            I (internal hemorrhoids that do not prolapse).                           The exam was otherwise without abnormality on                             direct and retroflexion views. Random biopsies                            obtained. Complications:            No immediate complications. Estimated blood loss:                            None. Estimated Blood Loss:     Estimated blood loss: none. Impression:               - Two 7 mm polyps in the sigmoid colon and in the                            transverse colon, removed with a cold snare.  Resected and retrieved.                           - Lipoma, transverse colon.                           - Mild diverticulosis in the left colon.                           - Internal hemorrhoids.                           - The examination was otherwise normal on direct                            and retroflexion views. Biopsied. Recommendation:           - Repeat colonoscopy after studies are complete for                            surveillance based on pathology results.                           - Patient has a contact number available for                            emergencies. The signs and symptoms of potential                            delayed complications were discussed with the                            patient. Return to normal activities tomorrow.                            Written discharge instructions were provided to the                            patient.                           - Resume previous diet.                           - Continue present medications.                           - Dicyclomine 10 mg po ac & hs, 1 year of refills.                           - Await pathology results.                           - Return to GI office in 2 months. Ladene Artist, MD 01/16/2022 2:43:56 PM This report has been signed electronically.

## 2022-01-16 NOTE — Progress Notes (Signed)
To pacu, VSS. Report to [email or web address] 

## 2022-01-16 NOTE — Progress Notes (Signed)
History & Physical  Primary Care Physician:  Bernerd Limbo, MD Primary Gastroenterologist: Lucio Edward, MD  CHIEF COMPLAINT:  Diarrhea, RLQ pain, Personal history of colon polyps   HPI: Amanda Waters is a 66 y.o. female with diarrhea, right lower quadrant pain, personal history of sessile serrated colon polyps for colonoscopy.   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    Hyperlipidemia    IBS (irritable bowel syndrome)    Infection due to non-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-producing Escherichia coli (E.coli)    Iron deficiency anemia    Kidney infection    Kidney stones    Left radial fracture    Radial head fracture   Memory difficulty 12/16/2013   Occipital neuralgia    Osteopenia    Sleep apnea    Swollen lymph nodes     Past Surgical History:  Procedure Laterality Date   Broken Elbow Bilateral    CHOLECYSTECTOMY     COLONOSCOPY     CYSTECTOMY     DILATION AND CURETTAGE OF UTERUS     hammartoe surgery Left    2nd toe   POLYPECTOMY     SHOULDER SURGERY     TONSILLECTOMY      Prior to Admission medications   Medication Sig Start Date End Date Taking? Authorizing Provider  Calcium Carbonate-Vitamin D 600-400 MG-UNIT chew tablet Chew 1 tablet by mouth daily.   Yes [provider]  famciclovir (FAMVIR) 500 MG tablet Take 500 mg by mouth daily.   Yes [provider]  famotidine (PEPCID) 20 MG tablet Take 20 mg by mouth at bedtime.   Yes [provider]  gabapentin (NEURONTIN) 600 MG tablet Take 1 tablet (600 mg total) by mouth 3 (three) times daily. 10/30/21  Yes Melvenia Beam, MD  Multiple  Vitamins-Minerals (CENTRUM SILVER ADULT 50+ PO) Take by mouth daily.   Yes [provider]  Na Sulfate-K Sulfate-Mg Sulf 17.5-3.13-1.6 GM/177ML SOLN Use as directed; may use generic; goodrx card if insurance will not cover generic 12/12/21  Yes Lemmon, Lavone Nian, PA  Nutritional Supplements (DHEA PO) Take 5 mg by mouth daily.   Yes [provider]  pantoprazole (PROTONIX) 40 MG tablet Take 1 tablet (40 mg total) by mouth daily. 12/12/21  Yes Levin Erp, PA  vitamin E 400 UNIT capsule Take 400 Units by mouth daily.   Yes [provider]  zolpidem (AMBIEN) 5 MG tablet Take 1 tablet (5 mg total) by mouth at bedtime as needed. for sleep 08/21/21  Yes Melvenia Beam, MD  cyclobenzaprine (FLEXERIL) 10 MG tablet Take 10 mg by mouth 3 (three) times daily as needed.    [provider]  HYDROcodone-acetaminophen (NORCO/VICODIN) 5-325 MG per tablet Take 1 tablet by mouth every 6 (six) hours as needed for pain.    [provider]  IMVEXXY MAINTENANCE PACK 10 MCG INST Place 1 capsule vaginally 2 (two) times a week. Patient not taking: Reported on 01/16/2022 06/11/19   [provider]    Current Outpatient Medications  Medication Sig Dispense  Refill   Calcium Carbonate-Vitamin D 600-400 MG-UNIT chew tablet Chew 1 tablet by mouth daily.     famciclovir (FAMVIR) 500 MG tablet Take 500 mg by mouth daily.     famotidine (PEPCID) 20 MG tablet Take 20 mg by mouth at bedtime.     gabapentin (NEURONTIN) 600 MG tablet Take 1 tablet (600 mg total) by mouth 3 (three) times daily. 270 tablet 1   Multiple Vitamins-Minerals (CENTRUM SILVER ADULT 50+ PO) Take by mouth daily.     Na Sulfate-K Sulfate-Mg Sulf 17.5-3.13-1.6 GM/177ML SOLN Use as directed; may use generic; goodrx card if insurance will not cover generic 354 mL 0   Nutritional Supplements (DHEA PO) Take 5 mg by mouth daily.     pantoprazole (PROTONIX) 40 MG tablet Take 1 tablet (40 mg total) by  mouth daily. 90 tablet 3   vitamin E 400 UNIT capsule Take 400 Units by mouth daily.     zolpidem (AMBIEN) 5 MG tablet Take 1 tablet (5 mg total) by mouth at bedtime as needed. for sleep 30 tablet 3   cyclobenzaprine (FLEXERIL) 10 MG tablet Take 10 mg by mouth 3 (three) times daily as needed.     HYDROcodone-acetaminophen (NORCO/VICODIN) 5-325 MG per tablet Take 1 tablet by mouth every 6 (six) hours as needed for pain.     IMVEXXY MAINTENANCE PACK 10 MCG INST Place 1 capsule vaginally 2 (two) times a week. (Patient not taking: Reported on 01/16/2022)     Current Facility-Administered Medications  Medication Dose Route Frequency Provider Last Rate Last Admin   0.9 %  sodium chloride infusion  500 mL Intravenous Once Ladene Artist, MD        Allergies as of 01/16/2022 - Review Complete 01/16/2022  Allergen Reaction Noted   Celebrex [celecoxib]  11/24/2011   Darvon [propoxyphene]  11/24/2011   Duract [bromfenac]  11/24/2011   Nitrofuran derivatives  05/16/2017   Oxycodone-acetaminophen  11/11/2013   Prednisone  11/24/2011   Sulfa antibiotics  12/19/2014    Family History  Problem Relation Age of Onset   Fibromyalgia Mother    Colon polyps Mother    Diabetes Father    Thyroid cancer Father    Heart disease Father    Dementia Father    Colon polyps Father    Colon cancer Neg Hx    Esophageal cancer Neg Hx    Liver cancer Neg Hx    Pancreatic cancer Neg Hx    Stomach cancer Neg Hx    Rectal cancer Neg Hx     Social History   Socioeconomic History   Marital status: Married    Spouse name: Not on file   Number of children: 0   Years of education: 16   Highest education level: Not on file  Occupational History   Occupation: Futures trader    Comment: part time  Tobacco Use   Smoking status: Never   Smokeless tobacco: Never  Substance and Sexual Activity   Alcohol use: Yes    Comment: Consumes alcohol on occasion   Drug use: No   Sexual activity: Not on file   Other Topics Concern   Not on file  Social History Narrative   Lives at home w/ her husband and dog   Patient drinks 1 cup of tea a day.    Patient is right handed.   Social Determinants of Health   Financial Resource Strain: Not on file  Food Insecurity: Not on file  Transportation Needs: Not  on file  Physical Activity: Not on file  Stress: Not on file  Social Connections: Not on file  Intimate Partner Violence: Not on file    Review of Systems:  All systems reviewed were negative except where noted in HPI.   Physical Exam: General:  Alert, well-developed, in NAD Head:  Normocephalic and atraumatic. Eyes:  Sclera clear, no icterus.   Conjunctiva pink. Ears:  Normal auditory acuity. Mouth:  No deformity or lesions.  Neck:  Supple; no masses . Lungs:  Clear throughout to auscultation.   No wheezes, crackles, or rhonchi. No acute distress. Heart:  Regular rate and rhythm; no murmurs. Abdomen:  Soft, nondistended, nontender. No masses, hepatomegaly. No obvious masses.  Normal bowel .    Rectal:  Deferred   Msk:  Symmetrical without gross deformities.. Pulses:  Normal pulses noted. Extremities:  Without edema. Neurologic:  Alert and  oriented x4;  grossly normal neurologically. Skin:  Intact without significant lesions or rashes. Cervical Nodes:  No significant cervical adenopathy. Psych:  Alert and cooperative. Normal mood and affect.   Impression / Plan:   Diarrhea, right lower quadrant pain, personal history of sessile serrated colon polyps for colonoscopy.  Pricilla Riffle. Fuller Plan  01/16/2022, 2:03 PM See Shea Evans, [facility], to contact our on call provider

## 2022-01-16 NOTE — Patient Instructions (Signed)
Resume previous diet and medications. Take Dicyclomine 10 MG by mouth before breakfast and dinner. (1 year of refills sent) Awaiting pathology results. Return to GI office in 2 months  YOU HAD AN ENDOSCOPIC PROCEDURE TODAY AT [facility]:   Refer to the procedure report that was given to you for any specific questions about what was found during the examination.  If the procedure report does not answer your questions, please call your gastroenterologist to clarify.  If you requested that your care partner not be given the details of your procedure findings, then the procedure report has been included in a sealed envelope for you to review at your convenience later.  YOU SHOULD EXPECT: Some feelings of bloating in the abdomen. Passage of more gas than usual.  Walking can help get rid of the air that was put into your GI tract during the procedure and reduce the bloating. If you had a lower endoscopy (such as a colonoscopy or flexible sigmoidoscopy) you may notice spotting of blood in your stool or on the toilet paper. If you underwent a bowel prep for your procedure, you may not have a normal bowel movement for a few days.  Please Note:  You might notice some irritation and congestion in your nose or some drainage.  This is from the oxygen used during your procedure.  There is no need for concern and it should clear up in a day or so.  SYMPTOMS TO REPORT IMMEDIATELY:  Following lower endoscopy (colonoscopy or flexible sigmoidoscopy):  Excessive amounts of blood in the stool  Significant tenderness or worsening of abdominal pains  Swelling of the abdomen that is new, acute  Fever of 100F or higher  For urgent or emergent issues, a gastroenterologist can be reached at any hour by calling [phone]. Do not use MyChart messaging for urgent concerns.    DIET:  We do recommend a small meal at first, but then you may proceed to your regular diet.  Drink plenty of fluids but you  should avoid alcoholic beverages for 24 hours.  ACTIVITY:  You should plan to take it easy for the rest of today and you should NOT DRIVE or use heavy machinery until tomorrow (because of the sedation medicines used during the test).    FOLLOW UP: Our staff will call the number listed on your records the next business day following your procedure.  We will call around 7:15- 8:00 am to check on you and address any questions or concerns that you may have regarding the information given to you following your procedure. If we do not reach you, we will leave a message.  If you develop any symptoms (ie: fever, flu-like symptoms, shortness of breath, cough etc.) before then, please call [phone].  If you test positive for Covid 19 in the 2 weeks post procedure, please call and report this information to [place].    If any biopsies were taken you will be contacted by phone or by letter within the next 1-3 weeks.  Please call us at [phone] if you have not heard about the biopsies in 3 weeks.    SIGNATURES/CONFIDENTIALITY: You and/or your care partner have signed paperwork which will be entered into your electronic medical record.  These signatures attest to the fact that that the information above on your After Visit Summary has been reviewed and is understood.  Full responsibility of the confidentiality of this discharge information lies with you and/or your care-partner.

## 2022-01-17 ENCOUNTER — Telehealth: Payer: Self-pay

## 2022-01-17 NOTE — Telephone Encounter (Signed)
  Follow up Call-     01/16/2022    1:09 PM  Call back number  Post procedure Call Back phone  # [phone]  Permission to leave phone message Yes     Patient questions:  Do you have a fever, pain , or abdominal swelling? No. Pain Score  0 *  Have you tolerated food without any problems? Yes.    Have you been able to return to your normal activities? Yes.    Do you have any questions about your discharge instructions: Diet   No. Medications  No. Follow up visit  No.  Do you have questions or concerns about your Care? No.  Actions: * If pain score is 4 or above: No action needed, pain <4.

## 2022-01-24 ENCOUNTER — Encounter: Payer: Self-pay | Admitting: Gastroenterology

## 2022-02-27 ENCOUNTER — Ambulatory Visit: Payer: PRIVATE HEALTH INSURANCE | Admitting: Neurology

## 2022-03-20 ENCOUNTER — Ambulatory Visit: Payer: PRIVATE HEALTH INSURANCE | Admitting: Gastroenterology

## 2022-04-04 ENCOUNTER — Ambulatory Visit: Payer: PRIVATE HEALTH INSURANCE | Admitting: Neurology

## 2022-10-31 ENCOUNTER — Other Ambulatory Visit: Payer: Self-pay | Admitting: Obstetrics and Gynecology

## 2022-10-31 DIAGNOSIS — E2839 Other primary ovarian failure: Secondary | ICD-10-CM

## 2022-11-29 ENCOUNTER — Other Ambulatory Visit: Payer: Self-pay | Admitting: Physician Assistant

## 2023-03-04 ENCOUNTER — Other Ambulatory Visit: Payer: Self-pay | Admitting: Physician Assistant

## 2023-06-08 ENCOUNTER — Other Ambulatory Visit: Payer: Self-pay | Admitting: Physician Assistant

## 2023-06-10 MED ORDER — PANTOPRAZOLE SODIUM 40 MG PO TBEC: 40.0000 mg | DELAYED_RELEASE_TABLET | Freq: Every day | ORAL | 0 refills | Status: DC

## 2023-06-10 NOTE — Telephone Encounter (Signed)
Sent 90 day supply for Pantoprazole to [facility].

## 2023-06-10 NOTE — Telephone Encounter (Signed)
PT is requesting a refill for pantoprazole sent to [facility] at [facility]. She has scheduled a FU for 3/5 to be able to continue this medication. Requesting a call back

## 2023-07-23 ENCOUNTER — Encounter: Payer: Self-pay | Admitting: Physician Assistant

## 2023-07-23 ENCOUNTER — Ambulatory Visit (INDEPENDENT_AMBULATORY_CARE_PROVIDER_SITE_OTHER): Payer: No Typology Code available for payment source | Admitting: Physician Assistant

## 2023-07-23 VITALS — BP 104/62 | HR 98 | Ht 65.0 in | Wt 145.5 lb

## 2023-07-23 DIAGNOSIS — Z8719 Personal history of other diseases of the digestive system: Secondary | ICD-10-CM | POA: Diagnosis not present

## 2023-07-23 DIAGNOSIS — R1032 Left lower quadrant pain: Secondary | ICD-10-CM | POA: Diagnosis not present

## 2023-07-23 DIAGNOSIS — K5900 Constipation, unspecified: Secondary | ICD-10-CM

## 2023-07-23 MED ORDER — HYOSCYAMINE SULFATE 0.125 MG SL SUBL
0.1250 mg | SUBLINGUAL_TABLET | SUBLINGUAL | 5 refills | Status: DC | PRN
Start: 2023-07-23 — End: 2023-12-03

## 2023-07-23 NOTE — Patient Instructions (Signed)
 We have sent the following medications to your pharmacy for you to pick up at your convenience: Hyoscyamine 0.125mg  every 4-6 hours as needed for pain.  Increase Miralax to daily.   Thank you for trusting me with your gastrointestinal care!  Hyacinth Meeker, PA-C  _______________________________________________________  If your blood pressure at your visit was 140/90 or greater, please contact your primary care physician to follow up on this.  _______________________________________________________  If you are age 19 or older, your body mass index should be between 23-30. Your Body mass index is 24.21 kg/m. If this is out of the aforementioned range listed, please consider follow up with your Primary Care Provider.  If you are age 34 or younger, your body mass index should be between 19-25. Your Body mass index is 24.21 kg/m. If this is out of the aformentioned range listed, please consider follow up with your Primary Care Provider.   ________________________________________________________  The [facility] providers would like to encourage you to use [facility] to communicate with providers for non-urgent requests or questions.  Due to long hold times on the telephone, sending your provider a message by [facility] may be a faster and more efficient way to get a response.  Please allow 48 business hours for a response.  Please remember that this is for non-urgent requests.  _______________________________________________________

## 2023-07-23 NOTE — Progress Notes (Signed)
 Chief Complaint: Follow-up chronic right lower quadrant pain  HPI:    Amanda Waters is a 68 year old female with a past medical history as listed below including fibromyalgia, reflux, IBS and multiple others, known to Dr. Russella Waters previously, who presents to clinic today for follow-up of chronic right lower quadrant pain.    11/13/2017 colonoscopy with three 6-7 mm polyps in the descending and transverse colon as well as hepatic flexure, mild diverticulosis in left colon otherwise normal.  Pathology showed sessile serrated polyps without dysplasia and repeat was recommended in 5 years.  EGD on the same day with a small hiatal hernia and multiple gastric polyps and nonbleeding erosive gastropathy.  Labs returned grossly normal.  Hemoccult studies were negative.  She never had her CT done.    10/18/2020 patient seen in clinic for right lower quadrant pain.  At that time repeated CBC, iron studies and B12 and ordered Hemoccult testing.  Also ordered noncontrast CT of the abdomen pelvis for further evaluation.    11/08/2021 ER visit for right lower quadrant pain.  At that time discussed abdominal pain which is started 5 days before and felt like cramping worse with urination.  Labs showed minimal elevation in LFTs AST 48 and ALT 53, creatinine minimally elevated at 1.02.  Urinalysis normal CBC normal.  CT of the abdomen pelvis with diverticulosis and otherwise normal.    12/12/2021 office with me to discuss episodes of right lower quadrant pain.  She remained on Pantoprazole 40 mg daily which controlled her reflux.  Patient was scheduled for diagnostic colonoscopy.  Provided a copy low FODMAP diet and refill Pantoprazole 40 daily.    01/16/2022 colonoscopy with two 7 mm polyps in the sigmoid colon and transverse colon, lipoma in the transverse colon, mild diverticulosis in the left colon internal hemorrhoids.  Patient told to continue Dicyclomine 10 mg p.o. before meals and at bedtime.  Pathology showed polypoid mucosa.   Repeat colonoscopy recommended in 7 years.    10/15/2022 CT abdomen pelvis with IV contrast showed descending colonic acute diverticulitis.    04/04/2023 CBC normal.    05/29/2023 patient went and saw PCP and diagnosed with diverticulitis.  She was given Augmentin for 10 days.    Today, the patient presents to clinic and tells me that she has had 2 flares of diverticulitis over the past year.  The first in May as above and then given empiric treatment back in January.  She asked me some questions about this and what she can do in the future for it.  She does continue with some left lower quadrant discomfort sometimes a 3 out of 10 but some days she does not feel at all.  It is worse when she has not had a bowel movement.  She has started using MiraLAX every other day which helps to keep her more regular.    Denies fever, chills, blood in her stool, weight loss, nausea or vomiting.  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    Hyperlipidemia    IBS (irritable bowel syndrome)  Infection due to non-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-producing Escherichia coli (E.coli)    Iron deficiency anemia    Kidney infection    Kidney stones    Left radial fracture    Radial head fracture   Memory difficulty 12/16/2013   Occipital neuralgia    Osteopenia    Sleep apnea    Swollen lymph nodes     Past Surgical History:  Procedure Laterality Date   Broken Elbow Bilateral    CHOLECYSTECTOMY     COLONOSCOPY     CYSTECTOMY     DILATION AND CURETTAGE OF UTERUS     hammartoe surgery Left    2nd toe   POLYPECTOMY     SHOULDER SURGERY     TONSILLECTOMY      Current  Outpatient Medications  Medication Sig Dispense Refill   Calcium Carbonate-Vitamin D 600-400 MG-UNIT chew tablet Chew 1 tablet by mouth daily.     cyclobenzaprine (FLEXERIL) 10 MG tablet Take 10 mg by mouth 3 (three) times daily as needed.     dicyclomine (BENTYL) 10 MG capsule Take 1 capsule (10 mg total) by mouth in the morning and at bedtime. 90 capsule 4   famciclovir (FAMVIR) 500 MG tablet Take 500 mg by mouth daily.     famotidine (PEPCID) 20 MG tablet Take 20 mg by mouth at bedtime.     gabapentin (NEURONTIN) 600 MG tablet Take 1 tablet (600 mg total) by mouth 3 (three) times daily. 270 tablet 1   HYDROcodone-acetaminophen (NORCO/VICODIN) 5-325 MG per tablet Take 1 tablet by mouth every 6 (six) hours as needed for pain.     IMVEXXY MAINTENANCE PACK 10 MCG INST Place 1 capsule vaginally 2 (two) times a week. (Patient not taking: Reported on 01/16/2022)     Multiple Vitamins-Minerals (CENTRUM SILVER ADULT 50+ PO) Take by mouth daily.     Na Sulfate-K Sulfate-Mg Sulf 17.5-3.13-1.6 GM/177ML SOLN Use as directed; may use generic; goodrx card if insurance will not cover generic 354 mL 0   Nutritional Supplements (DHEA PO) Take 5 mg by mouth daily.     pantoprazole (PROTONIX) 40 MG tablet Take 1 tablet (40 mg total) by mouth daily. 90 tablet 0   vitamin E 400 UNIT capsule Take 400 Units by mouth daily.     zolpidem (AMBIEN) 5 MG tablet Take 1 tablet (5 mg total) by mouth at bedtime as needed. for sleep 30 tablet 3   No current facility-administered medications for this visit.    Allergies as of 07/23/2023 - Review Complete 01/16/2022  Allergen Reaction Noted   Celebrex [celecoxib]  11/24/2011   Darvon [propoxyphene]  11/24/2011   Duract [bromfenac]  11/24/2011   Nitrofuran derivatives  05/16/2017   Oxycodone-acetaminophen  11/11/2013   Prednisone  11/24/2011   Sulfa antibiotics  12/19/2014    Family History  Problem Relation Age of Onset   Fibromyalgia Mother    Colon polyps Mother     Diabetes Father    Thyroid cancer Father    Heart disease Father    Dementia Father    Colon polyps Father    Colon cancer Neg Hx    Esophageal cancer Neg Hx    Liver cancer Neg Hx    Pancreatic cancer Neg Hx    Stomach cancer Neg Hx    Rectal cancer Neg Hx     Social History   Socioeconomic History   Marital status: Married    Spouse name: Not on file   Number of children: 0  Years of education: 66   Highest education level: Not on file  Occupational History   Occupation: Network engineer    Comment: part time  Tobacco Use   Smoking status: Never   Smokeless tobacco: Never  Substance and Sexual Activity   Alcohol use: Yes    Comment: Consumes alcohol on occasion   Drug use: No   Sexual activity: Not on file  Other Topics Concern   Not on file  Social History Narrative   Lives at home w/ her husband and dog   Patient drinks 1 cup of tea a day.    Patient is right handed.   Social Drivers of Corporate investment banker Strain: Low Risk  (06/25/2023)   Received from [facility]   Overall Financial Resource Strain (CARDIA)    Difficulty of Paying Living Expenses: Not hard at all  Food Insecurity: No Food Insecurity (06/25/2023)   Received from [facility]   Hunger Vital Sign    Worried About Running Out of Food in the Last Year: Never true    Ran Out of Food in the Last Year: Never true  Transportation Needs: No Transportation Needs (06/25/2023)   Received from [facility] - Transportation    Lack of Transportation (Medical): No    Lack of Transportation (Non-Medical): No  Physical Activity: Inactive (06/25/2023)   Received from [facility]   Exercise Vital Sign    Days of Exercise per Week: 0 days    Minutes of Exercise per Session: 50 min  Stress: No Stress Concern Present (06/25/2023)   Received from [facility] - Occupational Stress Questionnaire    Feeling of Stress : Only a little  Social  Connections: Socially Integrated (06/25/2023)   Received from [facility]   Social Network    How would you rate your social network (family, work, friends)?: Good participation with social networks  Intimate Partner Violence: Not At Risk (06/25/2023)   Received from [facility]   HITS    Over the last 12 months how often did your partner physically hurt you?: Never    Over the last 12 months how often did your partner insult you or talk down to you?: Never    Over the last 12 months how often did your partner threaten you with physical harm?: Never    Over the last 12 months how often did your partner scream or curse at you?: Never    Review of Systems:    Constitutional: No weight loss, fever or chills Cardiovascular: No chest pain   Respiratory: No SOB  Gastrointestinal: See HPI and otherwise negative   Physical Exam:  Vital signs: BP 104/62   Pulse 98   Ht 5\' 5"  (1.651 m)   Wt 145 lb 8 oz (66 kg)   SpO2 98%   BMI 24.21 kg/m    Constitutional:   Pleasant Caucasian female appears to be in NAD, Well developed, Well nourished, alert and cooperative Respiratory: Respirations even and unlabored. Lungs clear to auscultation bilaterally.   No wheezes, crackles, or rhonchi.  Cardiovascular: Normal S1, S2. No MRG. Regular rate and rhythm. No peripheral edema, cyanosis or pallor.  Gastrointestinal:  Soft, nondistended, nontender. No rebound or guarding. Normal bowel sounds. No appreciable masses or hepatomegaly. Rectal:  Not performed.  Psychiatric: Oriented to person, place and time. Demonstrates good judgement and reason without abnormal affect or behaviors.  RELEVANT LABS AND IMAGING: CBC  Component Value Date/Time   WBC 5.7 11/08/2021 1514   RBC 4.89 11/08/2021 1514   HGB 12.7 11/08/2021 1514   HCT 40.5 11/08/2021 1514   PLT 327 11/08/2021 1514   MCV 82.8 11/08/2021 1514   MCH 26.0 11/08/2021 1514   MCHC 31.4 11/08/2021 1514   RDW 14.1 11/08/2021 1514   LYMPHSABS 1.7  11/08/2021 1514   MONOABS 0.4 11/08/2021 1514   EOSABS 0.1 11/08/2021 1514   BASOSABS 0.0 11/08/2021 1514    CMP     Component Value Date/Time   NA 139 11/08/2021 1514   NA 144 09/12/2020 1051   K 3.8 11/08/2021 1514   CL 106 11/08/2021 1514   CO2 22 11/08/2021 1514   GLUCOSE 134 (H) 11/08/2021 1514   BUN 10 11/08/2021 1514   BUN 11 09/12/2020 1051   CREATININE 1.02 (H) 11/08/2021 1514   CALCIUM 9.2 11/08/2021 1514   PROT 6.9 11/08/2021 1514   PROT 6.9 09/12/2020 1051   ALBUMIN 4.2 11/08/2021 1514   ALBUMIN 4.8 09/12/2020 1051   AST 48 (H) 11/08/2021 1514   ALT 53 (H) 11/08/2021 1514   ALKPHOS 41 11/08/2021 1514   BILITOT 0.9 11/08/2021 1514   BILITOT 0.6 09/12/2020 1051   GFRNONAA >60 11/08/2021 1514   GFRAA >60 05/16/2017 0946    Assessment: 1.  History of diverticulitis: Treated x 2 over the past year, the first was diagnosed via CT 2.  Left lower quadrant pain: With history above and below 3.  Constipation: Likely contributing to ongoing left lower quadrant discomfort; likely diet related  Plan: 1.  Discussed the pathophysiology of diverticulitis.  Typical recommendations are for a high-fiber diet when not in a flare and a clear liquid diet when she develops pain.  She is currently in an Alzheimer's research study and on a heart healthy/Mediterranean diet and very strict with all of her intake.  She will try to increase fiber to 25 to 35 g/day. 2.  Would increase MiraLAX to daily to see how if this helps with the pain that she develops in between bowel movements.  Discussed titration of this. 3.  Prescribed Hyoscyamine sulfate 0.125 mg sublingual tabs, 1 tab every 4-6 hours as needed for occasional left lower quadrant discomfort.  #30 with 5 refills. 4.  Patient wished to establish with Dr. Rhea Belton now that Dr. Russella Waters is retired.  She will follow in clinic with [place] as needed.  Hyacinth Meeker, PA-C [facility] 07/23/2023, 2:06 PM  Cc: Tracey Harries, MD

## 2023-07-24 NOTE — Progress Notes (Signed)
 Addendum: Reviewed and agree with assessment and management plan. Asha Grumbine, Carie Caddy, MD

## 2023-09-14 ENCOUNTER — Other Ambulatory Visit: Payer: Self-pay | Admitting: Physician Assistant

## 2023-09-26 IMAGING — CT CT ABD-PELV W/ CM
2 of 5 series · 16 of 46 positions shown, 18 images · IV contrast (APPLIED)
Comparison: 12/16/2014

CLINICAL DATA: Right lower quadrant pain for 2 days, initial
encounter

EXAM:
CT ABDOMEN AND PELVIS WITH CONTRAST
TECHNIQUE: Multidetector CT imaging of the abdomen and pelvis was performed
using the standard protocol following bolus administration of
intravenous contrast.

[Series 3: abd/ pelvis 5.0 i30f 2 · axial · 0.97mm/px · z∈[+664,+1054]mm · 13 of 88 slices shown, 15 images]
[im 5/88  soft-tissue]
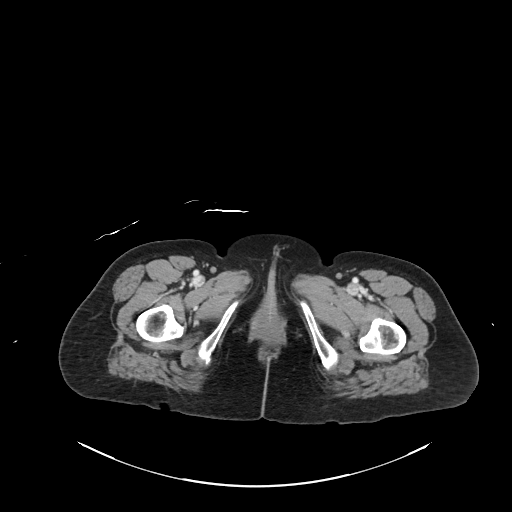
[im 5/88  bone]
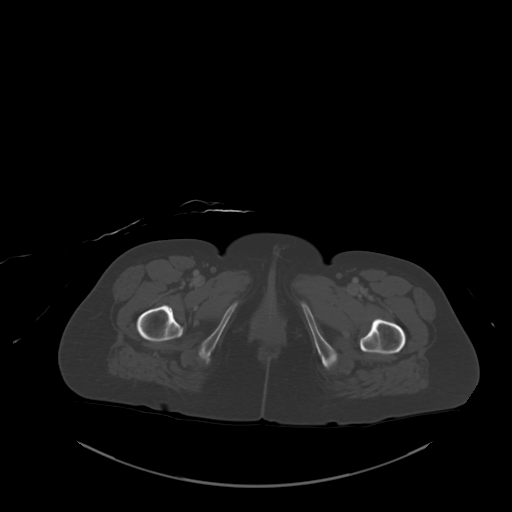
[im 14/88  soft-tissue]
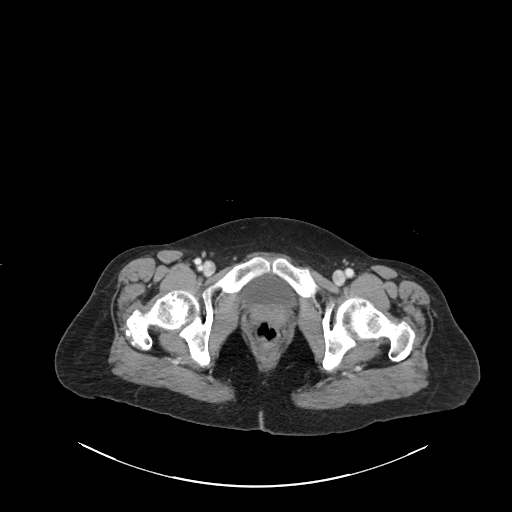
[im 18/88  soft-tissue]
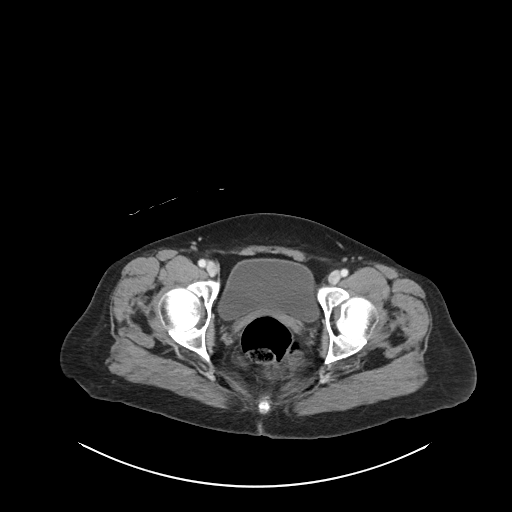
[im 27/88  soft-tissue]
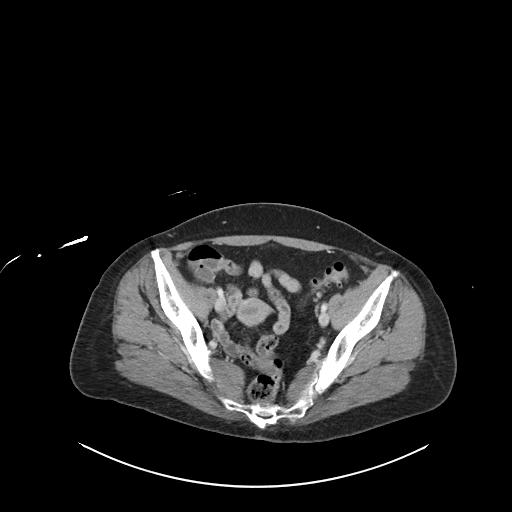
[im 31/88  soft-tissue]
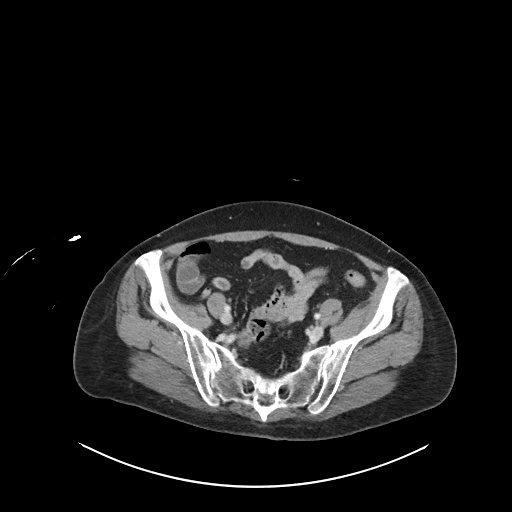
[im 40/88  soft-tissue]
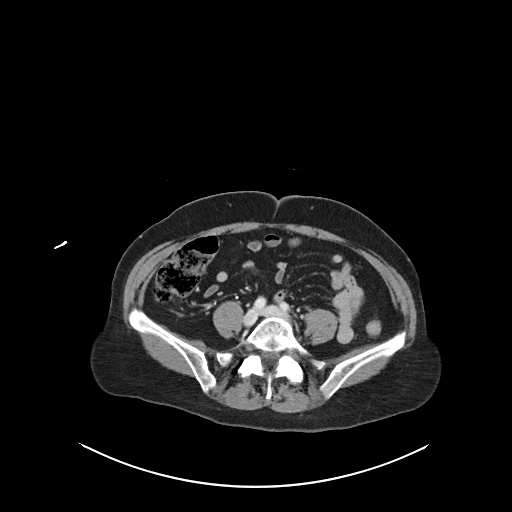
[im 44/88  soft-tissue]
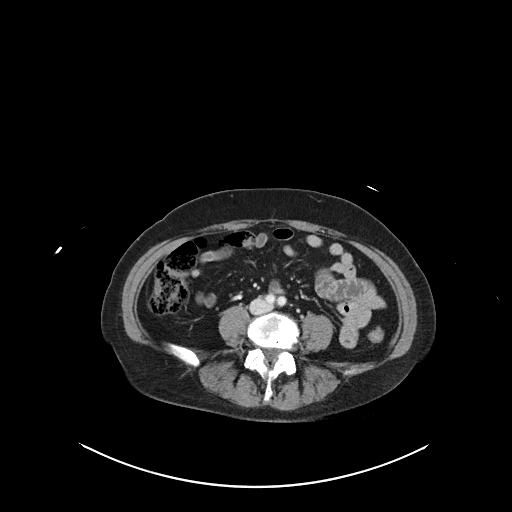
[im 48/88  soft-tissue]
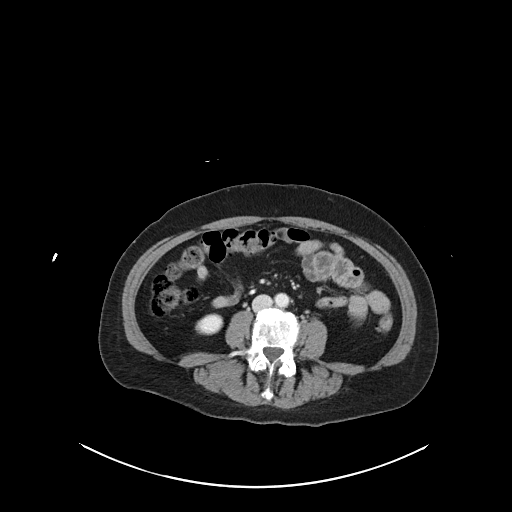
[im 57/88  soft-tissue]
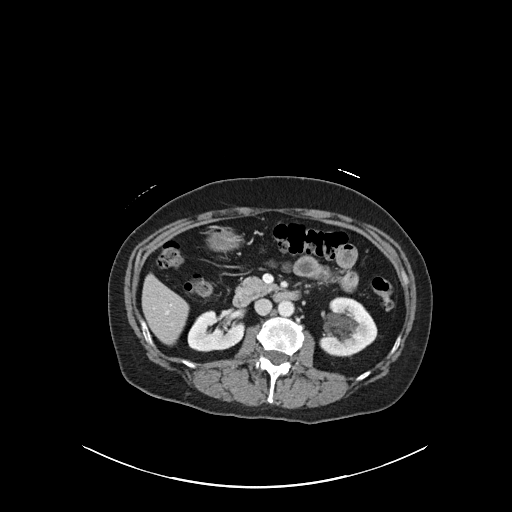
[im 57/88  bone]
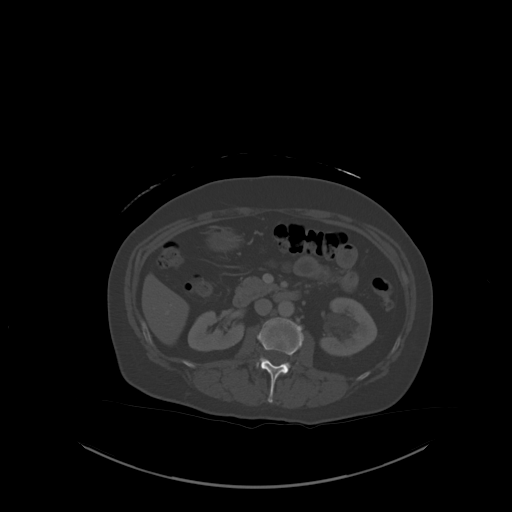
[im 61/88  soft-tissue]
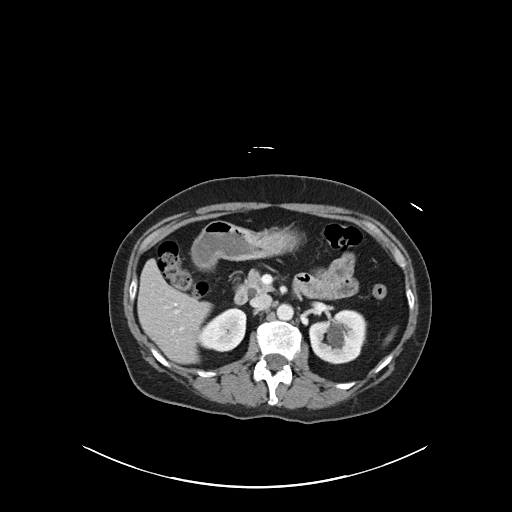
[im 70/88  soft-tissue]
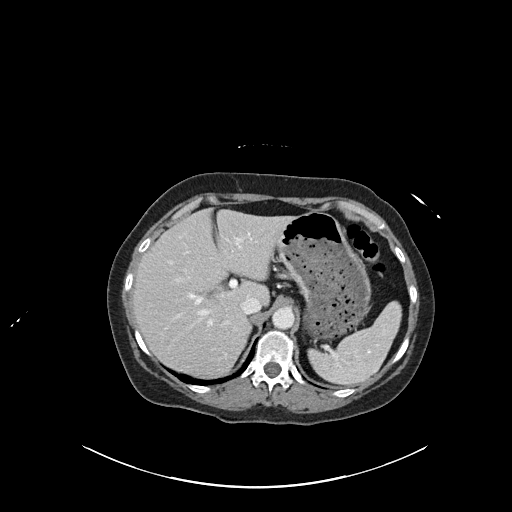
[im 74/88  soft-tissue]
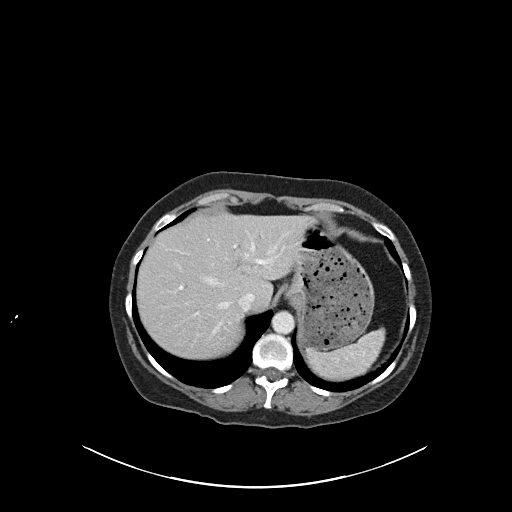
[im 83/88  soft-tissue]
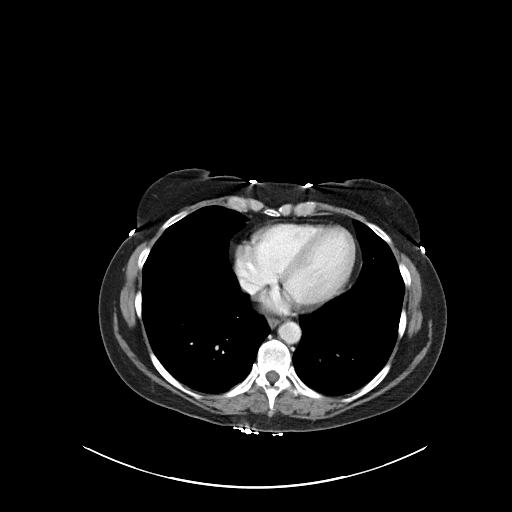

[Series 6: coronal soft tissue · coronal · 0.69mm/px · 3 of 101 slices shown]
[im 34/101  soft-tissue]
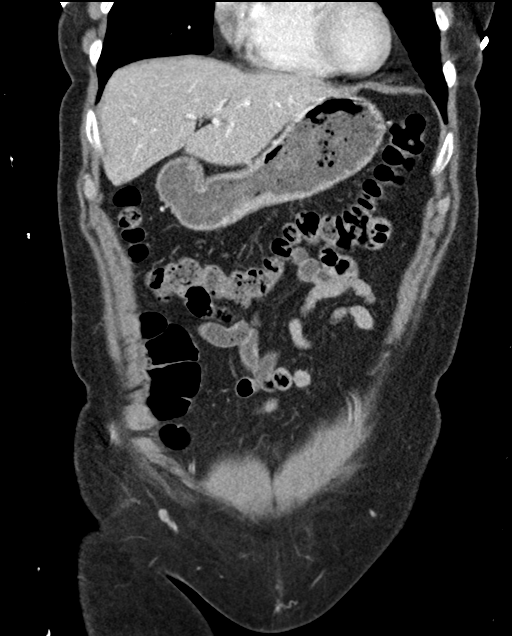
[im 45/101  soft-tissue]
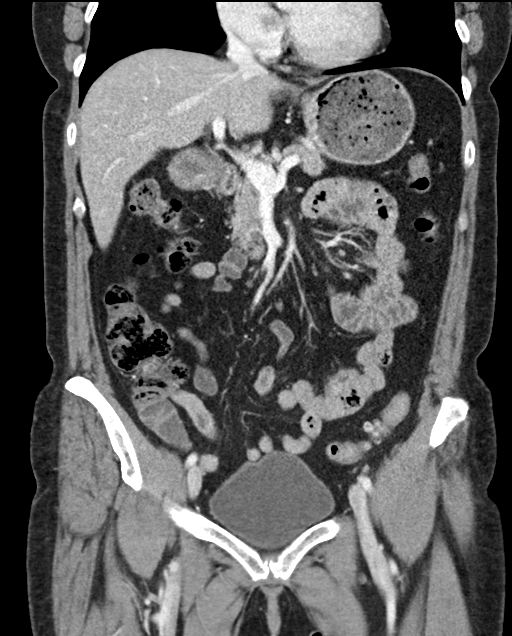
[im 56/101  soft-tissue]
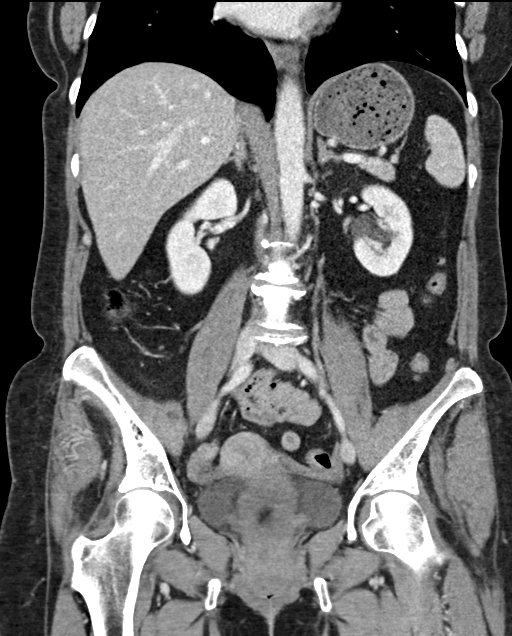

[16 of 46 positions shown; findings below may reference images not displayed]

RADIATION DOSE REDUCTION: This exam was performed according to the
departmental dose-optimization program which includes automated
exposure control, adjustment of the mA and/or kV according to
patient size and/or use of iterative reconstruction technique.

CONTRAST:  100mL OMNIPAQUE IOHEXOL 300 MG/ML  SOLN
FINDINGS: Lower chest: No acute abnormality.

Hepatobiliary: No focal liver abnormality is seen. Status post
cholecystectomy. No biliary dilatation.

Pancreas: Unremarkable. No pancreatic ductal dilatation or
surrounding inflammatory changes.

Spleen: Normal in size without focal abnormality.

Adrenals/Urinary Tract: Adrenal glands are within normal limits.
Right kidney demonstrates a normal enhancement pattern. Left kidney
shows peripelvic cysts stable in appearance from the prior exam. No
follow-up is recommended. No ureteral calculi or obstructive changes
are seen. The bladder is within normal limits.

Stomach/Bowel: Mild diverticular change of the colon is noted
without evidence of diverticulitis. The appendix is within normal
limits. Small bowel and stomach are unremarkable.

Vascular/Lymphatic: Aortic atherosclerosis. No enlarged abdominal or
pelvic lymph nodes.

Reproductive: Uterus and bilateral adnexa are unremarkable.

Other: No abdominal wall hernia or abnormality. No abdominopelvic
ascites.

Musculoskeletal: Degenerative changes of the lumbar spine are noted.
IMPRESSION: Diverticulosis without diverticulitis.

No other focal abnormality is noted.

## 2023-11-22 ENCOUNTER — Other Ambulatory Visit: Payer: Self-pay | Admitting: Physician Assistant

## 2023-12-03 ENCOUNTER — Ambulatory Visit

## 2023-12-03 ENCOUNTER — Encounter: Payer: Self-pay | Admitting: Internal Medicine

## 2023-12-03 ENCOUNTER — Ambulatory Visit: Attending: Internal Medicine | Admitting: Internal Medicine

## 2023-12-03 VITALS — BP 119/79 | HR 76 | Ht 65.0 in | Wt 144.8 lb

## 2023-12-03 DIAGNOSIS — Z79899 Other long term (current) drug therapy: Secondary | ICD-10-CM | POA: Insufficient documentation

## 2023-12-03 DIAGNOSIS — R079 Chest pain, unspecified: Secondary | ICD-10-CM | POA: Insufficient documentation

## 2023-12-03 DIAGNOSIS — R002 Palpitations: Secondary | ICD-10-CM

## 2023-12-03 DIAGNOSIS — E785 Hyperlipidemia, unspecified: Secondary | ICD-10-CM | POA: Insufficient documentation

## 2023-12-03 MED ORDER — EZETIMIBE 10 MG PO TABS: 10.0000 mg | ORAL_TABLET | Freq: Every day | ORAL | 3 refills | Status: AC

## 2023-12-03 NOTE — Progress Notes (Unsigned)
 Enrolled for [employer] to mail a ZIO XT long term holter monitor to the patients address on file.

## 2023-12-03 NOTE — Patient Instructions (Addendum)
 Medication Instructions:  Start Zetia  10 mg a day. Continue all other medications as listed.  *If you need a refill on your cardiac medications before your next appointment, please call your pharmacy*  Lab Work: Please have blood work in 3 months (Lipid/ALT)  If you have labs (blood work) drawn today and your tests are completely normal, you will receive your results only by: MyChart Message (if you have MyChart) OR A paper copy in the mail If you have any lab test that is abnormal or we need to change your treatment, we will call you to review the results.  Testing/Procedures: You are scheduled for a Myocardial Perfusion Imaging Study on        at        .   Please arrive 15 minutes prior to your appointment time for registration and insurance purposes.   The test will take approximately 3 to 4 hours to complete; you may bring reading material. If someone comes with you to your appointment, they will need to remain in the main lobby due to limited space in the testing area.   If you are pregnant or breastfeeding, please notify the nuclear lab prior to your appointment.   How to prepare for your Myocardial Perfusion test:   Do not eat or drink 3 hours prior to your test, except you may have water.    Do not consume products containing caffeine (regular or decaffeinated) 12 hours prior to your test (ex: coffee, chocolate, soda, tea)   Do bring a list of your current medications with you. If not listed below, you may take your medications as normal.   Bring any held medication to your appointment, as you may be required to take it once the test is complete.   Do wear comfortable clothes (no dresses or overalls) and walking shoes. Tennis shoes are preferred. No heels or open toed shoes.  Do not wear cologne, perfume, aftershave or lotions (deodorant is allowed).   If these instructions are not followed, you test will have to be rescheduled.   Please report to [address],  [place] for your test. If you have questions or concerns about your appointment, please call the Nuclear Lab at #[phone].  If you cannot keep your appointment, please provide 24 hour notification to the Nuclear lab to avoid a possible $50 charge to your account.    ZIO XT- Long Term Monitor Instructions  Your physician has requested you wear a ZIO patch monitor for 14 days.  This is a single patch monitor. [employer] supplies one patch monitor per enrollment. Additional stickers are not available. Please do not apply patch if you will be having a Nuclear Stress Test,  Echocardiogram, Cardiac CT, MRI, or Chest Xray during the period you would be wearing the  monitor. The patch cannot be worn during these tests. You cannot remove and re-apply the  ZIO XT patch monitor.  Your ZIO patch monitor will be mailed 3 day USPS to your address on file. It may take 3-5 days  to receive your monitor after you have been enrolled.  Once you have received your monitor, please review the enclosed instructions. Your monitor  has already been registered assigning a specific monitor serial # to you.  Billing and Patient Assistance Program Information  We have supplied [employer] with any of your insurance information on file for billing purposes. [employer] offers a sliding scale Patient Assistance Program for patients that do not have  insurance, or whose insurance  does not completely cover the cost of the ZIO monitor.  You must apply for the Patient Assistance Program to qualify for this discounted rate.  To apply, please call [employer] at [phone], select option 4, select option 2, ask to apply for  Patient Assistance Program. Meredeth will ask your household income, and how many people  are in your household. They will quote your out-of-pocket cost based on that information.  [employer] will also be able to set up a 46-month, interest-free payment plan if needed.  Applying the monitor   Shave hair from  upper left chest.  Hold abrader disc by orange tab. Rub abrader in 40 strokes over the upper left chest as  indicated in your monitor instructions.  Clean area with 4 enclosed alcohol pads. Let dry.  Apply patch as indicated in monitor instructions. Patch will be placed under collarbone on left  side of chest with arrow pointing upward.  Rub patch adhesive wings for 2 minutes. Remove white label marked 1. Remove the white  label marked 2. Rub patch adhesive wings for 2 additional minutes.  While looking in a mirror, press and release button in center of patch. A small green light will  flash 3-4 times. This will be your only indicator that the monitor has been turned on.  Do not shower for the first 24 hours. You may shower after the first 24 hours.  Press the button if you feel a symptom. You will hear a small click. Record Date, Time and  Symptom in the Patient Logbook.  When you are ready to remove the patch, follow instructions on the last 2 pages of Patient  Logbook. Stick patch monitor onto the last page of Patient Logbook.  Place Patient Logbook in the blue and white box. Use locking tab on box and tape box closed  securely. The blue and white box has prepaid postage on it. Please place it in the mailbox as  soon as possible. Your physician should have your test results approximately 7 days after the  monitor has been mailed back to [facility].  Call [facility] Customer Care at [phone] if you have questions regarding  your ZIO XT patch monitor. Call them immediately if you see an orange light blinking on your  monitor.  If your monitor falls off in less than 4 days, contact our Monitor department at [phone].  If your monitor becomes loose or falls off after 4 days call [employer] at [phone] for  suggestions on securing your monitor  Follow-Up: At [facility], you and your health needs are our priority.  As part of our continuing mission to  provide you with exceptional heart care, our providers are all part of one team.  This team includes your primary Cardiologist (physician) and Advanced Practice Providers or APPs (Physician Assistants and Nurse Practitioners) who all work together to provide you with the care you need, when you need it.  Your next appointment:   3 month(s)  Provider:   One of our Advanced Practice Providers (APPs): Morse Clause, PA-C  Lamarr Satterfield, NP Miriam Shams, NP  Olivia Pavy, PA-C Josefa Beauvais, NP  Leontine Salen, PA-C Orren Fabry, PA-C  [place], [place] Jackee Alberts, NP  Damien Braver, NP Jon Hails, PA-C  Waddell Donath, PA-C    Dayna Dunn, PA-C  Glendia Ferrier, PA-C Lum Louis, NP Katlyn West, NP Callie Goodrich, PA-C  Evan Williams, PA-C Sheng Haley, PA-C  Xika Zhao, NP Kathleen Johnson, PA-C  We recommend signing up for the patient portal called MyChart.  Sign up information is provided on this After Visit Summary.  MyChart is used to connect with patients for Virtual Visits (Telemedicine).  Patients are able to view lab/test results, encounter notes, upcoming appointments, etc.  Non-urgent messages can be sent to your provider as well.   To learn more about what you can do with MyChart, go to [email or web address].

## 2023-12-03 NOTE — Progress Notes (Signed)
 Cardiology Office Note:  .    Date:  12/03/2023  ID:  Amanda Waters, DOB 1956/05/11, MRN [id number] PCP: Pura Lenis, MD  [facility] Providers Cardiologist:  Candyce Reek, MD     CC: Tachycardia Consulted for the evaluation of exertional at the behest of Dr. Pura.   History of Present Illness: .    Amanda Waters is a 68 y.o. female with prior evaluation by Dr. Reek.  CSA, exercise induced tachycardia.  Asked for potential stress testing (Dr. Pura).  She experiences episodes of tachycardia, particularly during physical activity such as dance classes. Her heart rate has been recorded as high as 160 to 172 beats per minute during these activities. She feels 'wiped out' after these episodes, affecting her energy levels for the rest of the day and sometimes into the next day.  On December 17, 2022, she experienced a significant event during a dance class where her heart rate dropped to 98-99 bpm and then suddenly spiked to 192 bpm, causing her knees to buckle and leaving her feeling shaky and weak. She was pale and required rest and cold compresses to recover. Since then, she has been cautious during dance classes, monitoring her heart rate closely and taking breaks to manage it.  We reviewed her phone and heart rate logs from 2024.  She has a history of central sleep apnea and has been previously evaluated with an echocardiogram in 2021, which showed no significant valve disease or cardiac dysfunction but did reveal rare premature atrial and ventricular contractions.  Her family history includes her father having a pacemaker and suffering from strokes, and her brother having a heart attack before age 1. She also mentions a personal history of high cholesterol and osteoporosis, and she has been informed that her body does not process calcium well.  She is currently taking melatonin, B12, fish oil, D3, and vitamin E (less than 400 units) but is not on any cardiac  medications. She has been participating in a study focusing on exercise, diet, and cognitive health, and follows a DASH diet, although she admits to occasional dietary lapses.  Discussed the use of AI scribe software for clinical note transcription with the patient, who gave verbal consent to proceed.   Relevant histories: .  Social  - former [employer] patient, comes in with her husband, Amanda Waters - Tobacco: Never smoker - The patient participates in a study with [facility] and the [employer], focusing on exercise, diet, and brain games. She follows the DASH diet and is physically active, attending dance classes regularly. She has a family history of heart disease, with her father having had a pacemaker and her brother having a heart attack before age 16. She has fibromyalgia and high cholesterol, which she manages without statins due to concerns about side effects. She has experienced recent stress due to the loss of her husband's father. ROS: As per HPI.   Studies Reviewed: .     Cardiac Studies & Procedures   ______________________________________________________________________________________________     ECHOCARDIOGRAM  ECHOCARDIOGRAM COMPLETE 07/30/2019  Narrative ECHOCARDIOGRAM REPORT    Patient Name:   Amanda Waters Date of Exam: 07/30/2019 Medical Rec #:  [id number]       Height:       65.0 in Accession #:    [id number]      Weight:       143.8 lb Date of Birth:  Apr 11, 1956      BSA:  1.719 m Patient Age:    63 years        BP:           134/84 mmHg Patient Gender: F               HR:           78 bpm. Exam Location:  [address]  Procedure: 2D Echo, 3D Echo, Cardiac Doppler, Color Doppler and Strain Analysis  Indications:    R00.0 Tachycardia  History:        Patient has prior history of Echocardiogram examinations, most recent 08/29/2006. Risk Factors:Dyslipidemia. Sleep apnea.  Sonographer:    Nolon Berg BA, RDCS Referring Phys: [id number] JAYADEEP  S VARANASI  IMPRESSIONS   1. Left ventricular ejection fraction, by estimation, is 60 to 65%. The left ventricle has normal function. The left ventricle has no regional wall motion abnormalities. Left ventricular diastolic parameters are consistent with Grade I diastolic dysfunction (impaired relaxation). The average left ventricular global longitudinal strain is -21.0 %. 2. Right ventricular systolic function is normal. The right ventricular size is normal. 3. The mitral valve is normal in structure. No evidence of mitral valve regurgitation. No evidence of mitral stenosis. 4. The aortic valve is normal in structure. Aortic valve regurgitation is not visualized. No aortic stenosis is present. 5. The inferior vena cava is normal in size with greater than 50% respiratory variability, suggesting right atrial pressure of 3 mmHg.  FINDINGS Left Ventricle: Left ventricular ejection fraction, by estimation, is 60 to 65%. The left ventricle has normal function. The left ventricle has no regional wall motion abnormalities. The average left ventricular global longitudinal strain is -21.0 %. The left ventricular internal cavity size was normal in size. There is no left ventricular hypertrophy. Left ventricular diastolic parameters are consistent with Grade I diastolic dysfunction (impaired relaxation). Normal left ventricular filling pressure.  Right Ventricle: The right ventricular size is normal. No increase in right ventricular wall thickness. Right ventricular systolic function is normal.  Left Atrium: Left atrial size was normal in size.  Right Atrium: Right atrial size was normal in size.  Pericardium: There is no evidence of pericardial effusion.  Mitral Valve: The mitral valve is normal in structure. Normal mobility of the mitral valve leaflets. No evidence of mitral valve regurgitation. No evidence of mitral valve stenosis.  Tricuspid Valve: The tricuspid valve is normal in structure.  Tricuspid valve regurgitation is not demonstrated. No evidence of tricuspid stenosis.  Aortic Valve: The aortic valve is normal in structure. Aortic valve regurgitation is not visualized. No aortic stenosis is present.  Pulmonic Valve: The pulmonic valve was normal in structure. Pulmonic valve regurgitation is not visualized. No evidence of pulmonic stenosis.  Aorta: The aortic root is normal in size and structure.  Venous: The inferior vena cava is normal in size with greater than 50% respiratory variability, suggesting right atrial pressure of 3 mmHg.  IAS/Shunts: No atrial level shunt detected by color flow Doppler.   LEFT VENTRICLE PLAX 2D LVIDd:         4.16 cm  Diastology LVIDs:         2.30 cm  LV e' lateral:   8.05 cm/s LV PW:         0.71 cm  LV E/e' lateral: 7.6 LV IVS:        0.51 cm  LV e' medial:    8.70 cm/s LVOT diam:     2.00 cm  LV E/e' medial:  7.0  LV SV:         46 LV SV Index:   27       2D Longitudinal Strain LVOT Area:     3.14 cm 2D Strain GLS Avg:     -21.0 %   RIGHT VENTRICLE RV Basal diam:  2.50 cm RV Mid diam:    2.55 cm RV S prime:     8.95 cm/s  LEFT ATRIUM             Index LA diam:        3.00 cm 1.74 cm/m LA Vol (A2C):   37.2 ml 21.64 ml/m LA Vol (A4C):   29.8 ml 17.33 ml/m LA Biplane Vol: 33.3 ml 19.37 ml/m AORTIC VALVE LVOT Vmax:   73.70 cm/s LVOT Vmean:  55.400 cm/s LVOT VTI:    0.146 m  AORTA Ao Root diam: 2.80 cm Ao Asc diam:  2.90 cm  MITRAL VALVE MV Area (PHT): 3.53 cm    SHUNTS MV Decel Time: 215 msec    Systemic VTI:  0.15 m MV E velocity: 61.00 cm/s  Systemic Diam: 2.00 cm MV A velocity: 84.40 cm/s MV E/A ratio:  0.72  Mihai Croitoru MD Electronically signed by Jerel Balding MD Signature Date/Time: 07/30/2019/5:41:05 PM    Final    MONITORS  LONG TERM MONITOR (3-14 DAYS) 08/25/2019  Narrative  Normal sinus rhythm with rare PACs and PVCs.  No pathologic arrhtyhmias.        ______________________________________________________________________________________________        Physical Exam:    VS:  BP 119/79 (BP Location: Left Arm, Patient Position: Sitting, Cuff Size: Normal)   Pulse 76   Ht 5' 5 (1.651 m)   Wt 144 lb 12.8 oz (65.7 kg)   SpO2 98%   BMI 24.10 kg/m    Wt Readings from Last 3 Encounters:  12/03/23 144 lb 12.8 oz (65.7 kg)  07/23/23 145 lb 8 oz (66 kg)  01/16/22 143 lb (64.9 kg)    Gen: no distress   Neck: No JVD Cardiac: No Rubs or Gallops, no murmur, RRR +2 radial pulses Respiratory: Clear to auscultation bilaterally, normal effort, normal  respiratory rate GI: Soft, nontender, non-distended  MS: No  edema;  moves all extremities Integument: Skin feels warm Neuro:  At time of evaluation, alert and oriented to person/place/time/situation  Psych: Normal affect, patient feels ok   ASSESSMENT AND PLAN: .    Exercise-induced tachycardia Palpitations Chest pain with FHX of CAD (brother before age 30) - Palpitations and tachycardia with activity, significant heart rate increase with minimal exertion. Differential includes arrhythmia, coronary artery disease, and chronotropic incompetence. Family history of heart disease. - Order exercise nuclear medicine stress test to evaluate cardiac function during exertion; perform this with CT attenuation; had aortic atherosclerosis - Order heart monitor to assess rhythm during exercise, specifically during a dance class.  Aortic atherosclerosis Aortic atherosclerosis noted on CT scan from June 2023, considered in the context of elevated cholesterol and family history of coronary artery disease- reviewed CT with patient and husband  Hyperlipidemia Long-standing elevated cholesterol with good HDL levels but high overall cholesterol. Family history of early coronary artery disease. Prefers non-statin treatment due to concerns about fibromyalgia and potential statin myalgias. Currently on a  DASH diet and regular exercise. - Start non-statin medication for cholesterol management. - Recheck lipid panel in 3 months.  F/u with my team in the Fall  Stanly Leavens, MD FASE [facility] Cardiologist [facility]  [facility]  [address] [place], [place] [address] [phone]  1:06 PM

## 2023-12-05 ENCOUNTER — Telehealth (HOSPITAL_COMMUNITY): Payer: Self-pay | Admitting: *Deleted

## 2023-12-05 NOTE — Telephone Encounter (Signed)
 Left message on voicemail per DPR in reference to upcoming appointment scheduled on 12/10/2023 at 10:15 with detailed instructions given per Myocardial Perfusion Study Information Sheet for the test. LM to arrive 15 minutes early, and that it is imperative to arrive on time for appointment to keep from having the test rescheduled. If you need to cancel or reschedule your appointment, please call the office within 24 hours of your appointment. Failure to do so may result in a cancellation of your appointment, and a $50 no show fee. Phone number given for call back for any questions.

## 2023-12-08 ENCOUNTER — Other Ambulatory Visit: Payer: Self-pay | Admitting: Internal Medicine

## 2023-12-08 DIAGNOSIS — R079 Chest pain, unspecified: Secondary | ICD-10-CM

## 2023-12-09 ENCOUNTER — Encounter: Payer: Self-pay | Admitting: Physical Therapy

## 2023-12-09 ENCOUNTER — Other Ambulatory Visit: Payer: Self-pay

## 2023-12-09 ENCOUNTER — Ambulatory Visit: Attending: Obstetrics and Gynecology | Admitting: Physical Therapy

## 2023-12-09 DIAGNOSIS — R252 Cramp and spasm: Secondary | ICD-10-CM | POA: Insufficient documentation
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
## 2023-12-10 ENCOUNTER — Ambulatory Visit (HOSPITAL_COMMUNITY)
Admission: RE | Admit: 2023-12-10 | Discharge: 2023-12-10 | Disposition: A | Discharge status: Home / Self Care | Source: Ambulatory Visit | Attending: Internal Medicine | Admitting: Internal Medicine

## 2023-12-10 DIAGNOSIS — R079 Chest pain, unspecified: Secondary | ICD-10-CM | POA: Diagnosis present

## 2023-12-10 LAB — MYOCARDIAL PERFUSION IMAGING
Angina Index: 0
Base ST Depression (mm): 0 mm
Duke Treadmill Score: 6
Estimated workload: 7
Exercise duration (min): 6 min
Exercise duration (sec): 0 s
LV dias vol: 62 mL (ref 46–106)
LV sys vol: 17 mL (ref 3.8–5.2)
MPHR: 155 {beats}/min
Nuc Stress EF: 73 %
Peak HR: 157 {beats}/min
Percent HR: 101 %
Rest HR: 67 {beats}/min
Rest Nuclear Isotope Dose: 9.4 mCi
SDS: 6
SRS: 4
SSS: 10
ST Depression (mm): 0 mm
Stress Nuclear Isotope Dose: 31.4 mCi
TID: 0.97

## 2023-12-10 MED ORDER — TECHNETIUM TC 99M TETROFOSMIN IV KIT
30.2000 | PACK | Freq: Once | INTRAVENOUS | Status: AC | PRN
  Administered 2023-12-10: 30.2 via INTRAVENOUS

## 2023-12-10 MED ORDER — TECHNETIUM TC 99M TETROFOSMIN IV KIT
9.4000 | PACK | Freq: Once | INTRAVENOUS | Status: AC | PRN
  Administered 2023-12-10: 9.4 via INTRAVENOUS

## 2023-12-11 ENCOUNTER — Ambulatory Visit: Payer: Self-pay | Admitting: Internal Medicine

## 2023-12-11 DIAGNOSIS — Z8669 Personal history of other diseases of the nervous system and sense organs: Secondary | ICD-10-CM

## 2023-12-12 ENCOUNTER — Telehealth: Payer: Self-pay | Admitting: Physical Therapy

## 2023-12-12 NOTE — Telephone Encounter (Signed)
 Left patient a phone call to call us  and schedule follow up PT appts

## 2023-12-15 ENCOUNTER — Ambulatory Visit: Admitting: Student

## 2023-12-15 MED ORDER — ASPIRIN 81 MG PO TBEC: 81.0000 mg | DELAYED_RELEASE_TABLET | Freq: Every day | ORAL | Status: AC

## 2023-12-15 MED ORDER — METOPROLOL TARTRATE 25 MG PO TABS: 12.5000 mg | ORAL_TABLET | Freq: Two times a day (BID) | ORAL | 3 refills | Status: DC

## 2023-12-16 ENCOUNTER — Telehealth: Payer: Self-pay

## 2023-12-16 NOTE — Telephone Encounter (Addendum)
 Patient has been scheduled for 01/12/24 at 10:55 due to error. Patient told that she was to come in on 12/30/23 with Callie Goodrich however, she was not added to the schedule on that day. After speaking with her, she agreed to the new scheduled date and time.

## 2023-12-19 ENCOUNTER — Emergency Department (HOSPITAL_COMMUNITY)

## 2023-12-19 ENCOUNTER — Inpatient Hospital Stay (HOSPITAL_COMMUNITY)
Admission: EM | Admit: 2023-12-19 | Discharge: 2023-12-22 | DRG: 310 | Disposition: A | Discharge status: Home / Self Care | Attending: Internal Medicine | Admitting: Internal Medicine

## 2023-12-19 ENCOUNTER — Encounter (HOSPITAL_COMMUNITY): Payer: Self-pay | Admitting: Emergency Medicine

## 2023-12-19 ENCOUNTER — Other Ambulatory Visit: Payer: Self-pay

## 2023-12-19 DIAGNOSIS — R079 Chest pain, unspecified: Secondary | ICD-10-CM | POA: Diagnosis not present

## 2023-12-19 DIAGNOSIS — M797 Fibromyalgia: Secondary | ICD-10-CM | POA: Diagnosis present

## 2023-12-19 DIAGNOSIS — Z808 Family history of malignant neoplasm of other organs or systems: Secondary | ICD-10-CM

## 2023-12-19 DIAGNOSIS — Z882 Allergy status to sulfonamides status: Secondary | ICD-10-CM

## 2023-12-19 DIAGNOSIS — Z818 Family history of other mental and behavioral disorders: Secondary | ICD-10-CM

## 2023-12-19 DIAGNOSIS — Z860101 Personal history of adenomatous and serrated colon polyps: Secondary | ICD-10-CM

## 2023-12-19 DIAGNOSIS — Z87442 Personal history of urinary calculi: Secondary | ICD-10-CM

## 2023-12-19 DIAGNOSIS — Z8249 Family history of ischemic heart disease and other diseases of the circulatory system: Secondary | ICD-10-CM

## 2023-12-19 DIAGNOSIS — K219 Gastro-esophageal reflux disease without esophagitis: Secondary | ICD-10-CM | POA: Diagnosis present

## 2023-12-19 DIAGNOSIS — E785 Hyperlipidemia, unspecified: Secondary | ICD-10-CM | POA: Diagnosis present

## 2023-12-19 DIAGNOSIS — M549 Dorsalgia, unspecified: Secondary | ICD-10-CM | POA: Diagnosis present

## 2023-12-19 DIAGNOSIS — Z9049 Acquired absence of other specified parts of digestive tract: Secondary | ICD-10-CM

## 2023-12-19 DIAGNOSIS — Z885 Allergy status to narcotic agent status: Secondary | ICD-10-CM

## 2023-12-19 DIAGNOSIS — Z7982 Long term (current) use of aspirin: Secondary | ICD-10-CM

## 2023-12-19 DIAGNOSIS — I700 Atherosclerosis of aorta: Secondary | ICD-10-CM | POA: Diagnosis present

## 2023-12-19 DIAGNOSIS — Z83719 Family history of colon polyps, unspecified: Secondary | ICD-10-CM

## 2023-12-19 DIAGNOSIS — G8929 Other chronic pain: Secondary | ICD-10-CM | POA: Diagnosis present

## 2023-12-19 DIAGNOSIS — Z79899 Other long term (current) drug therapy: Secondary | ICD-10-CM

## 2023-12-19 DIAGNOSIS — I10 Essential (primary) hypertension: Secondary | ICD-10-CM | POA: Diagnosis present

## 2023-12-19 DIAGNOSIS — I2510 Atherosclerotic heart disease of native coronary artery without angina pectoris: Secondary | ICD-10-CM | POA: Diagnosis present

## 2023-12-19 DIAGNOSIS — R000 Tachycardia, unspecified: Principal | ICD-10-CM | POA: Diagnosis present

## 2023-12-19 DIAGNOSIS — Z833 Family history of diabetes mellitus: Secondary | ICD-10-CM

## 2023-12-19 DIAGNOSIS — M8580 Other specified disorders of bone density and structure, unspecified site: Secondary | ICD-10-CM | POA: Diagnosis present

## 2023-12-19 DIAGNOSIS — Z888 Allergy status to other drugs, medicaments and biological substances status: Secondary | ICD-10-CM

## 2023-12-19 LAB — BASIC METABOLIC PANEL WITH GFR
Anion gap: 10 (ref 5–15)
BUN: 15 mg/dL (ref 8–23)
CO2: 23 mmol/L (ref 22–32)
Calcium: 9.1 mg/dL (ref 8.9–10.3)
Chloride: 103 mmol/L (ref 98–111)
Creatinine, Ser: 0.97 mg/dL (ref 0.44–1.00)
GFR, Estimated: >60 mL/min (ref >60)
Glucose, Bld: 109 mg/dL — ABNORMAL HIGH (ref 70–99)
Potassium: 3.7 mmol/L (ref 3.5–5.1)
Sodium: 136 mmol/L (ref 135–145)

## 2023-12-19 LAB — CBC
HCT: 34.1 % — ABNORMAL LOW (ref 36.0–46.0)
Hemoglobin: 11.1 g/dL — ABNORMAL LOW (ref 12.0–15.0)
MCH: 26.9 pg (ref 26.0–34.0)
MCHC: 32.6 g/dL (ref 30.0–36.0)
MCV: 82.6 fL (ref 80.0–100.0)
Platelets: 304 K/uL (ref 150–400)
RBC: 4.13 MIL/uL (ref 3.87–5.11)
RDW: 14.2 % (ref 11.5–15.5)
WBC: 6.1 K/uL (ref 4.0–10.5)
nRBC: 0 % (ref 0.0–0.2)

## 2023-12-19 LAB — TROPONIN I (HIGH SENSITIVITY): Troponin I (High Sensitivity): 3 ng/L (ref <18)

## 2023-12-19 MED ORDER — LORAZEPAM 2 MG/ML IJ SOLN
1.0000 mg | Freq: Once | INTRAMUSCULAR | Status: AC | PRN
Start: 2023-12-19 — End: 2023-12-22
  Administered 2023-12-22: 1 mg via INTRAVENOUS
  Filled 2023-12-19: qty 1

## 2023-12-19 MED ORDER — IOHEXOL 350 MG/ML SOLN
75.0000 mL | Freq: Once | INTRAVENOUS | Status: AC | PRN
  Administered 2023-12-19: 75 mL via INTRAVENOUS

## 2023-12-19 NOTE — ED Triage Notes (Signed)
 Pt arrive by EMS from  home for CP that started one hour pta, radiating to her left arm and left jaw. Pt states it feels very heavy and is having lots of burps. Hx MI and Thalia. BP 190/100, HR 84, R 18 96% on RA.

## 2023-12-19 NOTE — ED Provider Notes (Signed)
 [place] EMERGENCY DEPARTMENT AT [facility] Provider Note   CSN: [id number] Arrival date & time: 12/19/23  2004     Patient presents with: Chest Pain   Amanda Waters is a 68 y.o. female.   68 year old female with recent abnormal stress test presenting to the emergency department today with left-sided chest pressure.  The patient states that she was having some nausea and clamminess.  Reports that she had some some chest pressure that started a few hours prior to arrival.  The patient states that she started having some left-sided chest pressure this afternoon.  States this started radiating to her left jaw and left arm which is when she came to the ER for further evaluation.  The patient denies any focal weakness.  She initially was not having any numbness or tingling.  She states that after receiving nitroglycerin with medics she developed some tingling in the left side of her body.  Denied any weakness.  She was evaluated at triage and CT angiogram and MRI were ordered.  These are pending.  The patient states that the numbness sensation has improved significantly.  She came to the ER today for further evaluation regarding this.  She denies a history of DVT or pulmonary embolism, recent surgeries, recent travel.  She reports that with the medication she received with medics that she is no longer experiencing any chest pain.   Chest Pain      Prior to Admission medications   Medication Sig Start Date End Date Taking? Authorizing Provider  aspirin  EC 81 MG tablet Take 1 tablet (81 mg total) by mouth daily. Swallow whole. 12/15/23   Santo Stanly LABOR, MD  Bacillus Coagulans-Inulin (ALIGN PREBIOTIC-PROBIOTIC PO) Take 1 capsule by mouth daily at 6 (six) AM. 09/18/23   [provider]  Calcium Carbonate-Vitamin D 600-400 MG-UNIT chew tablet Chew 1 tablet by mouth daily.    [provider]  Cholecalciferol (WELL VITAMIN D3) 50 MCG (2000 UT) CAPS Take 2,000  Units by mouth daily at 6 (six) AM. 05/20/17   [provider]  cyclobenzaprine (FLEXERIL) 10 MG tablet Take 10 mg by mouth 3 (three) times daily as needed. Patient not taking: Reported on 12/03/2023    [provider]  ezetimibe  (ZETIA ) 10 MG tablet Take 1 tablet (10 mg total) by mouth daily. 12/03/23   Santo Stanly LABOR, MD  famciclovir (FAMVIR) 500 MG tablet Take 500 mg by mouth daily.    [provider]  famotidine  (PEPCID ) 20 MG tablet Take 20 mg by mouth at bedtime.    [provider]  gabapentin  (NEURONTIN ) 600 MG tablet Take 1 tablet (600 mg total) by mouth 3 (three) times daily. 10/30/21   Ines Onetha NOVAK, MD  HYDROcodone -acetaminophen  (NORCO/VICODIN) 5-325 MG per tablet Take 1 tablet by mouth every 6 (six) hours as needed for pain. Patient not taking: Reported on 12/03/2023    [provider]  Melatonin 10 MG CHEW Chew 10 mg by mouth daily at 6 (six) AM. 05/20/22   [provider]  metoprolol  tartrate (LOPRESSOR ) 25 MG tablet Take 0.5 tablets (12.5 mg total) by mouth 2 (two) times daily. 12/15/23   Santo Stanly LABOR, MD  Multiple Vitamins-Minerals (CENTRUM SILVER ADULT 50+ PO) Take by mouth daily.    [provider]  Nutritional Supplements (DHEA PO) Take 5 mg by mouth daily.    [provider]  Omega-3 Fatty Acids (FISH OIL) 1000 MG CAPS Take 1,000 mg by mouth daily at  6 (six) AM. 05/20/18   [provider]  pantoprazole  (PROTONIX ) 40 MG tablet TAKE ONE TABLET BY MOUTH ONE TIME DAILY 11/24/23   Beather Delon Gibson, PA  polyethylene glycol powder (GLYCOLAX/MIRALAX) 17 GM/SCOOP powder Take 17 g by mouth daily.    [provider]  pyridOXINE (VITAMIN B6) 25 MG tablet Take 25 mg by mouth daily. 11/23/20   [provider]  vitamin E 400 UNIT capsule Take 400 Units by mouth daily.    [provider]  zolpidem  (AMBIEN ) 5 MG tablet Take 1 tablet (5 mg total) by mouth at bedtime as needed.  for sleep 08/21/21   Ines Onetha NOVAK, MD    Allergies: Celebrex [celecoxib], Darvon [propoxyphene], Duract [bromfenac], Nitrofuran derivatives, Oxycodone-acetaminophen , Prednisone, and Sulfa antibiotics    Review of Systems  Cardiovascular:  Positive for chest pain.  All other systems reviewed and are negative.   Updated Vital Signs BP (!) 154/64 (BP Location: Right Arm)   Pulse 67   Temp 98.3 F (36.8 C)   Resp 18   Ht 5' 5 (1.651 m)   Wt 66 kg   SpO2 100%   BMI 24.21 kg/m   Physical Exam Vitals and nursing note reviewed.   Gen: NAD Eyes: PERRL, EOMI HEENT: no oropharyngeal swelling Neck: trachea midline Resp: clear to auscultation bilaterally Card: RRR, no murmurs, rubs, or gallops Abd: nontender, nondistended Extremities: no calf tenderness, no edema Vascular: 2+ radial pulses bilaterally, 2+ DP pulses bilaterally Neuro: Cranial nerves intact, equal strength and sensation throughout the bilateral upper and lower extremities with no dysmetria on finger-to-nose testing with the exception of the patient is reporting some mild numbness over her left cheek Skin: no rashes Psyc: acting appropriately   (all labs ordered are listed, but only abnormal results are displayed) Labs Reviewed  BASIC METABOLIC PANEL WITH GFR - Abnormal; Notable for the following components:      Result Value   Glucose, Bld 109 (*)    All other components within normal limits  CBC - Abnormal; Notable for the following components:   Hemoglobin 11.1 (*)    HCT 34.1 (*)    All other components within normal limits  TROPONIN I (HIGH SENSITIVITY)  TROPONIN I (HIGH SENSITIVITY)    EKG: EKG Interpretation Date/Time:  Friday December 19 2023 20:07:55 EDT Ventricular Rate:  71 PR Interval:  132 QRS Duration:  76 QT Interval:  360 QTC Calculation: 391 R Axis:   58  Text Interpretation: Sinus rhythm with Premature supraventricular complexes Low voltage QRS Borderline ECG When compared with ECG of  03-Dec-2023 10:47, PREVIOUS ECG IS PRESENT No significant change was found Confirmed by Carita Senior [phone]) on 12/19/2023 11:19:47 PM  Radiology: CT ANGIO HEAD NECK W WO CM Result Date: 12/19/2023 EXAM: CTA HEAD AND NECK WITH AND WITHOUT 12/19/2023 10:07:26 PM TECHNIQUE: CTA of the head and neck was performed with and without the administration of intravenous contrast. Multiplanar 2D and/or 3D reformatted images are provided for review. Automated exposure control, iterative reconstruction, and/or weight based adjustment of the mA/kV was utilized to reduce the radiation dose to as low as reasonably achievable. Stenosis of the internal carotid arteries measured using NASCET criteria. COMPARISON: None available CLINICAL HISTORY: Neuro deficit, acute, stroke suspected. FINDINGS: CTA NECK: AORTIC ARCH AND ARCH VESSELS: No dissection or arterial injury. No significant stenosis of the brachiocephalic or subclavian arteries. CERVICAL CAROTID ARTERIES: No dissection, arterial injury, or hemodynamically significant stenosis by NASCET criteria. CERVICAL VERTEBRAL ARTERIES: No dissection, arterial  injury, or significant stenosis. LUNGS AND MEDIASTINUM: Unremarkable. SOFT TISSUES: No acute abnormality. BONES: No acute abnormality. CTA HEAD: ANTERIOR CIRCULATION: No significant stenosis of the internal carotid arteries. No significant stenosis of the anterior cerebral arteries. No significant stenosis of the middle cerebral arteries. No aneurysm. POSTERIOR CIRCULATION: No significant stenosis of the posterior cerebral arteries. No significant stenosis of the basilar artery. No significant stenosis of the vertebral arteries. No aneurysm. OTHER: No dural venous sinus thrombosis on this non-dedicated study. Mild chronic ischemic white matter changes. IMPRESSION: 1. No large vessel occlusion, hemodynamically significant stenosis, or aneurysm in the head or neck. 2. Mild chronic ischemic white matter changes. Electronically  signed by: Franky Stanford MD 12/19/2023 10:23 PM EDT RP Workstation: [id number]   DG Chest 2 View Result Date: 12/19/2023 EXAM: 2 VIEW(S) XRAY OF THE CHEST 12/19/2023 08:46:00 PM COMPARISON: May 16 2017. CLINICAL HISTORY: cp. FINDINGS: LUNGS AND PLEURA: No focal pulmonary opacity. No pulmonary edema. No pleural effusion. No pneumothorax. HEART AND MEDIASTINUM: No acute abnormality of the cardiac and mediastinal silhouettes. BONES AND SOFT TISSUES: No acute osseous abnormality. IMPRESSION: 1. No acute process. Electronically signed by: Norman Gatlin MD 12/19/2023 08:49 PM EDT RP Workstation: [id number]     Procedures   Medications Ordered in the ED  LORazepam (ATIVAN) injection 1 mg (has no administration in time range)  iohexol  (OMNIPAQUE ) 350 MG/ML injection 75 mL (75 mLs Intravenous Contrast Given 12/19/23 2207)                                    Medical Decision Making 68 year old female with recent abnormal stress test presenting to the emergency department today with left-sided chest pain/pressure.  I will further evaluate the patient here with basic lab as well as an EKG, chest x-ray, and troponin for further evaluation for ACS, pulmonary edema, pulmonary infiltrates, or pneumothorax.  The patient did have a CT angiogram performed that was ordered at triage.  This does not show any obvious ascending aortic dissection or large vessel occlusion/dissection here.  Based on description of her symptoms and with her symptoms resolving suspicion for this is low at this time.  Her initial troponin is negative.  She has nonspecific changes on her EKG.  Plan is for discussion with cardiology but given her plan for left heart catheterization and abnormal stress test recently suspect she will require admission.  Amount and/or Complexity of Data Reviewed Labs: ordered. Radiology: ordered.        Final diagnoses:  Chest pain, unspecified type    ED Discharge Orders     None           Ula Prentice SAUNDERS, MD 12/19/23 2329

## 2023-12-19 NOTE — ED Notes (Signed)
The pt returned from mri 

## 2023-12-19 NOTE — ED Provider Triage Note (Signed)
 Emergency Medicine Provider Triage Evaluation Note  Amanda Waters , a 68 y.o. female  was evaluated in triage.  Pt complains of chest pain, L sided numbness. L sided chest pressure and pain started an hour prior to arrival. While in the ambulance, she received nitroglycerin and she developed L facial and arm numbness.  Patient had an abnormal stress test this past week  Review of Systems  Positive: Chest pain, l arm numbness  Negative:   Physical Exam  BP (!) 154/64 (BP Location: Right Arm)   Pulse 67   Temp 98.3 F (36.8 C)   Resp 18   Ht 5' 5 (1.651 m)   Wt 66 kg   SpO2 100%   BMI 24.21 kg/m  Gen:   Awake, uncomfortable  Resp:  Normal effort  MSK:   Moves extremities without difficulty, decreased sensation in the left arm and leg Other:    Medical Decision Making  Medically screening exam initiated at 8:22 PM.  Appropriate orders placed.  Amanda Waters was informed that the remainder of the evaluation will be completed by another provider, this initial triage assessment does not replace that evaluation, and the importance of remaining in the ED until their evaluation is complete.  Amanda Waters is a 68 y.o. female here with chest pain and left-sided numbness.  Symptoms just prior to arrival.  Patient has decreased sensation but no speech deficit or motor deficit.  Will get MRI brain and troponin x 2 and will bring back for further evaluation    Patt Alm Macho, MD 12/19/23 2032

## 2023-12-19 NOTE — ED Notes (Signed)
 To mri monitor removed from her chest  order of dr tee

## 2023-12-20 DIAGNOSIS — M549 Dorsalgia, unspecified: Secondary | ICD-10-CM | POA: Diagnosis present

## 2023-12-20 DIAGNOSIS — Z885 Allergy status to narcotic agent status: Secondary | ICD-10-CM | POA: Diagnosis not present

## 2023-12-20 DIAGNOSIS — R072 Precordial pain: Secondary | ICD-10-CM | POA: Diagnosis not present

## 2023-12-20 DIAGNOSIS — R000 Tachycardia, unspecified: Secondary | ICD-10-CM | POA: Diagnosis present

## 2023-12-20 DIAGNOSIS — M797 Fibromyalgia: Secondary | ICD-10-CM | POA: Diagnosis present

## 2023-12-20 DIAGNOSIS — Z79899 Other long term (current) drug therapy: Secondary | ICD-10-CM | POA: Diagnosis not present

## 2023-12-20 DIAGNOSIS — I700 Atherosclerosis of aorta: Secondary | ICD-10-CM | POA: Diagnosis present

## 2023-12-20 DIAGNOSIS — R079 Chest pain, unspecified: Secondary | ICD-10-CM

## 2023-12-20 DIAGNOSIS — I10 Essential (primary) hypertension: Secondary | ICD-10-CM | POA: Diagnosis present

## 2023-12-20 DIAGNOSIS — Z818 Family history of other mental and behavioral disorders: Secondary | ICD-10-CM | POA: Diagnosis not present

## 2023-12-20 DIAGNOSIS — Z888 Allergy status to other drugs, medicaments and biological substances status: Secondary | ICD-10-CM | POA: Diagnosis not present

## 2023-12-20 DIAGNOSIS — R931 Abnormal findings on diagnostic imaging of heart and coronary circulation: Secondary | ICD-10-CM | POA: Diagnosis not present

## 2023-12-20 DIAGNOSIS — Z8249 Family history of ischemic heart disease and other diseases of the circulatory system: Secondary | ICD-10-CM | POA: Diagnosis not present

## 2023-12-20 DIAGNOSIS — K219 Gastro-esophageal reflux disease without esophagitis: Secondary | ICD-10-CM | POA: Diagnosis present

## 2023-12-20 DIAGNOSIS — E785 Hyperlipidemia, unspecified: Secondary | ICD-10-CM | POA: Diagnosis present

## 2023-12-20 DIAGNOSIS — Z9049 Acquired absence of other specified parts of digestive tract: Secondary | ICD-10-CM | POA: Diagnosis not present

## 2023-12-20 DIAGNOSIS — Z860101 Personal history of adenomatous and serrated colon polyps: Secondary | ICD-10-CM | POA: Diagnosis not present

## 2023-12-20 DIAGNOSIS — Z882 Allergy status to sulfonamides status: Secondary | ICD-10-CM | POA: Diagnosis not present

## 2023-12-20 DIAGNOSIS — Z87442 Personal history of urinary calculi: Secondary | ICD-10-CM | POA: Diagnosis not present

## 2023-12-20 DIAGNOSIS — Z808 Family history of malignant neoplasm of other organs or systems: Secondary | ICD-10-CM | POA: Diagnosis not present

## 2023-12-20 DIAGNOSIS — E7800 Pure hypercholesterolemia, unspecified: Secondary | ICD-10-CM | POA: Diagnosis not present

## 2023-12-20 DIAGNOSIS — Z7982 Long term (current) use of aspirin: Secondary | ICD-10-CM | POA: Diagnosis not present

## 2023-12-20 DIAGNOSIS — M8580 Other specified disorders of bone density and structure, unspecified site: Secondary | ICD-10-CM | POA: Diagnosis present

## 2023-12-20 DIAGNOSIS — G8929 Other chronic pain: Secondary | ICD-10-CM | POA: Diagnosis present

## 2023-12-20 DIAGNOSIS — I2510 Atherosclerotic heart disease of native coronary artery without angina pectoris: Secondary | ICD-10-CM | POA: Diagnosis present

## 2023-12-20 DIAGNOSIS — Z833 Family history of diabetes mellitus: Secondary | ICD-10-CM | POA: Diagnosis not present

## 2023-12-20 DIAGNOSIS — Z83719 Family history of colon polyps, unspecified: Secondary | ICD-10-CM | POA: Diagnosis not present

## 2023-12-20 LAB — CBC
HCT: 36.4 % (ref 36.0–46.0)
Hemoglobin: 11.9 g/dL — ABNORMAL LOW (ref 12.0–15.0)
MCH: 26.7 pg (ref 26.0–34.0)
MCHC: 32.7 g/dL (ref 30.0–36.0)
MCV: 81.6 fL (ref 80.0–100.0)
Platelets: 316 K/uL (ref 150–400)
RBC: 4.46 MIL/uL (ref 3.87–5.11)
RDW: 14.4 % (ref 11.5–15.5)
WBC: 6 K/uL (ref 4.0–10.5)
nRBC: 0 % (ref 0.0–0.2)

## 2023-12-20 LAB — TROPONIN I (HIGH SENSITIVITY)
Troponin I (High Sensitivity): 5 ng/L (ref <18)
Troponin I (High Sensitivity): 6 ng/L (ref <18)

## 2023-12-20 LAB — BASIC METABOLIC PANEL WITH GFR
Anion gap: 9 (ref 5–15)
BUN: 13 mg/dL (ref 8–23)
CO2: 24 mmol/L (ref 22–32)
Calcium: 9.1 mg/dL (ref 8.9–10.3)
Chloride: 104 mmol/L (ref 98–111)
Creatinine, Ser: 0.98 mg/dL (ref 0.44–1.00)
GFR, Estimated: >60 mL/min (ref >60)
Glucose, Bld: 108 mg/dL — ABNORMAL HIGH (ref 70–99)
Potassium: 4.2 mmol/L (ref 3.5–5.1)
Sodium: 137 mmol/L (ref 135–145)

## 2023-12-20 LAB — MRSA NEXT GEN BY PCR, NASAL: MRSA by PCR Next Gen: NOT DETECTED

## 2023-12-20 LAB — HIV ANTIBODY (ROUTINE TESTING W REFLEX): HIV Screen 4th Generation wRfx: NONREACTIVE

## 2023-12-20 MED ORDER — ACETAMINOPHEN 325 MG PO TABS
650.0000 mg | ORAL_TABLET | Freq: Four times a day (QID) | ORAL | Status: DC | PRN
  Administered 2023-12-20 (×2): 650 mg via ORAL
  Filled 2023-12-20 (×2): qty 2

## 2023-12-20 MED ORDER — GABAPENTIN 300 MG PO CAPS
600.0000 mg | ORAL_CAPSULE | Freq: Three times a day (TID) | ORAL | Status: DC
Start: 2023-12-20 — End: 2023-12-22
  Administered 2023-12-20 – 2023-12-22 (×7): 600 mg via ORAL
  Filled 2023-12-20 (×7): qty 2

## 2023-12-20 MED ORDER — MAGNESIUM SULFATE 2 GM/50ML IV SOLN
2.0000 g | Freq: Once | INTRAVENOUS | Status: AC
  Administered 2023-12-20: 2 g via INTRAVENOUS
  Filled 2023-12-20: qty 50

## 2023-12-20 MED ORDER — EZETIMIBE 10 MG PO TABS
10.0000 mg | ORAL_TABLET | Freq: Every day | ORAL | Status: DC
  Administered 2023-12-20 – 2023-12-22 (×3): 10 mg via ORAL
  Filled 2023-12-20 (×3): qty 1

## 2023-12-20 MED ORDER — PANTOPRAZOLE SODIUM 40 MG PO TBEC
40.0000 mg | DELAYED_RELEASE_TABLET | Freq: Every day | ORAL | Status: DC
  Administered 2023-12-20 – 2023-12-22 (×3): 40 mg via ORAL
  Filled 2023-12-20 (×3): qty 1

## 2023-12-20 MED ORDER — AMLODIPINE BESYLATE 5 MG PO TABS
5.0000 mg | ORAL_TABLET | Freq: Every day | ORAL | Status: DC
  Administered 2023-12-20 – 2023-12-22 (×3): 5 mg via ORAL
  Filled 2023-12-20 (×3): qty 1

## 2023-12-20 MED ORDER — ASPIRIN 81 MG PO TBEC
81.0000 mg | DELAYED_RELEASE_TABLET | Freq: Every day | ORAL | Status: DC
  Administered 2023-12-20 – 2023-12-22 (×3): 81 mg via ORAL
  Filled 2023-12-20 (×3): qty 1

## 2023-12-20 MED ORDER — METOPROLOL TARTRATE 25 MG PO TABS
12.5000 mg | ORAL_TABLET | Freq: Two times a day (BID) | ORAL | Status: DC
  Administered 2023-12-20 (×2): 12.5 mg via ORAL
  Filled 2023-12-20 (×2): qty 1

## 2023-12-20 MED ORDER — ACETAMINOPHEN 500 MG PO TABS
1000.0000 mg | ORAL_TABLET | Freq: Four times a day (QID) | ORAL | Status: DC | PRN
  Administered 2023-12-20: 1000 mg via ORAL
  Filled 2023-12-20: qty 2

## 2023-12-20 MED ORDER — ZOLPIDEM TARTRATE 5 MG PO TABS
5.0000 mg | ORAL_TABLET | Freq: Every evening | ORAL | Status: DC | PRN
  Administered 2023-12-20: 2.5 mg via ORAL
  Administered 2023-12-21: 5 mg via ORAL
  Filled 2023-12-20 (×2): qty 1

## 2023-12-20 MED ORDER — FAMOTIDINE 20 MG PO TABS
20.0000 mg | ORAL_TABLET | Freq: Every day | ORAL | Status: DC
  Administered 2023-12-20 – 2023-12-21 (×2): 20 mg via ORAL
  Filled 2023-12-20 (×2): qty 1

## 2023-12-20 NOTE — Progress Notes (Signed)
 Progress Note  Patient Name: Amanda Waters Date of Encounter: 12/20/2023  Primary Cardiologist: Candyce Reek, MD  Subjective   No symptoms.  Chest pain resolved.  Inpatient Medications    Scheduled Meds:  amLODipine   5 mg Oral Daily   aspirin  EC  81 mg Oral Daily   ezetimibe   10 mg Oral Daily   gabapentin   600 mg Oral TID   pantoprazole   40 mg Oral Daily   Continuous Infusions:  PRN Meds: acetaminophen , LORazepam , zolpidem    Vital Signs    Vitals:   12/20/23 0800 12/20/23 0900 12/20/23 1036 12/20/23 1100  BP: (!) 150/73 138/69  (!) 142/67  Pulse: 60 65  64  Resp: 15 10  15   Temp:   98.9 F (37.2 C)   TempSrc:      SpO2: 100% 100%  99%  Weight:      Height:       No intake or output data in the 24 hours ending 12/20/23 1135 Filed Weights   12/19/23 2014  Weight: 66 kg    Telemetry     Personally reviewed.  NSR.  ECG    NSR.  Physical Exam   GEN: No acute distress.   Neck: No JVD. Cardiac: RRR, no murmur, rub, or gallop.  Respiratory: Nonlabored. Clear to auscultation bilaterally. GI: Soft, nontender, bowel sounds present. MS: No edema; No deformity. Neuro:  Nonfocal. Psych: Alert and oriented x 3. Normal affect.  Labs    Chemistry Recent Labs  Lab 12/19/23 2016 12/20/23 0330  NA 136 137  K 3.7 4.2  CL 103 104  CO2 23 24  GLUCOSE 109* 108*  BUN 15 13  CREATININE 0.97 0.98  CALCIUM 9.1 9.1  GFRNONAA >60 >60  ANIONGAP 10 9     Hematology Recent Labs  Lab 12/19/23 2016 12/20/23 0330  WBC 6.1 6.0  RBC 4.13 4.46  HGB 11.1* 11.9*  HCT 34.1* 36.4  MCV 82.6 81.6  MCH 26.9 26.7  MCHC 32.6 32.7  RDW 14.2 14.4  PLT 304 316    Cardiac Enzymes Recent Labs  Lab 12/19/23 2016 12/19/23 2350 12/20/23 0330  TROPONINIHS 3 5 6     BNPNo results for input(s): BNP, PROBNP in the last 168 hours.   DDimerNo results for input(s): DDIMER in the last 168 hours.   Radiology    MR BRAIN WO CONTRAST Result Date:  12/19/2023 EXAM: MRI BRAIN WITHOUT CONTRAST 12/19/2023 11:21:08 PM TECHNIQUE: Multiplanar multisequence MRI of the head/brain was performed without the administration of intravenous contrast. COMPARISON: 10/14/2017 CLINICAL HISTORY: Neuro deficit, acute, stroke suspected. FINDINGS: BRAIN AND VENTRICLES: No acute infarct. No intracranial hemorrhage. No mass. No midline shift. No hydrocephalus. The sella is unremarkable. Normal flow voids. Multifocal hyperintense T2-weighted signal within the cerebral white matter, most commonly due to chronic small vessel disease. ORBITS: No acute abnormality. SINUSES AND MASTOIDS: No acute abnormality. BONES AND SOFT TISSUES: Normal marrow signal. No acute soft tissue abnormality. IMPRESSION: 1. No acute intracranial abnormality. 2. Multifocal hyperintense T2-weighted signal within the cerebral white matter, most commonly due to chronic small vessel disease. Electronically signed by: Franky Stanford MD 12/19/2023 11:31 PM EDT RP Workstation: [id number]   CT ANGIO HEAD NECK W WO CM Result Date: 12/19/2023 EXAM: CTA HEAD AND NECK WITH AND WITHOUT 12/19/2023 10:07:26 PM TECHNIQUE: CTA of the head and neck was performed with and without the administration of intravenous contrast. Multiplanar 2D and/or 3D reformatted images are provided for review. Automated exposure control, iterative reconstruction,  and/or weight based adjustment of the mA/kV was utilized to reduce the radiation dose to as low as reasonably achievable. Stenosis of the internal carotid arteries measured using NASCET criteria. COMPARISON: None available CLINICAL HISTORY: Neuro deficit, acute, stroke suspected. FINDINGS: CTA NECK: AORTIC ARCH AND ARCH VESSELS: No dissection or arterial injury. No significant stenosis of the brachiocephalic or subclavian arteries. CERVICAL CAROTID ARTERIES: No dissection, arterial injury, or hemodynamically significant stenosis by NASCET criteria. CERVICAL VERTEBRAL ARTERIES: No dissection,  arterial injury, or significant stenosis. LUNGS AND MEDIASTINUM: Unremarkable. SOFT TISSUES: No acute abnormality. BONES: No acute abnormality. CTA HEAD: ANTERIOR CIRCULATION: No significant stenosis of the internal carotid arteries. No significant stenosis of the anterior cerebral arteries. No significant stenosis of the middle cerebral arteries. No aneurysm. POSTERIOR CIRCULATION: No significant stenosis of the posterior cerebral arteries. No significant stenosis of the basilar artery. No significant stenosis of the vertebral arteries. No aneurysm. OTHER: No dural venous sinus thrombosis on this non-dedicated study. Mild chronic ischemic white matter changes. IMPRESSION: 1. No large vessel occlusion, hemodynamically significant stenosis, or aneurysm in the head or neck. 2. Mild chronic ischemic white matter changes. Electronically signed by: Franky Stanford MD 12/19/2023 10:23 PM EDT RP Workstation: [id number]   DG Chest 2 View Result Date: 12/19/2023 EXAM: 2 VIEW(S) XRAY OF THE CHEST 12/19/2023 08:46:00 PM COMPARISON: May 16 2017. CLINICAL HISTORY: cp. FINDINGS: LUNGS AND PLEURA: No focal pulmonary opacity. No pulmonary edema. No pleural effusion. No pneumothorax. HEART AND MEDIASTINUM: No acute abnormality of the cardiac and mediastinal silhouettes. BONES AND SOFT TISSUES: No acute osseous abnormality. IMPRESSION: 1. No acute process. Electronically signed by: Norman Gatlin MD 12/19/2023 08:49 PM EDT RP Workstation: [id number]     Assessment & Plan   Chest pain - Presented with chest pain radiating to her left neck, jaw and left arm.  She also had weakness/pain in her left leg at the same time.  Aspirin  partially relieved her chest pains. SL NTG made her feel oozy/dizzy. - EKG showed NSR, PACs, no ischemia. - Hs troponins within normal limits. - Chest pain resolved after arrival to the ER. - She underwent NM stress test on 12/10/2023 that showed no evidence of ischemia but small apical inferior  infarction, overall low risk study.  She has some coronary artery calcifications. -In addition, during her exercise Myoview , resting BP was 161/76 that was elevated to 242/103 mm Hg. her chest pains are likely secondary to poorly controlled HTN. - Echocardiogram pending today. - Plan for CT cardiac on Monday.   HTN, poorly controlled -Metoprolol  was started yesterday, she reported fatigue.  Discontinue metoprolol  and start amlodipine  5 mg once daily. NTG made her feel weak and dizzy, not a candidate for nitrates.   Signed, Diannah SHAUNNA Maywood, MD  12/20/2023, 11:35 AM

## 2023-12-20 NOTE — Progress Notes (Signed)
 Patient requesting Pepcid  at night, (in addition to Protonix  in a.m.) per normal home schedule. Paged Dr. Theoplis. See new orders.

## 2023-12-20 NOTE — H&P (Signed)
 Cardiology Admission History and Physical   Patient ID: Amanda Waters MRN: [id number]; DOB: Oct 09, 1955   Admission date: 12/19/2023  PCP:  Pura Lenis, MD   [facility] Providers Cardiologist:  Candyce Reek, MD       Chief Complaint: Chest pain  Patient Profile: Amanda Waters is a 68 y.o. female with hyperlipidemia, family history of premature CAD, palpitations who is being seen 12/20/2023 for the evaluation of chest pain.  History of Present Illness: Amanda Waters is a 68 y.o. female with hyperlipidemia, family history of premature CAD, palpitations who is being seen 12/20/2023 for the evaluation of chest pain.  Patient reports she had seen Dr. Arnetha on 7/16 for exercise-induced tachycardia, she does elderly group dance class and noted to have heart rate elevated this underwent nuclear stress test which came back abnormal showing small apical infarct, LAD calcifications, moderate, LVEF 73%, no evidence of ischemia. Wore a Holter monitor for palpitations, pending results.  Patient reports she started having sudden onset of chest pressure radiating to her left arm and jaw today at rest, also noted to have left arm numbness and left foot numbness at the same time, the symptoms improved after she took got aspirin  as well as nitroglycerin . She did not have any chest pain with the dancing or exertion, she also has a multitude of symptoms.  Anxious, feels a lot of PCP HIDA echo she had low heart rate and felt very pale during her dance class but this recovered and never had any exertional pain or symptoms and never had syncope.  ER workup she was hypertensive blood pressure 150/68 mmHg. EKG shows normal sinus rhythm. Troponin x 2 negative. Currently chest pain-free.  Given she had tingling, numbness sensation in her left and leg CT scanning of hide was done-mild chronic ischemic white matter changes, no large vessel occlusion. MRI brain done shows no acute  intracranial abnormality, multifocal hyperintense T2 signal in the cerebral white matter most commonly due to chronic small vessel disease She is currently doing well, chest pain-free  Her workup on EKG on 12/03/2023 normal sinus rhythm. Last echo 2021 normal LVEF, no significant valvular abnormality.  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    Hyperlipidemia    IBS (irritable bowel syndrome)    Infection due to non-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-producing Escherichia coli (E.coli)    Iron deficiency anemia    Kidney infection    Kidney stones    Left radial fracture    Radial head fracture   Memory difficulty 12/16/2013   Occipital neuralgia    Osteopenia    Sleep apnea    Swollen lymph nodes    Past Surgical History:  Procedure Laterality Date   Broken Elbow Bilateral    CHOLECYSTECTOMY     COLONOSCOPY     CYSTECTOMY     DILATION AND CURETTAGE OF UTERUS     hammartoe  surgery Left    2nd toe   POLYPECTOMY     SHOULDER SURGERY     TONSILLECTOMY       Medications Prior to Admission: Prior to Admission medications   Medication Sig Start Date End Date Taking? Authorizing Provider  aspirin  EC 81 MG tablet Take 1 tablet (81 mg total) by mouth daily. Swallow whole. 12/15/23  Yes Chandrasekhar, Mahesh A, MD  Bacillus Coagulans-Inulin (ALIGN PREBIOTIC-PROBIOTIC PO) Take 1 capsule by mouth at bedtime. 09/18/23  Yes [provider]  Calcium  Carbonate-Vitamin D 600-400 MG-UNIT chew tablet Chew 1 tablet by mouth daily.   Yes [provider]  Cholecalciferol (WELL VITAMIN D3) 50 MCG (2000 UT) CAPS Take 2,000 Units by mouth daily. 05/20/17  Yes [provider]  ezetimibe  (ZETIA ) 10 MG tablet Take 1 tablet (10 mg total) by mouth daily. 12/03/23  Yes Chandrasekhar, Mahesh A, MD  famciclovir (FAMVIR) 500 MG tablet Take 500 mg by mouth daily.   Yes [provider]  famotidine  (PEPCID ) 20 MG tablet Take 20 mg by mouth at bedtime.   Yes [provider]  gabapentin  (NEURONTIN ) 600 MG tablet Take 1 tablet (600 mg total) by mouth 3 (three) times daily. 10/30/21  Yes Ines Onetha NOVAK, MD  Melatonin 10 MG CHEW Chew 10 mg by mouth at bedtime. 05/20/22  Yes [provider]  metoprolol  tartrate (LOPRESSOR ) 25 MG tablet Take 0.5 tablets (12.5 mg total) by mouth 2 (two) times daily. 12/15/23  Yes Chandrasekhar, Mahesh A, MD  Multiple Vitamins-Minerals (CENTRUM SILVER ADULT 50+ PO) Take 1 tablet by mouth daily.   Yes [provider]  Nutritional Supplements (DHEA PO) Take 5 mg by mouth daily.   Yes [provider]  Omega-3 Fatty Acids (FISH OIL) 1000 MG CAPS Take 1,000 mg by mouth every other day. 05/20/18  Yes [provider]  pantoprazole  (PROTONIX ) 40 MG tablet TAKE ONE TABLET BY MOUTH ONE TIME DAILY 11/24/23  Yes Beather Delon Gibson, PA  polyethylene glycol powder (GLYCOLAX/MIRALAX) 17 GM/SCOOP powder Take 17 g by mouth daily.   Yes [provider]  pyridOXINE (VITAMIN B6) 25 MG tablet Take 25 mg by mouth daily. 11/23/20  Yes [provider]  vitamin E 400 UNIT capsule Take 400 Units by mouth daily.   Yes [provider]  zolpidem  (AMBIEN ) 5 MG tablet Take 1 tablet (5 mg total) by mouth at bedtime as needed. for sleep Patient taking differently: Take 1.25 mg by mouth at bedtime as needed for sleep. for sleep 08/21/21  Yes Ines Onetha NOVAK, MD     Allergies:     Allergies  Allergen Reactions   Celebrex [Celecoxib] Hives   Darvon [Propoxyphene]     hallucinations   Duract [Bromfenac] Itching   Nitrofuran Derivatives     Headache    Oxycodone-Acetaminophen  Itching   Prednisone     Manic, SI and weepy when decreasing doses.   Sulfa Antibiotics Hives    Social History:   Social History   Socioeconomic History   Marital status: Married    Spouse name: Not on file   Number of children: 0   Years of education: 16   Highest education level: Not on file  Occupational History   Occupation: Network engineer    Comment: part time  Tobacco Use   Smoking status: Never   Smokeless tobacco: Never  Substance and Sexual Activity   Alcohol use: Yes    Comment: Consumes alcohol on occasion   Drug  use: No   Sexual activity: Not on file  Other Topics Concern   Not on file  Social History Narrative   Lives at home w/ her husband and dog   Patient drinks 1 cup of tea a day.    Patient is right handed.   Social Drivers of Corporate investment banker Strain: Low Risk  (06/25/2023)   Received from [facility]   Overall Financial Resource Strain (CARDIA)    Difficulty of Paying Living Expenses: Not hard at all  Food Insecurity: No Food Insecurity (06/25/2023)   Received from [facility]   Hunger Vital Sign    Within the past 12 months, you worried that your food would run out before you got the money to buy more.: Never true    Within the past 12 months, the food you bought just didn't last and you didn't have money to get more.: Never true  Transportation Needs: No Transportation Needs (06/25/2023)   Received from [facility] - Transportation    Lack of Transportation (Medical): No    Lack of Transportation (Non-Medical): No  Physical Activity: Inactive (06/25/2023)   Received from [facility]   Exercise Vital Sign    On average, how many days per week do you engage in moderate to strenuous exercise (like a brisk walk)?: 0 days     On average, how many minutes do you engage in exercise at this level?: 50 min  Stress: No Stress Concern Present (06/25/2023)   Received from [facility] - Occupational Stress Questionnaire    Feeling of Stress : Only a little  Social Connections: Socially Integrated (06/25/2023)   Received from [facility]   Social Network    How would you rate your social network (family, work, friends)?: Good participation with social networks  Intimate Partner Violence: Not At Risk (06/25/2023)   Received from [facility]   HITS    Over the last 12 months how often did your partner physically hurt you?: Never    Over the last 12 months how often did your partner insult you or talk down to you?: Never    Over the last 12 months how often did your partner threaten you with physical harm?: Never    Over the last 12 months how often did your partner scream or curse at you?: Never     Family History:   The patient's family history includes Colon polyps in her father and mother; Dementia in her father; Diabetes in her father; Fibromyalgia in her mother; Heart disease in her father; Thyroid cancer in her father. There is no history of Colon cancer, Esophageal cancer, Liver cancer, Pancreatic cancer, Stomach cancer, or Rectal cancer.    ROS:  Please see the history of present illness.  All other ROS reviewed and negative.     Physical Exam/Data: Vitals:   12/19/23 2014 12/19/23 2130 12/19/23 2230 12/19/23 2349  BP:  (!) 149/71 (!) 149/67   Pulse:  69 73 66  Resp:  18 16 18   Temp:    98.8 F (37.1 C)  SpO2:  100% 100% 100%  Weight: 66 kg     Height: 5' 5 (1.651 m)      No intake or output data in the 24 hours ending 12/20/23 0127    12/19/2023    8:14 PM 12/03/2023   10:56 AM 07/23/2023    2:28 PM  Last 3 Weights  Weight (lbs)  145 lb 8.1 oz 144 lb 12.8 oz 145 lb 8 oz  Weight (kg) 66 kg 65.681 kg 65.998 kg     Body mass index is 24.21 kg/m.   General:  Well nourished, well developed, in no acute distress HEENT: normal Neck: no JVD Vascular: No carotid bruits; Distal pulses 2+ bilaterally   Cardiac:  normal S1, S2; RRR; no murmur  Lungs:  clear to auscultation bilaterally, no wheezing, rhonchi or rales  Abd: soft, nontender, no hepatomegaly  Ext: no edema Musculoskeletal:  No deformities, BUE and BLE strength normal and equal Skin: warm and dry  Neuro:  CNs 2-12 intact, no focal abnormalities noted Psych:  Normal affect     Laboratory Data: High Sensitivity Troponin:   Recent Labs  Lab 12/19/23 2016 12/19/23 2350  TROPONINIHS 3 5      Chemistry Recent Labs  Lab 12/19/23 2016  NA 136  K 3.7  CL 103  CO2 23  GLUCOSE 109*  BUN 15  CREATININE 0.97  CALCIUM 9.1  GFRNONAA >60  ANIONGAP 10    No results for input(s): PROT, ALBUMIN, AST, ALT, ALKPHOS, BILITOT in the last 168 hours. Lipids No results for input(s): CHOL, TRIG, HDL, LABVLDL, LDLCALC, CHOLHDL in the last 168 hours. Hematology Recent Labs  Lab 12/19/23 2016  WBC 6.1  RBC 4.13  HGB 11.1*  HCT 34.1*  MCV 82.6  MCH 26.9  MCHC 32.6  RDW 14.2  PLT 304   Thyroid No results for input(s): TSH, FREET4 in the last 168 hours. BNPNo results for input(s): BNP, PROBNP in the last 168 hours.  DDimer No results for input(s): DDIMER in the last 168 hours.  Radiology/Studies:  MR BRAIN WO CONTRAST Result Date: 12/19/2023 EXAM: MRI BRAIN WITHOUT CONTRAST 12/19/2023 11:21:08 PM TECHNIQUE: Multiplanar multisequence MRI of the head/brain was performed without the administration of intravenous contrast. COMPARISON: 10/14/2017 CLINICAL HISTORY: Neuro deficit, acute, stroke suspected. FINDINGS: BRAIN AND VENTRICLES: No acute infarct. No intracranial hemorrhage. No mass. No midline shift. No hydrocephalus. The sella is unremarkable. Normal flow voids. Multifocal hyperintense T2-weighted signal within the cerebral white matter, most  commonly due to chronic small vessel disease. ORBITS: No acute abnormality. SINUSES AND MASTOIDS: No acute abnormality. BONES AND SOFT TISSUES: Normal marrow signal. No acute soft tissue abnormality. IMPRESSION: 1. No acute intracranial abnormality. 2. Multifocal hyperintense T2-weighted signal within the cerebral white matter, most commonly due to chronic small vessel disease. Electronically signed by: Franky Stanford MD 12/19/2023 11:31 PM EDT RP Workstation: [id number]   CT ANGIO HEAD NECK W WO CM Result Date: 12/19/2023 EXAM: CTA HEAD AND NECK WITH AND WITHOUT 12/19/2023 10:07:26 PM TECHNIQUE: CTA of the head and neck was performed with and without the administration of intravenous contrast. Multiplanar 2D and/or 3D reformatted images are provided for review. Automated exposure control, iterative reconstruction, and/or weight based adjustment of the mA/kV was utilized to reduce the radiation dose to as low as reasonably achievable. Stenosis of the internal carotid arteries measured using NASCET criteria. COMPARISON: None available CLINICAL HISTORY: Neuro deficit, acute, stroke suspected. FINDINGS: CTA NECK: AORTIC ARCH AND ARCH VESSELS: No dissection or arterial injury. No significant stenosis of the brachiocephalic or subclavian arteries. CERVICAL CAROTID ARTERIES: No dissection, arterial injury, or hemodynamically significant stenosis by NASCET criteria. CERVICAL VERTEBRAL ARTERIES: No dissection, arterial injury, or significant stenosis. LUNGS AND MEDIASTINUM: Unremarkable. SOFT TISSUES: No acute abnormality. BONES: No acute abnormality. CTA HEAD: ANTERIOR CIRCULATION: No significant stenosis of the internal carotid arteries. No significant stenosis of  the anterior cerebral arteries. No significant stenosis of the middle cerebral arteries. No aneurysm. POSTERIOR CIRCULATION: No significant stenosis of the posterior cerebral arteries. No significant stenosis of the basilar artery. No significant stenosis of the  vertebral arteries. No aneurysm. OTHER: No dural venous sinus thrombosis on this non-dedicated study. Mild chronic ischemic white matter changes. IMPRESSION: 1. No large vessel occlusion, hemodynamically significant stenosis, or aneurysm in the head or neck. 2. Mild chronic ischemic white matter changes. Electronically signed by: Franky Stanford MD 12/19/2023 10:23 PM EDT RP Workstation: [id number]   DG Chest 2 View Result Date: 12/19/2023 EXAM: 2 VIEW(S) XRAY OF THE CHEST 12/19/2023 08:46:00 PM COMPARISON: May 16 2017. CLINICAL HISTORY: cp. FINDINGS: LUNGS AND PLEURA: No focal pulmonary opacity. No pulmonary edema. No pleural effusion. No pneumothorax. HEART AND MEDIASTINUM: No acute abnormality of the cardiac and mediastinal silhouettes. BONES AND SOFT TISSUES: No acute osseous abnormality. IMPRESSION: 1. No acute process. Electronically signed by: Norman Gatlin MD 12/19/2023 08:49 PM EDT RP Workstation: [id number]     Assessment and Plan: Chest pain, typical and atypical symptoms. Abnormal nuclear stress test showing apical inferior infarct CT coronary calcification, moderate. Family history of premature CAD in brother Left arm numbness, left leg numbness, paresthesia likely from anxiety, MRI brain negative Tachycardia, Holter monitor workup underway,. Hypertension  Plan: --> Patient had sudden onset of chest pain today at rest, concerning, but also has magnitude of other symptoms, I do not believe this is unstable angina.  She never had any exertional chest pain preceding this, she also appears very anxious.  She does have coronary calcification and abnormal stress test, currently chest pain-free, EKG enzymes are negative, I would continue to trend troponin and ED EKG. I will get echocardiogram in a.m. Due to her risk factors of family history, abnormal stress test, chest pain, will probably proceed with cardiac catheterization on Monday. If she has further episode of chest pain with a low  threshold to start heparin. Continue home medication of aspirin , metoprolol , statin statin, ezetimibe .  Full code  Risk Assessment/Risk Scores:        Code Status: Full Code  Severity of Illness: The appropriate patient status for this patient is INPATIENT. Inpatient status is judged to be reasonable and necessary in order to provide the required intensity of service to ensure the patient's safety. The patient's presenting symptoms, physical exam findings, and initial radiographic and laboratory data in the context of their chronic comorbidities is felt to place them at high risk for further clinical deterioration. Furthermore, it is not anticipated that the patient will be medically stable for discharge from the hospital within 2 midnights of admission.   * I certify that at the point of admission it is my clinical judgment that the patient will require inpatient hospital care spanning beyond 2 midnights from the point of admission due to high intensity of service, high risk for further deterioration and high frequency of surveillance required.*  For questions or updates, please contact [facility] Please consult [email or web address] for contact info under     Signed, Grayce Bold, MD  12/20/2023 1:27 AM

## 2023-12-20 NOTE — Progress Notes (Signed)
 Patient admitted to 2c-09, CHG and admission questions completed, patient refused SCDs and education given. Patient ambulates independently in room.

## 2023-12-20 NOTE — Plan of Care (Signed)

## 2023-12-20 NOTE — Progress Notes (Addendum)
 Patient stated that her gastroenterologist told her that she is supposed to take Protonix  in the morning and Pepcid  at night. Patient requested her pepcid  for indigestion. I paged on call MD, [place] with no response at [phone].

## 2023-12-21 ENCOUNTER — Inpatient Hospital Stay (HOSPITAL_COMMUNITY)

## 2023-12-21 DIAGNOSIS — R079 Chest pain, unspecified: Secondary | ICD-10-CM | POA: Diagnosis not present

## 2023-12-21 LAB — ECHOCARDIOGRAM COMPLETE
AR max vel: 2.55 cm2
AV Area VTI: 2.78 cm2
AV Area mean vel: 2.54 cm2
AV Mean grad: 4 mmHg
AV Peak grad: 8 mmHg
Ao pk vel: 1.41 m/s
Area-P 1/2: 3.45 cm2
Calc EF: 63.8 %
Height: 65 in
S' Lateral: 2.3 cm
Single Plane A2C EF: 64 %
Single Plane A4C EF: 62.3 %
Weight: 2300.8 [oz_av]

## 2023-12-21 NOTE — Progress Notes (Addendum)
 Progress Note  Patient Name: Amanda Waters Date of Encounter: 12/21/2023  Primary Cardiologist: Candyce Reek, MD  Subjective   No symptoms.  Has very mild chest pain.  Inpatient Medications    Scheduled Meds:  amLODipine   5 mg Oral Daily   aspirin  EC  81 mg Oral Daily   ezetimibe   10 mg Oral Daily   famotidine   20 mg Oral QHS   gabapentin   600 mg Oral TID   pantoprazole   40 mg Oral Daily   Continuous Infusions:  PRN Meds: acetaminophen , LORazepam , zolpidem    Vital Signs    Vitals:   12/20/23 1953 12/20/23 2304 12/21/23 0415 12/21/23 0800  BP: (!) 125/49  125/62 128/63  Pulse: 70 71 67 69  Resp: 16 16 17 17   Temp: 98.2 F (36.8 C) 97.8 F (36.6 C) 97.9 F (36.6 C) 98.3 F (36.8 C)  TempSrc: Oral Axillary Oral Oral  SpO2: 97%  97% 98%  Weight:      Height:        Intake/Output Summary (Last 24 hours) at 12/21/2023 1017 Last data filed at 12/21/2023 0711 Gross per 24 hour  Intake 45.39 ml  Output 3 ml  Net 42.39 ml   Filed Weights   12/19/23 2014 12/20/23 1600  Weight: 66 kg 65.2 kg    Telemetry     Personally reviewed.  NSR.  ECG    NSR.  Physical Exam   GEN: No acute distress.   Neck: No JVD. Cardiac: RRR, no murmur, rub, or gallop.  Respiratory: Nonlabored. Clear to auscultation bilaterally. GI: Soft, nontender, bowel sounds present. MS: No edema; No deformity. Neuro:  Nonfocal. Psych: Alert and oriented x 3. Normal affect.  Labs    Chemistry Recent Labs  Lab 12/19/23 2016 12/20/23 0330  NA 136 137  K 3.7 4.2  CL 103 104  CO2 23 24  GLUCOSE 109* 108*  BUN 15 13  CREATININE 0.97 0.98  CALCIUM 9.1 9.1  GFRNONAA >60 >60  ANIONGAP 10 9     Hematology Recent Labs  Lab 12/19/23 2016 12/20/23 0330  WBC 6.1 6.0  RBC 4.13 4.46  HGB 11.1* 11.9*  HCT 34.1* 36.4  MCV 82.6 81.6  MCH 26.9 26.7  MCHC 32.6 32.7  RDW 14.2 14.4  PLT 304 316    Cardiac Enzymes Recent Labs  Lab 12/19/23 2016 12/19/23 2350  12/20/23 0330  TROPONINIHS 3 5 6     BNPNo results for input(s): BNP, PROBNP in the last 168 hours.   DDimerNo results for input(s): DDIMER in the last 168 hours.   Radiology    MR BRAIN WO CONTRAST Result Date: 12/19/2023 EXAM: MRI BRAIN WITHOUT CONTRAST 12/19/2023 11:21:08 PM TECHNIQUE: Multiplanar multisequence MRI of the head/brain was performed without the administration of intravenous contrast. COMPARISON: 10/14/2017 CLINICAL HISTORY: Neuro deficit, acute, stroke suspected. FINDINGS: BRAIN AND VENTRICLES: No acute infarct. No intracranial hemorrhage. No mass. No midline shift. No hydrocephalus. The sella is unremarkable. Normal flow voids. Multifocal hyperintense T2-weighted signal within the cerebral white matter, most commonly due to chronic small vessel disease. ORBITS: No acute abnormality. SINUSES AND MASTOIDS: No acute abnormality. BONES AND SOFT TISSUES: Normal marrow signal. No acute soft tissue abnormality. IMPRESSION: 1. No acute intracranial abnormality. 2. Multifocal hyperintense T2-weighted signal within the cerebral white matter, most commonly due to chronic small vessel disease. Electronically signed by: Franky Stanford MD 12/19/2023 11:31 PM EDT RP Workstation: [id number]   CT ANGIO HEAD NECK W WO CM Result Date: 12/19/2023  EXAM: CTA HEAD AND NECK WITH AND WITHOUT 12/19/2023 10:07:26 PM TECHNIQUE: CTA of the head and neck was performed with and without the administration of intravenous contrast. Multiplanar 2D and/or 3D reformatted images are provided for review. Automated exposure control, iterative reconstruction, and/or weight based adjustment of the mA/kV was utilized to reduce the radiation dose to as low as reasonably achievable. Stenosis of the internal carotid arteries measured using NASCET criteria. COMPARISON: None available CLINICAL HISTORY: Neuro deficit, acute, stroke suspected. FINDINGS: CTA NECK: AORTIC ARCH AND ARCH VESSELS: No dissection or arterial injury. No  significant stenosis of the brachiocephalic or subclavian arteries. CERVICAL CAROTID ARTERIES: No dissection, arterial injury, or hemodynamically significant stenosis by NASCET criteria. CERVICAL VERTEBRAL ARTERIES: No dissection, arterial injury, or significant stenosis. LUNGS AND MEDIASTINUM: Unremarkable. SOFT TISSUES: No acute abnormality. BONES: No acute abnormality. CTA HEAD: ANTERIOR CIRCULATION: No significant stenosis of the internal carotid arteries. No significant stenosis of the anterior cerebral arteries. No significant stenosis of the middle cerebral arteries. No aneurysm. POSTERIOR CIRCULATION: No significant stenosis of the posterior cerebral arteries. No significant stenosis of the basilar artery. No significant stenosis of the vertebral arteries. No aneurysm. OTHER: No dural venous sinus thrombosis on this non-dedicated study. Mild chronic ischemic white matter changes. IMPRESSION: 1. No large vessel occlusion, hemodynamically significant stenosis, or aneurysm in the head or neck. 2. Mild chronic ischemic white matter changes. Electronically signed by: Franky Stanford MD 12/19/2023 10:23 PM EDT RP Workstation: [id number]   DG Chest 2 View Result Date: 12/19/2023 EXAM: 2 VIEW(S) XRAY OF THE CHEST 12/19/2023 08:46:00 PM COMPARISON: May 16 2017. CLINICAL HISTORY: cp. FINDINGS: LUNGS AND PLEURA: No focal pulmonary opacity. No pulmonary edema. No pleural effusion. No pneumothorax. HEART AND MEDIASTINUM: No acute abnormality of the cardiac and mediastinal silhouettes. BONES AND SOFT TISSUES: No acute osseous abnormality. IMPRESSION: 1. No acute process. Electronically signed by: Norman Gatlin MD 12/19/2023 08:49 PM EDT RP Workstation: [id number]     Assessment & Plan   Chest pain - Presented with chest pain radiating to her left neck, jaw and left arm.  She also had weakness/pain in her left leg at the same time.  Aspirin  partially relieved her chest pains. SL NTG made her feel oozy/dizzy. -  EKG showed NSR, PACs, no ischemia. - Hs troponins within normal limits. - Chest pain resolved after arrival to the ER only to recur again but very mild in intensity. - She underwent NM stress test on 12/10/2023 that showed no evidence of ischemia but small apical inferior infarction, overall low risk study.  She has some coronary artery calcifications. -In addition, during her exercise Myoview , resting BP was 161/76 that was elevated to 242/103 mm Hg. her chest pains are likely secondary to poorly controlled HTN. - Echocardiogram pending today. - Plan for CT cardiac on Monday.   HTN, poorly controlled -Metoprolol  was started on admission, she reported fatigue.  Metoprolol  discontinued and amlodipine  was started. NTG made her feel weak and dizzy, not a candidate for nitrates.   Signed, Diannah SHAUNNA Maywood, MD  12/21/2023, 10:17 AM

## 2023-12-22 ENCOUNTER — Encounter: Payer: Self-pay | Admitting: Internal Medicine

## 2023-12-22 ENCOUNTER — Inpatient Hospital Stay (HOSPITAL_COMMUNITY)

## 2023-12-22 DIAGNOSIS — I2510 Atherosclerotic heart disease of native coronary artery without angina pectoris: Secondary | ICD-10-CM | POA: Diagnosis not present

## 2023-12-22 DIAGNOSIS — E7800 Pure hypercholesterolemia, unspecified: Secondary | ICD-10-CM

## 2023-12-22 DIAGNOSIS — I700 Atherosclerosis of aorta: Secondary | ICD-10-CM

## 2023-12-22 DIAGNOSIS — R931 Abnormal findings on diagnostic imaging of heart and coronary circulation: Secondary | ICD-10-CM

## 2023-12-22 DIAGNOSIS — R002 Palpitations: Secondary | ICD-10-CM

## 2023-12-22 DIAGNOSIS — R072 Precordial pain: Secondary | ICD-10-CM

## 2023-12-22 MED ORDER — AMLODIPINE BESYLATE 5 MG PO TABS: 5.0000 mg | ORAL_TABLET | Freq: Every day | ORAL | 6 refills | Status: DC

## 2023-12-22 MED ORDER — NITROGLYCERIN 0.4 MG SL SUBL
SUBLINGUAL_TABLET | SUBLINGUAL | Status: AC
  Filled 2023-12-22: qty 2

## 2023-12-22 MED ORDER — NITROGLYCERIN 0.4 MG SL SUBL
0.8000 mg | SUBLINGUAL_TABLET | Freq: Once | SUBLINGUAL | Status: AC
  Administered 2023-12-22: 0.8 mg via SUBLINGUAL

## 2023-12-22 MED ORDER — VALACYCLOVIR HCL 500 MG PO TABS
1000.0000 mg | ORAL_TABLET | Freq: Every day | ORAL | Status: DC
  Filled 2023-12-22: qty 2

## 2023-12-22 MED ORDER — NITROGLYCERIN 0.4 MG SL SUBL: 0.4000 mg | SUBLINGUAL_TABLET | SUBLINGUAL | 3 refills | Status: AC | PRN

## 2023-12-22 MED ORDER — IOHEXOL 350 MG/ML SOLN
100.0000 mL | Freq: Once | INTRAVENOUS | Status: AC | PRN
  Administered 2023-12-22: 100 mL via INTRAVENOUS

## 2023-12-22 NOTE — Discharge Summary (Addendum)
 Discharge Summary   Patient ID: Amanda Waters MRN: [id number]; DOB: [birth year]  Admit date: 12/19/2023 Discharge date: December 23, 2023  PCP:  Pura Lenis, MD   [facility] Providers Cardiologist:  Stanly DELENA Leavens, MD   Discharge Diagnoses  Principal Problem:   Chest pain   Diagnostic Studies/Procedures  Coronary CTA 23-Dec-2023 1. Coronary calcium score of 132. This was 84th percentile for age-, sex, and race-matched controls.   2. Total plaque volume 1108mm3 which is 55th percentile for age- and sex-matched controls (calcified plaque 67mm3; non-calcified plaque 139mm3). TPV is moderate   3.  Normal coronary origin with right dominance.   4. Mild (25-49%) stenosis in mid LAD, proximal LCX, and proximal/mid RCA   5.  Minimal (0-24%) stenosis in proximal LAD   RECOMMENDATIONS: CAD-RADS 2: Mild non-obstructive CAD (25-49%). Consider non-atherosclerotic causes of chest pain. Consider preventive therapy and risk factor modification.  Echocardiogram 12/21/2023 1. Left ventricular ejection fraction, by estimation, is 60 to 65%. The  left ventricle has normal function. The left ventricle has no regional  wall motion abnormalities. Left ventricular diastolic parameters are  consistent with Grade I diastolic  dysfunction (impaired relaxation).   2. Right ventricular systolic function is normal. The right ventricular  size is normal.   3. The mitral valve is normal in structure. No evidence of mitral valve  regurgitation. No evidence of mitral stenosis.   4. The aortic valve is normal in structure. Aortic valve regurgitation is  not visualized. No aortic stenosis is present.   5. The inferior vena cava is normal in size with greater than 50%  respiratory variability, suggesting right atrial pressure of 3 mmHg.   Comparison(s): No significant change from prior study.  _____________   History of Present Illness   Amanda Waters is a 68 y.o. female with  exercise-induced tachycardia, aortic atherosclerosis, hyperlipidemia, familial history of CAD, central sleep apnea.  Patient with history of exercise-induced tachycardia.  Heart monitor was ordered at last office visit 11/2023 but never completed.  Lexiscan in the same month demonstrated small apical inferior/LAD infarct but felt to be low risk.    Patient then presented on 8/2 complaining of chest pressure with radiation to her left jaw and arm.  Also with vague symptoms of left foot numbness.  Symptoms improved with nitroglycerin .  Denied exertional chest pain.  Reportedly very anxious.  Troponins were negative.  EKG with no acute ST-T wave changes.  Brain MRI was negative for any acute pathology.   Hospital Course   Consultants:    Chest pain Strong family history of CAD with brother who had MI at the age of 62.  History of recent stress test demonstrating possible LAD infarct but felt to be low risk.  She presented with chest pain with radiation with other vague complaints.  Pain relieved with nitroglycerin .  EKG with no acute ST-T wave changes.  Troponins negative x 2.  Echocardiogram was normal.  Underwent CCTA demonstrating mild nonobstructive disease in the mid LAD, proximal LCx, proximal/mid RCA. Continue with aspirin , Zetia , amlodipine  5 mg. Reportedly lipid panel scheduled for October.  She has fibromyalgia so did not want to trial a statin.  If significantly elevated may need to consider PCSK9 inhibitor. Will ensure that she has nitroglycerin  at home.  Exercise induced tachycardia Heart monitor ordered but never completed.  No arrhythmias noted throughout this hospitalization.  Has a Apple watch instead.  Patient seen and examined by Dr. Francyne deemed stable for discharge.  Follow-up has  been arranged.  Lopressor  12.5 mg twice daily was stopped during this admission due to fatigue.  Amlodipine  5 mg started.      Did the patient have an acute coronary syndrome (MI, NSTEMI, STEMI,  etc) this admission?:  No                               Did the patient have a percutaneous coronary intervention (stent / angioplasty)?:  No.    _____________  Discharge Vitals Blood pressure 103/61, pulse 80, temperature 98.1 F (36.7 C), temperature source Oral, resp. rate 19, height 5' 5 (1.651 m), weight 65.2 kg, SpO2 97%.  Filed Weights   12/19/23 2014 12/20/23 1600  Weight: 66 kg 65.2 kg    Labs & Radiologic Studies  CBC Recent Labs    12/19/23 2016 12/20/23 0330  WBC 6.1 6.0  HGB 11.1* 11.9*  HCT 34.1* 36.4  MCV 82.6 81.6  PLT 304 316   Basic Metabolic Panel Recent Labs    91/98/74 2016 12/20/23 0330  NA 136 137  K 3.7 4.2  CL 103 104  CO2 23 24  GLUCOSE 109* 108*  BUN 15 13  CREATININE 0.97 0.98  CALCIUM 9.1 9.1   Liver Function Tests No results for input(s): AST, ALT, ALKPHOS, BILITOT, PROT, ALBUMIN in the last 72 hours. No results for input(s): LIPASE, AMYLASE in the last 72 hours. High Sensitivity Troponin:   Recent Labs  Lab 12/19/23 2016 12/19/23 2350 12/20/23 0330  TROPONINIHS 3 5 6     No results for input(s): TRNPT in the last 720 hours.  BNP Invalid input(s): POCBNP No results for input(s): PROBNP in the last 72 hours.  No results for input(s): BNP in the last 72 hours.  D-Dimer No results for input(s): DDIMER in the last 72 hours. Hemoglobin A1C No results for input(s): HGBA1C in the last 72 hours. Fasting Lipid Panel No results for input(s): CHOL, HDL, LDLCALC, TRIG, CHOLHDL, LDLDIRECT in the last 72 hours. No results found for: LIPOA  Thyroid Function Tests No results for input(s): TSH, T4TOTAL, T3FREE, THYROIDAB in the last 72 hours.  Invalid input(s): FREET3 _____________  CT CORONARY MORPH W/CTA COR W/SCORE W/CA W/CM &/OR WO/CM Result Date: 12/22/2023 CLINICAL DATA:  Chest pain EXAM: Cardiac/Coronary CTA TECHNIQUE: A non-contrast, gated CT scan was obtained with axial slices  of 2.5 mm through the heart for calcium scoring. Calcium scoring was performed using the Agatston method. A 120 kV prospective, gated, contrast cardiac CT scan was obtained. Gantry rotation speed was 230 msec and collimation was 0.63 mm. Two sublingual nitroglycerin  tablets (0.8 mg) were given. The 3D data set was reconstructed with motion correction for the best systolic or diastolic phase. Images were analyzed on a dedicated workstation using MPR, MIP, and VRT modes. The patient received 95 cc of contrast. FINDINGS: Image quality: Excellent. Noise artifact is: Limited. Coronary Arteries:  Normal coronary origin.  Right dominance. Left main: The left main is a large caliber vessel with a normal take off from the left coronary cusp that bifurcates to form a left anterior descending artery and a left circumflex artery. Left anterior descending artery: The LAD is patent. the LAD gives off 3 patent diagonal branches. Calcified plaque in proximal LAD causes 0-24% stenosis. Mixed plaque in mid LAD causes 25-49% stenosis. Left circumflex artery: The LCX is non-dominant and patent. The LCX gives off 2 patent obtuse marginal branches. Noncalcified plaque in proximal LCX  causes 24-49% stenosis. Right coronary artery: The RCA is dominant with normal take off from the right coronary cusp. The RCA terminates as a PDA and right posterolateral branch. Mixed plaque in proximal RCA causes 25-49% stenosis. Mixed plaque in mid RCA causes 25-49% stenosis Right Atrium: Right atrial size is within normal limits. Right Ventricle: The right ventricular cavity is within normal limits. Left Atrium: Left atrial size is normal in size with no left atrial appendage filling defect. Left Ventricle: The ventricular cavity size is within normal limits. Pulmonary arteries: Normal in size. Pulmonary veins: Normal pulmonary venous drainage. Pericardium: Normal thickness without significant effusion or calcium present. Cardiac valves: The aortic valve  is trileaflet without significant calcification. The mitral valve is normal without significant calcification. Aorta: Normal caliber without significant disease. Extra-cardiac findings: See attached radiology report for non-cardiac structures. IMPRESSION: 1. Coronary calcium score of 132. This was 84th percentile for age-, sex, and race-matched controls. 2. Total plaque volume 183mm3 which is 55th percentile for age- and sex-matched controls (calcified plaque 78mm3; non-calcified plaque 136mm3). TPV is moderate 3.  Normal coronary origin with right dominance. 4. Mild (25-49%) stenosis in mid LAD, proximal LCX, and proximal/mid RCA 5.  Minimal (0-24%) stenosis in proximal LAD RECOMMENDATIONS: CAD-RADS 2: Mild non-obstructive CAD (25-49%). Consider non-atherosclerotic causes of chest pain. Consider preventive therapy and risk factor modification. Electronically Signed   By: Lonni Nanas M.D.   On: 12/22/2023 13:42   ECHOCARDIOGRAM COMPLETE Result Date: 12/21/2023    ECHOCARDIOGRAM REPORT   Patient Name:   Amanda Waters Waltrip Date of Exam: 12/21/2023 Medical Rec #:  [id number]       Height:       65.0 in Accession #:    [id number]      Weight:       143.8 lb Date of Birth:  [birth year]      BSA:          1.719 m Patient Age:    67 years        BP:           115/78 mmHg Patient Gender: F               HR:           70 bpm. Exam Location:  Inpatient Procedure: 2D Echo, Cardiac Doppler and Color Doppler (Both Spectral and Color            Flow Doppler were utilized during procedure). Indications:    Chest Pain  History:        Patient has prior history of Echocardiogram examinations, most                 recent 07/30/2019. Risk Factors:Dyslipidemia and Sleep Apnea.  Sonographer:    Therisa Crouch Referring Phys: [id number] ROBIN FERNANDES IMPRESSIONS  1. Left ventricular ejection fraction, by estimation, is 60 to 65%. The left ventricle has normal function. The left ventricle has no regional wall motion abnormalities. Left  ventricular diastolic parameters are consistent with Grade I diastolic dysfunction (impaired relaxation).  2. Right ventricular systolic function is normal. The right ventricular size is normal.  3. The mitral valve is normal in structure. No evidence of mitral valve regurgitation. No evidence of mitral stenosis.  4. The aortic valve is normal in structure. Aortic valve regurgitation is not visualized. No aortic stenosis is present.  5. The inferior vena cava is normal in size with greater than 50% respiratory variability, suggesting right atrial pressure of 3 mmHg.  Comparison(s): No significant change from prior study. FINDINGS  Left Ventricle: Left ventricular ejection fraction, by estimation, is 60 to 65%. The left ventricle has normal function. The left ventricle has no regional wall motion abnormalities. Strain was performed and the global longitudinal strain is indeterminate. The left ventricular internal cavity size was normal in size. There is no left ventricular hypertrophy. Left ventricular diastolic parameters are consistent with Grade I diastolic dysfunction (impaired relaxation). Normal left ventricular filling pressure. Right Ventricle: The right ventricular size is normal. No increase in right ventricular wall thickness. Right ventricular systolic function is normal. Left Atrium: Left atrial size was normal in size. Right Atrium: Right atrial size was normal in size. Pericardium: There is no evidence of pericardial effusion. Mitral Valve: The mitral valve is normal in structure. No evidence of mitral valve regurgitation. No evidence of mitral valve stenosis. Tricuspid Valve: The tricuspid valve is normal in structure. Tricuspid valve regurgitation is not demonstrated. No evidence of tricuspid stenosis. Aortic Valve: The aortic valve is normal in structure. Aortic valve regurgitation is not visualized. No aortic stenosis is present. Aortic valve mean gradient measures 4.0 mmHg. Aortic valve peak  gradient measures 8.0 mmHg. Aortic valve area, by VTI measures 2.78 cm. Pulmonic Valve: The pulmonic valve was normal in structure. Pulmonic valve regurgitation is not visualized. No evidence of pulmonic stenosis. Aorta: The aortic root is normal in size and structure. Venous: The inferior vena cava is normal in size with greater than 50% respiratory variability, suggesting right atrial pressure of 3 mmHg. IAS/Shunts: No atrial level shunt detected by color flow Doppler. Additional Comments: 3D was performed not requiring image post processing on an independent workstation and was indeterminate.  LEFT VENTRICLE PLAX 2D LVIDd:         3.60 cm     Diastology LVIDs:         2.30 cm     LV e' medial:    7.18 cm/s LV PW:         1.10 cm     LV E/e' medial:  9.9 LV IVS:        0.90 cm     LV e' lateral:   11.30 cm/s LVOT diam:     2.10 cm     LV E/e' lateral: 6.3 LV SV:         75 LV SV Index:   44 LVOT Area:     3.46 cm  LV Volumes (MOD) LV vol d, MOD A2C: 51.7 ml LV vol d, MOD A4C: 74.5 ml LV vol s, MOD A2C: 18.6 ml LV vol s, MOD A4C: 28.1 ml LV SV MOD A2C:     33.1 ml LV SV MOD A4C:     74.5 ml LV SV MOD BP:      40.4 ml RIGHT VENTRICLE             IVC RV Basal diam:  2.40 cm     IVC diam: 1.60 cm RV S prime:     11.00 cm/s TAPSE (M-mode): 2.2 cm LEFT ATRIUM             Index LA diam:        2.70 cm 1.57 cm/m LA Vol (A2C):   23.1 ml 13.44 ml/m LA Vol (A4C):   39.3 ml 22.86 ml/m LA Biplane Vol: 31.2 ml 18.15 ml/m  AORTIC VALVE AV Area (Vmax):    2.55 cm AV Area (Vmean):   2.54 cm AV Area (VTI):  2.78 cm AV Vmax:           141.00 cm/s AV Vmean:          92.700 cm/s AV VTI:            0.270 m AV Peak Grad:      8.0 mmHg AV Mean Grad:      4.0 mmHg LVOT Vmax:         104.00 cm/s LVOT Vmean:        68.100 cm/s LVOT VTI:          0.217 m LVOT/AV VTI ratio: 0.80  AORTA Ao Root diam: 3.00 cm Ao Asc diam:  2.60 cm MITRAL VALVE MV Area (PHT): 3.45 cm    SHUNTS MV Decel Time: 220 msec    Systemic VTI:  0.22 m MV E  velocity: 71.30 cm/s  Systemic Diam: 2.10 cm MV A velocity: 93.30 cm/s MV E/A ratio:  0.76 Vishnu Priya Mallipeddi Electronically signed by Diannah Late Mallipeddi Signature Date/Time: 12/21/2023/3:49:48 PM    Final    MR BRAIN WO CONTRAST Result Date: 12/19/2023 EXAM: MRI BRAIN WITHOUT CONTRAST 12/19/2023 11:21:08 PM TECHNIQUE: Multiplanar multisequence MRI of the head/brain was performed without the administration of intravenous contrast. COMPARISON: 10/14/2017 CLINICAL HISTORY: Neuro deficit, acute, stroke suspected. FINDINGS: BRAIN AND VENTRICLES: No acute infarct. No intracranial hemorrhage. No mass. No midline shift. No hydrocephalus. The sella is unremarkable. Normal flow voids. Multifocal hyperintense T2-weighted signal within the cerebral white matter, most commonly due to chronic small vessel disease. ORBITS: No acute abnormality. SINUSES AND MASTOIDS: No acute abnormality. BONES AND SOFT TISSUES: Normal marrow signal. No acute soft tissue abnormality. IMPRESSION: 1. No acute intracranial abnormality. 2. Multifocal hyperintense T2-weighted signal within the cerebral white matter, most commonly due to chronic small vessel disease. Electronically signed by: Franky Stanford MD 12/19/2023 11:31 PM EDT RP Workstation: [id number]   CT ANGIO HEAD NECK W WO CM Result Date: 12/19/2023 EXAM: CTA HEAD AND NECK WITH AND WITHOUT 12/19/2023 10:07:26 PM TECHNIQUE: CTA of the head and neck was performed with and without the administration of intravenous contrast. Multiplanar 2D and/or 3D reformatted images are provided for review. Automated exposure control, iterative reconstruction, and/or weight based adjustment of the mA/kV was utilized to reduce the radiation dose to as low as reasonably achievable. Stenosis of the internal carotid arteries measured using NASCET criteria. COMPARISON: None available CLINICAL HISTORY: Neuro deficit, acute, stroke suspected. FINDINGS: CTA NECK: AORTIC ARCH AND ARCH VESSELS: No dissection  or arterial injury. No significant stenosis of the brachiocephalic or subclavian arteries. CERVICAL CAROTID ARTERIES: No dissection, arterial injury, or hemodynamically significant stenosis by NASCET criteria. CERVICAL VERTEBRAL ARTERIES: No dissection, arterial injury, or significant stenosis. LUNGS AND MEDIASTINUM: Unremarkable. SOFT TISSUES: No acute abnormality. BONES: No acute abnormality. CTA HEAD: ANTERIOR CIRCULATION: No significant stenosis of the internal carotid arteries. No significant stenosis of the anterior cerebral arteries. No significant stenosis of the middle cerebral arteries. No aneurysm. POSTERIOR CIRCULATION: No significant stenosis of the posterior cerebral arteries. No significant stenosis of the basilar artery. No significant stenosis of the vertebral arteries. No aneurysm. OTHER: No dural venous sinus thrombosis on this non-dedicated study. Mild chronic ischemic white matter changes. IMPRESSION: 1. No large vessel occlusion, hemodynamically significant stenosis, or aneurysm in the head or neck. 2. Mild chronic ischemic white matter changes. Electronically signed by: Franky Stanford MD 12/19/2023 10:23 PM EDT RP Workstation: [id number]   DG Chest 2 View Result Date: 12/19/2023 EXAM: 2 VIEW(S) XRAY OF THE CHEST  12/19/2023 08:46:00 PM COMPARISON: May 16 2017. CLINICAL HISTORY: cp. FINDINGS: LUNGS AND PLEURA: No focal pulmonary opacity. No pulmonary edema. No pleural effusion. No pneumothorax. HEART AND MEDIASTINUM: No acute abnormality of the cardiac and mediastinal silhouettes. BONES AND SOFT TISSUES: No acute osseous abnormality. IMPRESSION: 1. No acute process. Electronically signed by: Norman Gatlin MD 12/19/2023 08:49 PM EDT RP Workstation: [id number]   MYOCARDIAL PERFUSION/CT RAD READ Result Date: 12/12/2023 CLINICAL DATA:  This over-read does not include interpretation of cardiac or coronary anatomy or pathology. The cardiac SPECT CT interpretation by the cardiologist is  attached. COMPARISON:  August 17, 2006. FINDINGS: Vascular: The visualized portions of the extracardiac vascular structures of the chest are unremarkable on these unenhanced images. Mediastinum/Nodes: Visualized mediastinum is unremarkable. Lungs/Pleura: Visualized pulmonary parenchyma is unremarkable. Upper Abdomen: Visualized portion of upper abdomen is unremarkable. Musculoskeletal: Visualized skeleton is unremarkable. IMPRESSION: No definite abnormality seen involving the visualized portions of the extracardiac structures of the chest. Electronically Signed   By: Lynwood Landy Raddle M.D.   On: 12/12/2023 08:54   MYOCARDIAL PERFUSION IMAGING Result Date: 12/10/2023   Findings are consistent with small apical inferior infarction. The study is low risk.   No ST deviation was noted.   LV perfusion is abnormal. There is no evidence of ischemia. There is evidence of infarction. Defect 1: There is a small defect with moderate reduction in uptake present in the apical inferior location(s) that is fixed. There is normal wall motion in the defect area. Consistent with infarction.   Left ventricular function is normal. Nuclear stress EF: 73%. The left ventricular ejection fraction is hyperdynamic (>65%). End diastolic cavity size is normal. End systolic cavity size is normal.   CT images were obtained for attenuation correction and were examined for the presence of coronary calcium when appropriate.   Coronary calcium was present on the attenuation correction CT images. Moderate coronary calcifications were present. Coronary calcifications were present in the left anterior descending artery distribution(s).    Disposition Pt is being discharged home today in good condition.  Follow-up Plans & Appointments    Discharge Medications Allergies as of 12/22/2023       Reactions   Valtrex  [valacyclovir ] Nausea And Vomiting   Celebrex [celecoxib] Hives   Darvon [propoxyphene]    hallucinations   Duract [bromfenac]  Itching   Nitrofuran Derivatives    Headache    Oxycodone-acetaminophen  Itching   Prednisone    Manic, SI and weepy when decreasing doses.   Sulfa Antibiotics Hives        Medication List     STOP taking these medications    metoprolol  tartrate 25 MG tablet Commonly known as: LOPRESSOR        TAKE these medications    ALIGN PREBIOTIC-PROBIOTIC PO Take 1 capsule by mouth at bedtime.   amLODipine  5 MG tablet Commonly known as: NORVASC  Take 1 tablet (5 mg total) by mouth daily. Start taking on: December 23, 2023   aspirin  EC 81 MG tablet Take 1 tablet (81 mg total) by mouth daily. Swallow whole.   Calcium Carbonate-Vitamin D 600-400 MG-UNIT chew tablet Chew 1 tablet by mouth daily.   CENTRUM SILVER ADULT 50+ PO Take 1 tablet by mouth daily.   DHEA PO Take 5 mg by mouth daily.   ezetimibe  10 MG tablet Commonly known as: ZETIA  Take 1 tablet (10 mg total) by mouth daily.   famciclovir 500 MG tablet Commonly known as: FAMVIR Take 500 mg by mouth daily.   famotidine   20 MG tablet Commonly known as: PEPCID  Take 20 mg by mouth at bedtime.   Fish Oil 1000 MG Caps Take 1,000 mg by mouth every other day.   gabapentin  600 MG tablet Commonly known as: NEURONTIN  Take 1 tablet (600 mg total) by mouth 3 (three) times daily.   Melatonin 10 MG Chew Chew 10 mg by mouth at bedtime.   nitroGLYCERIN  0.4 MG SL tablet Commonly known as: Nitrostat  Place 1 tablet (0.4 mg total) under the tongue every 5 (five) minutes as needed for chest pain.   pantoprazole  40 MG tablet Commonly known as: PROTONIX  TAKE ONE TABLET BY MOUTH ONE TIME DAILY   polyethylene glycol powder 17 GM/SCOOP powder Commonly known as: GLYCOLAX/MIRALAX Take 17 g by mouth daily.   pyridOXINE 25 MG tablet Commonly known as: VITAMIN B6 Take 25 mg by mouth daily.   vitamin E 180 MG (400 UNITS) capsule Take 400 Units by mouth daily.   Well Vitamin D3 50 MCG (2000 UT) Caps Generic drug:  Cholecalciferol Take 2,000 Units by mouth daily.   zolpidem  5 MG tablet Commonly known as: AMBIEN  Take 1 tablet (5 mg total) by mouth at bedtime as needed. for sleep What changed:  how much to take reasons to take this         Outstanding Labs/Studies Check lipid panel outpatient.  Duration of Discharge Encounter: APP Time: 20 minutes   Signed, Thom LITTIE Sluder, PA-C 12/22/2023, 2:18 PM  I have seen and examined the patient along with Thom LITTIE Sluder, PA-C.  I have reviewed the chart, notes and new data.  I agree with PA/NP's note.  Key new complaints: Did not have any problems with the CT.  Has not had any recurrence of chest discomfort. Key examination changes: Normal cardiovascular examination.  Blood pressure has been as low as 103/60 and she has not had any systolic BP higher than 125. Key new findings / data: Has not had any meaningful arrhythmia on telemetry during this hospitalization.  PLAN: She does not have any meaningful coronary obstructive lesions, although the coronary calcium score is higher than average for age and gender (interestingly total plaque volume is just average).  Aortic atherosclerosis is also noted. Pointed out the importance of maintaining LDL cholesterol less than 70.  A year ago the LDL cholesterol was 155, similar to the year before that.  She is only taking ezetimibe  10 mg once daily.  Has a repeat lipid profile scheduled.  Would encourage her to take a statin such as rosuvastatin 20 mg once daily in addition to the ezetimibe . She is reluctant to take antihypertensive medications.  She notes low diastolic blood pressure in the past.  She does not think her elevated blood pressure was the cause for her admission symptoms. She is troubled more by the episodes of unexplained changes in heart rate and associated palpitations and is wondering whether she has arrhythmia.  Strongly encouraged her to get some type of self monitoring device such as a smart watch  or Kardia device. Asked her to keep a log of her blood pressure and bring it with her to the follow-up appointment in a few days.  She has the amlodipine  5 mg once daily prescription available if necessary, if her blood pressure is over 130/80.   Total physician discharge time 35 minutes  Jerel Balding, MD, FACC [facility] [phone] 12/22/2023, 3:05 PM

## 2023-12-22 NOTE — Progress Notes (Signed)
  Progress Note  Patient Name: Amanda Waters Date of Encounter: 12/22/2023 [facility] Cardiologist: Candyce Reek, MD   Interval Summary   Did not sleep too well, but otherwise no complaints.  Echocardiogram was normal.  Vital Signs Vitals:   12/21/23 2000 12/21/23 2300 12/22/23 0531 12/22/23 0804  BP: 110/67 101/70 120/69 126/67  Pulse: 74 78 70 75  Resp: 18 19 18 20   Temp: 98.2 F (36.8 C) 98.3 F (36.8 C) 98.2 F (36.8 C) 97.9 F (36.6 C)  TempSrc: Oral Oral Oral Oral  SpO2: 97% 95% 98% 97%  Weight:      Height:        Intake/Output Summary (Last 24 hours) at 12/22/2023 0858 Last data filed at 12/22/2023 0532 Gross per 24 hour  Intake 0 ml  Output 2 ml  Net -2 ml      12/20/2023    4:00 PM 12/19/2023    8:14 PM 12/03/2023   10:56 AM  Last 3 Weights  Weight (lbs) 143 lb 12.8 oz 145 lb 8.1 oz 144 lb 12.8 oz  Weight (kg) 65.227 kg 66 kg 65.681 kg      Telemetry/ECG  Normal sinus rhythm- Personally Reviewed  Physical Exam  GEN: No acute distress.   Neck: No JVD Cardiac: RRR, no murmurs, rubs, or gallops.  Respiratory: Clear to auscultation bilaterally. GI: Soft, nontender, non-distended  MS: No edema  Assessment & Plan  For coronary CT angiogram this morning.  If there are no serious coronary stenoses, will likely discharge home today. Initial evaluation was actually triggered by palpitations and a report of abnormal heart rate from her smart watch.  We have not seen any arrhythmia while in the hospital.  Encouraged her to submit the ECG tracings from her Apple Watch via MyChart, when/if they recur.      For questions or updates, please contact [facility] Please consult [email or web address] for contact info under       Signed, Jerel Balding, MD

## 2023-12-22 NOTE — TOC CM/SW Note (Signed)
 Transition of Care [facility]) - Inpatient Brief Assessment   Patient Details  Name: Amanda Waters MRN: [id number] Date of Birth: 21-Sep-1955  Transition of Care [facility]) CM/SW Contact:    Roxie KANDICE Stain, RN Phone Number: 12/22/2023, 12:30 PM   Clinical Narrative:  CT angiogram scheduled for today.  No TOC needs at this time.  Transition of Care Asessment: Insurance and Status: Insurance coverage has been reviewed Patient has primary care physician: Yes Home environment has been reviewed: safe to discharge home Prior level of function:: independent Prior/Current Home Services: No current home services Social Drivers of Health Review: SDOH reviewed no interventions necessary Readmission risk has been reviewed: Yes Transition of care needs: no transition of care needs at this time

## 2023-12-25 NOTE — Addendum Note (Signed)
 Addended by: RANDY HAMP SAILOR on: 12/25/2023 05:16 PM   Modules accepted: Orders

## 2024-01-01 NOTE — Progress Notes (Signed)
 Cardiology Office Note:    Date:  01/12/2024   ID:  Sharlot, Sturkey [birth year], MRN [id number]  PCP:  Pura Lenis, MD  Cardiologist:  Stanly DELENA Leavens, MD Cardiology APP:  Xayne Brumbaugh E, PA-C     Referring MD: Pura Lenis, MD   Chief Complaint: hospital follow-up of chest pain  History of Present Illness:    Amanda Waters is a 68 y.o. female with a history of mild non-obstructive CAD on coronary CTA on 12/22/2023, exercise-induced tachycardia, hyperlipidemia, central sleep apnea, GERD, IBS, fibromyalgia, iron deficiency anemia, and chronic back pain who is followed by Dr. Leavens and presents today for hospital follow-up of chest pain.  Patient was initially referred to Dr. Dann in 06/2019 at which time she reported tachycardia, particularly with activity (even light activity) since having COVID in 03/2019. She also reported one episode of syncope when she had COVID that occurred after she woke up a 3am and went to the bathroom. Syncopal episode was preceded by a feeling like the room was spinning. No recurrence syncope. Echo and 2 week Zio monitor were ordered for further evaluation. Echo showed LVEF of 60-65% with no significant valvular disease. Monitor showed normal sinus rhythm with PACs/ PVCs.   She was not seen by Cardiology again until recently when she was referred to Dr. Leavens on 12/03/2023 for further evaluation of chest pain and continued palpitations/ tachycardia with minimal exertion.  Exercise Myoview  and Zio monitor were ordered for further evaluation. Myoview  was low risk with findings consistent with a small apical inferior infarction but no ischemia. She was started on a trial of low dose Metoprolol .  Before monitor could be completed, she was admitted from 12/19/2023 to 12/22/2023 for chest pain that radiated to her left jaw and arm that was triggered by palpitations and a report of an abnormal heart rate on her smart watch. EKG showed no  acute ischemic changes High-sensitivity troponin was negative. Echo showed LVEF of 60-65% with normal wall motion and grade 1 diastolic dysfunction, normal RV function, and no significant valvular disease. Coronary CTA showed a coronary calcium score of 132 (84th percentile for age and sex) with minimal stenosis (0-24%) in the proximal LAD and mild stenosis (25-49%) in the mid LAD, proximal LCX, and proximal to mid RCA. Lopressor  was stopped due to fatigue and she was started on Amlodipine  instead.   Monitor showed sinus rhythm with average heart rate of 77 bpm (min 43, max 159) with one 11 beat run of SVT, occasional PACs (2.5% burden), and rare PVCs (<1.0% burden). Triggered events correlated with sinus rhythm and PACs.  Patient presents today for follow-up. Here with her husband.  She has not any recurrent severe chest pain with radiation to her arm or jaw all like what brought her into the ED recently.  She does continue to have some mild chest fullness though.  She states she notices this at night but also with exertion when her heart rate increases.  She continues to have fluctuating heart rates with rates as high as the 150s to 170s when she is in her dance classes.  She notes feeling a little dizzy, nauseous, and short of breath when this happens.  No shortness of breath at rest.  No orthopnea, PND, edema.  No syncope.  She also reports a lot of fatigue. She does have a history of sleep apnea. Her husband reports loud snoring and apneic episodes. She is not currently on CPAP but is scheduled to  have a split night sleep study in 02/2024.  I wonder whether deconditioning is playing a role in her palpitations/ tachycardia.  She had an episode of tachycardia with heart rates in the 190s while at a dance class in 11/2022 after being inactive for a while due to an illness illness.  She then stopped going to these dance classes for several months.  She briefly started going back to a less intense dance class  in 05/2023 but then took a break until a couple months ago.  She has not taken the Amlodipine  yet because her BP has been well controlled and she states she was told that she only needs to take this if her BP was elevated.   EKGs/Labs/Other Studies Reviewed:    The following studies were reviewed:  Monitor 07/2019: Normal sinus rhythm with rare PACs and PVCs. No pathologic arrhtyhmias. _______________  Myoview  12/10/2023:   Findings are consistent with small apical inferior infarction. The study is low risk.   No ST deviation was noted.   LV perfusion is abnormal. There is no evidence of ischemia. There is evidence of infarction. Defect 1: There is a small defect with moderate reduction in uptake present in the apical inferior location(s) that is fixed. There is normal wall motion in the defect area. Consistent with infarction.   Left ventricular function is normal. Nuclear stress EF: 73%. The left ventricular ejection fraction is hyperdynamic (>65%). End diastolic cavity size is normal. End systolic cavity size is normal.   CT images were obtained for attenuation correction and were examined for the presence of coronary calcium when appropriate.   Coronary calcium was present on the attenuation correction CT images. Moderate coronary calcifications were present. Coronary calcifications were present in the left anterior descending artery distribution(s). _______________  Monitor 12/13/2023 to 12/19/2023 (Preliminary Read):  Patient had a minimum heart rate of 43 bpm, maximum heart rate of 159 bpm, and average heart rate of 77 bpm. Predominant underlying rhythm was sinus rhythm. One run of SVT lasting 11 beats.  Isolated PACs were occasional (2.5%).  Isolated PVCs were rare (<1.0%).  Triggered and diary events associated with sinus rhythm and PACs.   Occasional, symptomatic PACs.  _______________  Echocardiogram 12/21/2023: Impressions: 1. Left ventricular ejection fraction, by estimation,  is 60 to 65%. The  left ventricle has normal function. The left ventricle has no regional  wall motion abnormalities. Left ventricular diastolic parameters are  consistent with Grade I diastolic  dysfunction (impaired relaxation).   2. Right ventricular systolic function is normal. The right ventricular  size is normal.   3. The mitral valve is normal in structure. No evidence of mitral valve  regurgitation. No evidence of mitral stenosis.   4. The aortic valve is normal in structure. Aortic valve regurgitation is  not visualized. No aortic stenosis is present.   5. The inferior vena cava is normal in size with greater than 50%  respiratory variability, suggesting right atrial pressure of 3 mmHg.  _______________  Coronary CTA 12/22/2023: Impressions: 1. Coronary calcium score of 132. This was 84th percentile for age-, sex, and race-matched controls. 2. Total plaque volume 131mm3 which is 55th percentile for age- and sex-matched controls (calcified plaque 31mm3; non-calcified plaque 157mm3). TPV is moderate 3.  Normal coronary origin with right dominance. 4. Mild (25-49%) stenosis in mid LAD, proximal LCX, and proximal/mid RCA 5.  Minimal (0-24%) stenosis in proximal LAD   Recommendations: CAD-RADS 2: Mild non-obstructive CAD (25-49%). Consider non-atherosclerotic causes of chest pain.  Consider preventive therapy and risk factor modification.   EKG:  EKG not ordered today.   Recent Labs: 12/20/2023: BUN 13; Creatinine, Ser 0.98; Hemoglobin 11.9; Platelets 316; Potassium 4.2; Sodium 137  Recent Lipid Panel No results found for: CHOL, TRIG, HDL, CHOLHDL, VLDL, LDLCALC, LDLDIRECT  Physical Exam:    Vital Signs: BP 126/84   Pulse 81   Ht 5' 5 (1.651 m)   Wt 145 lb 12.8 oz (66.1 kg)   SpO2 99%   BMI 24.26 kg/m     Wt Readings from Last 3 Encounters:  01/12/24 145 lb 12.8 oz (66.1 kg)  12/20/23 143 lb 12.8 oz (65.2 kg)  12/03/23 144 lb 12.8 oz (65.7 kg)      General: 68 y.o. Caucasian female in no acute distress. HEENT: Normocephalic and atraumatic. Sclera clear.  Neck: Supple. No JVD. Heart: RRR. Distinct S1 and S2. No murmurs, gallops, or rubs.  Lungs: No increased work of breathing. Clear to ausculation bilaterally. No wheezes, rhonchi, or rales.  Abdomen: Soft, non-distended, and non-tender to palpation.  Extremities: No lower extremity edema.   Skin: Warm and dry. Neuro: No focal deficits. Psych: Normal affect. Responds appropriately.   Assessment:    1. Coronary artery disease involving native coronary artery of native heart without angina pectoris   2. Exercise-induced tachycardia   3. Hyperlipidemia, unspecified hyperlipidemia type   4. Sleep apnea, unspecified type     Plan:    Mild Non-Obstructive CAD Myoview  in 11/2023 was low risk and showed findings consistent with a small apical inferior infarction but no ischemia. However, coronary CTA earlier this month on 12/22/2023 showed a coronary calcium score of 132 (84th percentile for age and sex) with minimal stenosis (0-24%) in the proximal LAD and mild stenosis (25-49%) in the mid LAD, proximal LCX, and proximal to mid RCA.  - She reports some chest fullness both at night and with exertion when her heart rate increases but no other chest pain/ discomfort. - Will stop Amlodipine  5mg  daily (she has not actually taken this) and switch to Diltiazem  as below. - Continue Aspirin  81mg  daily.  - Patient has declined statin in the past given her history of fibromyalgia. Continue Zetia  10mg  daily.   Exercise Induced Tachycardia Patient has a history of palpitations/ tachycardia with minimal exertion since having COVID in 2020. Monitor in 2021 showed PACs/ PVCs but no significant arrhythmias. Repeat monitor in 11/2023 showed one 11 beat run of SVT as well as occasional PACs (2.5% burden) and rare PVCs (<1.0% burden). Triggered events were associated with sinus rhythm and PACs. - She  continues to have fluctuating heart rates with rates as high as the 150s to 170s with exertion. She is mildly symptomatic with this. - She was recently started on low dose Lopressor  but had fatigue with this so it was stopped. Will try extended release Diltiazem  60mg  twice daily instead. - I wonder whether deconditioning is playing a role in her tachycardia as she noticed this after she recently resumed dance classes. Encouraged patient to increase physical activity as tolerated.  If she is unable to tolerate the Diltiazem , we will stop this and slowly focus on her increasing her activity level.  Unfortunately, it do not think she would meet criteria for cardiac rehab given no obstructive CAD or CHF.  Hyperlipidemia Lipid panel in 12/2022: Total Cholesterol 236, Triglycerides 164, HDL 51, LDL 155. LDL goal <70. - Patient has declined a statin in the past given her history of fibromyalgia. Continue Zetia   10mg  daily which was started in 11/2023. - Plan is for repeat lipid panel in 02/2024. If LDL significantly elevated, would recommend a statin. Will also check a lipoprotein (a) given history given family history of stroke and MI at a young age. If lipoprotein (a) is significantly elevated, may benefit from referral to lipid clinic for consideration of PCSK9 inhibitor as well.   Sleep Apnea Patient reports being diagnosed with sleep apnea several years ago but is not on CPAP.  - She is already scheduled to have a Split Night sleep study in 02/2024.  Disposition: Follow up in 3 months.   Signed, Aline FORBES Door, PA-C  01/12/2024 5:11 PM    [facility]

## 2024-01-04 DIAGNOSIS — R002 Palpitations: Secondary | ICD-10-CM

## 2024-01-12 ENCOUNTER — Ambulatory Visit: Attending: Student | Admitting: Student

## 2024-01-12 ENCOUNTER — Ambulatory Visit: Admitting: Student

## 2024-01-12 ENCOUNTER — Encounter: Payer: Self-pay | Admitting: Student

## 2024-01-12 VITALS — BP 126/84 | HR 81 | Ht 65.0 in | Wt 145.8 lb

## 2024-01-12 DIAGNOSIS — R000 Tachycardia, unspecified: Secondary | ICD-10-CM | POA: Insufficient documentation

## 2024-01-12 DIAGNOSIS — I2510 Atherosclerotic heart disease of native coronary artery without angina pectoris: Secondary | ICD-10-CM | POA: Insufficient documentation

## 2024-01-12 DIAGNOSIS — G4730 Sleep apnea, unspecified: Secondary | ICD-10-CM | POA: Diagnosis not present

## 2024-01-12 DIAGNOSIS — E785 Hyperlipidemia, unspecified: Secondary | ICD-10-CM | POA: Diagnosis not present

## 2024-01-12 MED ORDER — DILTIAZEM HCL ER 60 MG PO CP12: 60.0000 mg | ORAL_CAPSULE | Freq: Two times a day (BID) | ORAL | 2 refills | Status: DC

## 2024-01-12 NOTE — Patient Instructions (Addendum)
 Medication Instructions:  Stop Amlodipine  5mg .  Start Diltiazem  60mg . Take this tablet 2 times daily.   *If you need a refill on your cardiac medications before your next appointment, please call your pharmacy*   Lab Work: Labs will be drawn today.................S[facility]   If you have labs (blood work) drawn today and your tests are completely normal, you will receive your results only by: MyChart Message (if you have MyChart) OR A paper copy in the mail If you have any lab test that is abnormal or we need to change your treatment, we will call you to review the results.   Testing/Procedures: No procedures were ordered during today's visit.    Follow-Up: At [facility], you and your health needs are our priority.  As part of our continuing mission to provide you with exceptional heart care, we have created designated Provider Care Teams.  These Care Teams include your primary Cardiologist (physician) and Advanced Practice Providers (APPs -  Physician Assistants and Nurse Practitioners) who all work together to provide you with the care you need, when you need it.  We recommend signing up for the patient portal called MyChart.  Sign up information is provided on this After Visit Summary.  MyChart is used to connect with patients for Virtual Visits (Telemedicine).  Patients are able to view lab/test results, encounter notes, upcoming appointments, etc.  Non-urgent messages can be sent to your provider as well.   To learn more about what you can do with MyChart, go to [email or web address].    Your next appointment:   3 month(s)  Provider:   Callie Goodrich, PA-C          Other Instructions Thank you for choosing [facility]!

## 2024-01-13 ENCOUNTER — Ambulatory Visit: Payer: Self-pay | Admitting: Student

## 2024-01-13 LAB — LIPOPROTEIN A (LPA): Lipoprotein (a): 8.9 nmol/L (ref <75.0)

## 2024-02-24 ENCOUNTER — Ambulatory Visit (HOSPITAL_BASED_OUTPATIENT_CLINIC_OR_DEPARTMENT_OTHER): Attending: Internal Medicine | Admitting: Cardiology

## 2024-02-24 VITALS — Ht 65.0 in | Wt 143.0 lb

## 2024-02-24 DIAGNOSIS — G4736 Sleep related hypoventilation in conditions classified elsewhere: Secondary | ICD-10-CM | POA: Diagnosis not present

## 2024-02-24 DIAGNOSIS — G4733 Obstructive sleep apnea (adult) (pediatric): Secondary | ICD-10-CM | POA: Diagnosis not present

## 2024-02-24 DIAGNOSIS — Z8669 Personal history of other diseases of the nervous system and sense organs: Secondary | ICD-10-CM | POA: Diagnosis present

## 2024-03-06 ENCOUNTER — Encounter: Payer: Self-pay | Admitting: Internal Medicine

## 2024-03-08 ENCOUNTER — Telehealth: Payer: Self-pay | Admitting: Internal Medicine

## 2024-03-08 NOTE — Telephone Encounter (Signed)
 Patient called to follow-up on her sleep study  results and next steps.

## 2024-03-10 ENCOUNTER — Ambulatory Visit: Admitting: Physician Assistant

## 2024-03-15 NOTE — Procedures (Signed)
 Indications for Polysomnography The patient is a 68 year old Female who is 5' 5 and weighs 143.0 lbs. Her BMI equals 23.8.  A full night polysomnogram was performed to evaluate for Obstructive Sleep Apnea.  Patient reported taking all of her medications at 10:00 pm. except she took her melatonin at 12:09 am.AmbienDHEAPepcidDiltiazemGabapentinMelatonin Polysomnogram Data A full night polysomnogram recorded the standard physiologic parameters including EEG, EOG, EMG, EKG, nasal and oral airflow.  Respiratory parameters of chest and abdominal movements were recorded with Respiratory Inductance Plethysmography belts.   Oxygen saturation was recorded by pulse oximetry.  Sleep Architecture The total recording time of the polysomnogram was 368.3 minutes.  The total sleep time was 159.0 minutes.  The patient spent 17.6% of total sleep time in Stage N1, 73.0% in Stage N2, 3.5% in Stages N3, and 6.0% in REM.  Sleep latency was 74.5 minutes.   REM latency was 119.5 minutes.  Sleep Efficiency was 43.2%.  Wake after Sleep Onset time was 134.5 minutes.  Respiratory Events The polysomnogram revealed a presence of 1 obstructive, 2 central, and 0 mixed apneas resulting in an Apnea index of 1.1 events per hour.  There were 84 hypopneas (GreaterEqual to3% desaturation and/or arousal) resulting in an Apnea\Hypopnea Index (AHI  GreaterEqual to3% desaturation and/or arousal) of 32.8 events per hour.  There were 45 hypopneas (GreaterEqual to4% desaturation) resulting in an Apnea\Hypopnea Index (AHI GreaterEqual to4% desaturation) of 18.1 events per hour.  There were 50  Respiratory Effort Related Arousals resulting in a RERA index of 18.9 events per hour. The Respiratory Disturbance Index is 51.7 events per hour.  The snore index was 0 events per hour.  Mean oxygen saturation was 93.0%.  The lowest oxygen saturation during sleep was 81.0%.  Time spent LessEqual to88% oxygen saturation was  minutes ().  Limb  Activity There were 0 total limb movements recorded.  Cardiac Summary The average pulse rate was 61.8 bpm.  The minimum pulse rate was 55.0 bpm while the maximum pulse rate was 86.0 bpm.  Cardiac rhythm was NSR with PACs.   Diagnosis: Moderate Obstructive Sleep Apnea Nocturnal Hypoxemia    Recommendations: 1.  Clinical correlation of these findings is necessary.  The decision to treat obstructive sleep apnea (OSA) is usually based on the presence of apnea symptoms or the presence of associated medical conditions such as Hypertension, Congestive Heart  Failure, Atrial Fibrillation or Obesity.  The most common symptoms of OSA are snoring, gasping for breath while sleeping, daytime sleepiness and fatigue.  2.  Initiating apnea therapy is recommended given the presence of symptoms and/or associated conditions. Recommend proceeding with one of the following:  a.  Auto-CPAP therapy with a pressure range of 5-20cm H2O.  b.  An oral appliance (OA) that can be obtained from certain dentists with expertise in sleep medicine.  These are primarily of use in non-obese patients with mild and moderate disease.  c.  An ENT consultation which may be useful to look for specific causes of obstruction and possible treatment options.  d.  If patient is intolerant to PAP therapy, consider referral to ENT for evaluation for hypoglossal nerve stimulator.  3.  Close follow-up is necessary to ensure success with CPAP or oral appliance therapy for maximum benefit.  4.  A follow-up oximetry study on CPAP is recommended to assess the adequacy of therapy and determine the need for supplemental oxygen or the potential need for Bi-level therapy.  An arterial blood gas to determine the adequacy of baseline ventilation  and oxygenation should also be considered.  5.  Healthy sleep recommendations include:  adequate nightly sleep (normal 7-9 hrs/night), avoidance of caffeine after noon and alcohol near bedtime, and  maintaining a sleep environment that is cool, dark and quiet.  6.  Weight loss for overweight patients is recommended.  Even modest amounts of weight loss can significantly improve the severity of sleep apnea.  7.  Snoring recommendations include:  weight loss where appropriate, side sleeping, and avoidance of alcohol before bed.  8.  Operation of motor vehicle should be avoided when sleepy.    This study was personally reviewed and electronically signed by: Dr. Wilbert Bihari Accredited Board Certified in Sleep Medicine Date/Time: 03/15/2024 8:52PM

## 2024-03-16 ENCOUNTER — Encounter: Payer: Self-pay | Admitting: Internal Medicine

## 2024-03-16 ENCOUNTER — Ambulatory Visit: Admitting: Student

## 2024-03-18 ENCOUNTER — Telehealth: Payer: Self-pay | Admitting: *Deleted

## 2024-03-18 DIAGNOSIS — G4730 Sleep apnea, unspecified: Secondary | ICD-10-CM

## 2024-03-18 DIAGNOSIS — I2510 Atherosclerotic heart disease of native coronary artery without angina pectoris: Secondary | ICD-10-CM

## 2024-03-18 NOTE — Telephone Encounter (Signed)
 The patient has been notified of the result and verbalized understanding.  All questions (if any) were answered. Joshua Dalton Seip, CMA 03/18/2024 11:12 AM    Patient read her results in her mychart and she thinks she is suppose to get a APAP machine because she read the recommendations. She states Would like to get a Res-med Auto-CPAP machine ordered ASAP. Because says her oxygen keeps running low and she is anxious to get started.

## 2024-03-18 NOTE — Telephone Encounter (Signed)
 The patient has been notified of the result and verbalized understanding.  All questions (if any) were answered. Joshua Dalton Seip, CMA 03/18/2024 12:42 PM

## 2024-03-18 NOTE — Telephone Encounter (Signed)
-----   Message from Wilbert Bihari sent at 03/15/2024  8:53 PM EDT ----- Please let patient know that they have sleep apnea.  Recommend therapeutic CPAP titration for treatment of patient's sleep disordered breathing.

## 2024-03-18 NOTE — Telephone Encounter (Signed)
 Per Dr Shlomo, Because of degree of OSA she needs ASAP CPAP titration.

## 2024-03-19 ENCOUNTER — Encounter (INDEPENDENT_AMBULATORY_CARE_PROVIDER_SITE_OTHER): Payer: Self-pay | Admitting: Internal Medicine

## 2024-03-19 DIAGNOSIS — D509 Iron deficiency anemia, unspecified: Secondary | ICD-10-CM

## 2024-03-19 DIAGNOSIS — E785 Hyperlipidemia, unspecified: Secondary | ICD-10-CM

## 2024-03-21 NOTE — Telephone Encounter (Signed)
 Chart reviewed: - Patient has upcoming appt 04/13/24;  - see 12/03/23 (distant [facility] patient) has hx of IDA - normal Echo, normal heart monitoring, non-obstructive CAD - no cardiac indication for her exercise induced tachycardia is noted - LDL goal < 70 at least, she has requested anemia testing at upcoming lab draw. - I think this is quite reasonable, CBC, and Iron studies (TIBC, Ferriten, Transferrin- can order the iron studies panel); we will give those results to Dr. Pura and attempt to minimize her IV sticks  Please see the MyChart message reply(ies) for my assessment and plan.    This patient gave consent for this Medical Advice Message and is aware that it may result in a bill to [employer], as well as the possibility of receiving a bill for a co-payment or deductible. They are an established patient, but are not seeking medical advice exclusively about a problem treated during an in person or video visit in the last seven days. I did not recommend an in person or video visit within seven days of my reply.    I spent a total of 7 minutes cumulative time within 7 days through [employer].  Amanda DELENA Leavens, MD

## 2024-03-23 NOTE — Addendum Note (Signed)
 Addended by: Karne Ozga D on: 03/23/2024 04:08 PM   Modules accepted: Orders

## 2024-03-24 ENCOUNTER — Ambulatory Visit (HOSPITAL_BASED_OUTPATIENT_CLINIC_OR_DEPARTMENT_OTHER): Attending: Cardiology | Admitting: Cardiology

## 2024-03-24 VITALS — Ht 65.0 in | Wt 150.0 lb

## 2024-03-24 DIAGNOSIS — G4730 Sleep apnea, unspecified: Secondary | ICD-10-CM | POA: Diagnosis not present

## 2024-03-24 DIAGNOSIS — I2510 Atherosclerotic heart disease of native coronary artery without angina pectoris: Secondary | ICD-10-CM

## 2024-03-24 DIAGNOSIS — G4733 Obstructive sleep apnea (adult) (pediatric): Secondary | ICD-10-CM | POA: Diagnosis present

## 2024-03-25 LAB — CBC

## 2024-03-25 NOTE — Procedures (Signed)
  Indications for Polysomnography The patient is a 68 year old Female who is 5' 5 and weighs 150.0 lbs. Her BMI equals 25.0.  A full night titration treatment study was performed.  Medications taken at 2130.AMBIENDILTIAZEMPEPCIDGABAPENTIN Polysomnogram Data A full night polysomnogram recorded the standard physiologic parameters including EEG, EOG, EMG, EKG, nasal and oral airflow.  Respiratory parameters of chest and abdominal movements were recorded with Respiratory Inductance Plethysmography belts.   Oxygen saturation was recorded by pulse oximetry.  Sleep Architecture The total recording time of the polysomnogram was 398.3 minutes.  The total sleep time was 276.5 minutes.  The patient spent 0.7% of total sleep time in Stage N1, 16.8% in Stage N2, 75.2% in Stages N3, and 7.2% in REM.  Sleep latency was 49.0 minutes.   REM latency was 82.0 minutes.  Sleep Efficiency was 69.4%.  Wake after Sleep Onset time was 72.5 minutes.  Titration Summary The patient was titrated at pressures ranging from 8 cm/H20 up to 8 cm/H20. The last pressure used in the study was 8 cm/H20.  Respiratory Events The polysomnogram revealed a presence of 2 obstructive, 10 central, and 1 mixed apnea resulting in an Apnea index of 2.8 events per hour.  There were 8 hypopneas (GreaterEqual to3% desaturation and/or arousal) resulting in an Apnea\Hypopnea Index (AHI  GreaterEqual to3% desaturation and/or arousal) of 4.6 events per hour.  There were 2 hypopneas (GreaterEqual to4% desaturation) resulting in an Apnea\Hypopnea Index (AHI GreaterEqual to4% desaturation) of 3.3 events per hour.  There were 33 Respiratory  Effort Related Arousals resulting in a RERA index of 7.2 events per hour. The Respiratory Disturbance Index is 11.7 events per hour.  The snore index was 1.5 events per hour.  Mean oxygen saturation was 95.6%.  The lowest oxygen saturation during sleep was 90.0%.  Time spent LessEqual to88% oxygen saturation was 0  minutes.  Limb Activity There were 0 limb movements recorded.  Cardiac Summary The average pulse rate was 64.8 bpm.  The minimum pulse rate was 57.0 bpm while the maximum pulse rate was 85.0 bpm.  Cardiac rhythm was NSR.  Diagnosis: Obstructive Sleep Apnea Successful CPAP titration     Recommendations: 1. Recommend a trial of ResMed CPAP at 8cm H2O with EPR 2cm H2O, heated humidity and large Respironics Wisp Nasal mask with chin strap . 2. Close follow-up is necessary to ensure success with CPAP or oral appliance therapy for maximum benefit. 3. A follow-up oximetry study on CPAP is recommended to assess the adequacy of therapy and determine the need for supplemental oxygen or the potential need for Bi-level therapy.  An arterial blood gas to determine the adequacy of baseline ventilation and  oxygenation should also be considered. 4. Healthy sleep recommendations include:  adequate nightly sleep (normal 7-9 hrs/night), avoidance of caffeine after noon and alcohol near bedtime, and maintaining a sleep environment that is cool, dark and quiet. 5. Weight loss for overweight patients is recommended.  Even modest amounts of weight loss can significantly improve the severity of sleep apnea. 6. Snoring recommendations include:  weight loss where appropriate, side sleeping, and avoidance of alcohol before bed. 7. Operation of motor vehicle should be avoided when sleepy.    This study was personally reviewed and electronically signed by: SHLOMO WILBERT SAUNDERS., MD Accredited Board Certified in Sleep Medicine Date/Time: 03/25/2024 3:48PM

## 2024-03-26 ENCOUNTER — Telehealth: Payer: Self-pay | Admitting: *Deleted

## 2024-03-26 LAB — CBC
Hematocrit: 39.2 % (ref 34.0–46.6)
Hemoglobin: 12.7 g/dL (ref 11.1–15.9)
MCH: 27.3 pg (ref 26.6–33.0)
MCHC: 32.4 g/dL (ref 31.5–35.7)
MCV: 84 fL (ref 79–97)
Platelets: 328 x10E3/uL (ref 150–450)
RBC: 4.65 x10E6/uL (ref 3.77–5.28)
RDW: 13 % (ref 11.7–15.4)
WBC: 7.3 x10E3/uL (ref 3.4–10.8)

## 2024-03-26 LAB — HEPATIC FUNCTION PANEL
ALT: 24 IU/L (ref 0–32)
AST: 24 IU/L (ref 0–40)
Albumin: 4.8 g/dL (ref 3.9–4.9)
Alkaline Phosphatase: 50 IU/L (ref 49–135)
Bilirubin Total: 0.9 mg/dL (ref 0.0–1.2)
Bilirubin, Direct: 0.22 mg/dL (ref 0.00–0.40)
Total Protein: 7 g/dL (ref 6.0–8.5)

## 2024-03-26 LAB — LIPID PANEL
Chol/HDL Ratio: 4.4 ratio (ref 0.0–4.4)
Cholesterol, Total: 203 mg/dL — ABNORMAL HIGH (ref 100–199)
HDL: 46 mg/dL (ref >39)
LDL Chol Calc (NIH): 127 mg/dL — ABNORMAL HIGH (ref 0–99)
Triglycerides: 168 mg/dL — ABNORMAL HIGH (ref 0–149)
VLDL Cholesterol Cal: 30 mg/dL (ref 5–40)

## 2024-03-26 NOTE — Telephone Encounter (Signed)
 The patient has been notified of the result and verbalized understanding.  All questions (if any) were answered. Amanda Waters, CMA 03/26/2024 10:44 AM    Upon patient request DME selection is [facility] Patient understands he will be contacted by [facility] to set up his cpap. Patient understands to call if [facility] does not contact him with new setup in a timely manner. Patient understands they will be called once confirmation has been received from [facility] that they have received their new machine to schedule 10 week follow up appointment.   [facility] notified of new cpap order  Please add to airview Patient was grateful for the call and thanked me.

## 2024-03-26 NOTE — Telephone Encounter (Signed)
-----   Message from Wilbert Bihari sent at 03/25/2024  3:50 PM EST ----- Please let patient know that they had a successful PAP titration and let DME know that orders are in EPIC.  Please set up 6 week OV with me.

## 2024-03-29 NOTE — Telephone Encounter (Signed)
 The patient has been notified of the result and verbalized understanding.  All questions (if any) were answered. Joshua Dalton Seip, CMA 03/29/2024 3:35 PM .

## 2024-04-13 ENCOUNTER — Ambulatory Visit: Attending: Internal Medicine | Admitting: Internal Medicine

## 2024-04-13 VITALS — BP 116/60 | HR 84 | Ht 65.0 in | Wt 147.0 lb

## 2024-04-13 DIAGNOSIS — M797 Fibromyalgia: Secondary | ICD-10-CM | POA: Diagnosis present

## 2024-04-13 DIAGNOSIS — I2510 Atherosclerotic heart disease of native coronary artery without angina pectoris: Secondary | ICD-10-CM | POA: Insufficient documentation

## 2024-04-13 DIAGNOSIS — R5383 Other fatigue: Secondary | ICD-10-CM | POA: Diagnosis not present

## 2024-04-13 DIAGNOSIS — R000 Tachycardia, unspecified: Secondary | ICD-10-CM | POA: Diagnosis present

## 2024-04-13 NOTE — Progress Notes (Signed)
 Cardiology Office Note:  .    Date:  04/13/2024  ID:  Amanda Waters, DOB 1956/04/09, MRN [id number] PCP: Amanda Lenis, MD  [facility] Providers Cardiologist:  Amanda DELENA Leavens, MD Cardiology APP:  Amanda Aline BRAVO, PA-C     CC: Tachycardia follow up  History of Present Illness: .    Amanda Waters is a 68 y.o. female with prior evaluation by Amanda Waters.  CSA, exercise induced tachycardia.  Asked for potential stress testing (Dr. Pura).  [name] is a 68 yo F with exercise-induced tachycardia who presents for follow-up regarding her cardiovascular health.  She experiences exercise-induced tachycardia, particularly during dance classes, with her heart rate reaching up to 152 bpm. She is on diltiazem , which helps lower her heart rate, but it sometimes drops to 47-55 bpm. She is concerned about the variability in her heart rate, especially during physical activity.  She has a history of diverticulitis, with a significant flare-up since November, leading to reduced food intake and weight loss. She previously experienced weight gain, which she attributes to her blood pressure medication affecting her metabolism and appetite.  She is on a medication referred to as 'Tempe,' which is not a statin, and reports no adverse side effects. Her lipid panel and HALT panel show improved cholesterol levels.  A past nuclear stress test indicated a possible heart attack. A CT scan showed some blockages, but blood flow was adequate. She is concerned about the implications of these findings.  She experiences symptoms such as sweating feet, cold feet, and swollen ankles, which she wonders might be side effects of her current medications.  She recently underwent a sleep study and was set up with a CPAP machine. However, she wakes up with severe migraines after using the machine, which she finds debilitating.  Discussed the use of AI scribe software for clinical note transcription  with the patient, who gave verbal consent to proceed.   Relevant histories: .  Social  - former Amanda Waters patient, comes in with her husband, Francis (both visits) - Tobacco: Never smoker - The patient participates in a study with Amanda Waters and the [employer]- she brings labs from there today ROS: As per HPI.  Studies Reviewed: .     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 The study is low risk.   No ST deviation was noted.   LV perfusion is abnormal. There is no evidence of ischemia. There is evidence of infarction. Defect 1: There is a small defect with moderate reduction in uptake present in the apical inferior location(s) that is fixed. There is normal wall motion in the defect area. Consistent with infarction.   Left ventricular function is normal. Nuclear stress EF: 73%. The left ventricular ejection fraction is hyperdynamic (>65%). End diastolic cavity size is normal. End systolic cavity size is normal.   CT images were obtained for attenuation correction and were examined for the presence of coronary calcium when appropriate.   Coronary calcium was present on the attenuation correction CT images. Moderate coronary calcifications were present. Coronary calcifications were present in the left anterior descending artery distribution(s).   ECHOCARDIOGRAM  ECHOCARDIOGRAM COMPLETE 12/21/2023  Narrative ECHOCARDIOGRAM REPORT    Patient Name:   Amanda Waters Date of Exam: 12/21/2023 Medical Rec #:  [id number]       Height:  65.0 in Accession #:    [id number]      Weight:       143.8 lb Date of Birth:  1955/12/15      BSA:          1.719 m Patient Age:    67 years        BP:           115/78 mmHg Patient Gender: F               HR:            70 bpm. Exam Location:  Inpatient  Procedure: 2D Echo, Cardiac Doppler and Color Doppler (Both Spectral and Color Flow Doppler were utilized during procedure).  Indications:    Chest Pain  History:        Patient has prior history of Echocardiogram examinations, most recent 07/30/2019. Risk Factors:Dyslipidemia and Sleep Apnea.  Sonographer:    Amanda Waters Referring Phys: [id number] Amanda Waters  IMPRESSIONS   1. Left ventricular ejection fraction, by estimation, is 60 to 65%. The left ventricle has normal function. The left ventricle has no regional wall motion abnormalities. Left ventricular diastolic parameters are consistent with Grade I diastolic dysfunction (impaired relaxation). 2. Right ventricular systolic function is normal. The right ventricular size is normal. 3. The mitral valve is normal in structure. No evidence of mitral valve regurgitation. No evidence of mitral stenosis. 4. The aortic valve is normal in structure. Aortic valve regurgitation is not visualized. No aortic stenosis is present. 5. The inferior vena cava is normal in size with greater than 50% respiratory variability, suggesting right atrial pressure of 3 mmHg.  Comparison(s): No significant change from prior study.  FINDINGS Left Ventricle: Left ventricular ejection fraction, by estimation, is 60 to 65%. The left ventricle has normal function. The left ventricle has no regional wall motion abnormalities. Strain was performed and the global longitudinal strain is indeterminate. The left ventricular internal cavity size was normal in size. There is no left ventricular hypertrophy. Left ventricular diastolic parameters are consistent with Grade I diastolic dysfunction (impaired relaxation). Normal left ventricular filling pressure.  Right Ventricle: The right ventricular size is normal. No increase in right ventricular wall thickness. Right ventricular systolic function is normal.  Left Atrium: Left atrial  size was normal in size.  Right Atrium: Right atrial size was normal in size.  Pericardium: There is no evidence of pericardial effusion.  Mitral Valve: The mitral valve is normal in structure. No evidence of mitral valve regurgitation. No evidence of mitral valve stenosis.  Tricuspid Valve: The tricuspid valve is normal in structure. Tricuspid valve regurgitation is not demonstrated. No evidence of tricuspid stenosis.  Aortic Valve: The aortic valve is normal in structure. Aortic valve regurgitation is not visualized. No aortic stenosis is present. Aortic valve mean gradient measures 4.0 mmHg. Aortic valve peak gradient measures 8.0 mmHg. Aortic valve area, by VTI measures 2.78 cm.  Pulmonic Valve: The pulmonic valve was normal in structure. Pulmonic valve regurgitation is not visualized. No evidence of pulmonic stenosis.  Aorta: The aortic root is normal in size and structure.  Venous: The inferior vena cava is normal in size with greater than 50% respiratory variability, suggesting right atrial pressure of 3 mmHg.  IAS/Shunts: No atrial level shunt detected by color flow Doppler.  Additional Comments: 3D was performed not requiring image post processing on an independent workstation and was indeterminate.   LEFT VENTRICLE PLAX 2D LVIDd:  3.60 cm     Diastology LVIDs:         2.30 cm     LV e' medial:    7.18 cm/s LV PW:         1.10 cm     LV E/e' medial:  9.9 LV IVS:        0.90 cm     LV e' lateral:   11.30 cm/s LVOT diam:     2.10 cm     LV E/e' lateral: 6.3 LV SV:         75 LV SV Index:   44 LVOT Area:     3.46 cm  LV Volumes (MOD) LV vol d, MOD A2C: 51.7 ml LV vol d, MOD A4C: 74.5 ml LV vol s, MOD A2C: 18.6 ml LV vol s, MOD A4C: 28.1 ml LV SV MOD A2C:     33.1 ml LV SV MOD A4C:     74.5 ml LV SV MOD BP:      40.4 ml  RIGHT VENTRICLE             IVC RV Basal diam:  2.40 cm     IVC diam: 1.60 cm RV S prime:     11.00 cm/s TAPSE (M-mode): 2.2 cm  LEFT  ATRIUM             Index LA diam:        2.70 cm 1.57 cm/m LA Vol (A2C):   23.1 ml 13.44 ml/m LA Vol (A4C):   39.3 ml 22.86 ml/m LA Biplane Vol: 31.2 ml 18.15 ml/m AORTIC VALVE AV Area (Vmax):    2.55 cm AV Area (Vmean):   2.54 cm AV Area (VTI):     2.78 cm AV Vmax:           141.00 cm/s AV Vmean:          92.700 cm/s AV VTI:            0.270 m AV Peak Grad:      8.0 mmHg AV Mean Grad:      4.0 mmHg LVOT Vmax:         104.00 cm/s LVOT Vmean:        68.100 cm/s LVOT VTI:          0.217 m LVOT/AV VTI ratio: 0.80  AORTA Ao Root diam: 3.00 cm Ao Asc diam:  2.60 cm  MITRAL VALVE MV Area (PHT): 3.45 cm    SHUNTS MV Decel Time: 220 msec    Systemic VTI:  0.22 m MV E velocity: 71.30 cm/s  Systemic Diam: 2.10 cm MV A velocity: 93.30 cm/s MV E/A ratio:  0.76  Vishnu Priya Mallipeddi Electronically signed by Diannah Late Mallipeddi Signature Date/Time: 12/21/2023/3:49:48 PM    Final    MONITORS  LONG TERM MONITOR (3-14 DAYS) 12/30/2023  Narrative   Patient had a minimum heart rate of 43 bpm, maximum heart rate of 159 bpm, and average heart rate of 77 bpm. Predominant underlying rhythm was sinus rhythm. One run of SVT lasting 11 beats. Isolated PACs were occasional (2.5%). Isolated PVCs were rare (<1.0%). Triggered and diary events associated with sinus rhythm and PACs.  Occasional, symptomatic PACs.   CT SCANS  CT CORONARY MORPH W/CTA COR W/SCORE 12/22/2023  Narrative EXAM: CTA HEART SUPPLEMENTAL READ 12/22/2023 11:44:53 AM  TECHNIQUE: CT of the heart was obtained with intravenous contrast. Calcium scoring analysis will be performed by the cardiologist. Automated exposure control, iterative reconstruction,  and/or weight based adjustment of the mA/kV was utilized to reduce the radiation dose to as low as reasonably achievable.  COMPARISON: None available.  CLINICAL HISTORY: Chest pain/anginal equiv, ECGs and troponins normal. Cardiology to read;  GRA overread; 95ml omni 350.  FINDINGS:  EXTRACARDIAC STRUCTURES: No mediastinal or hilar lymphadenopathy. No pericardial effusion. No cardiomegaly. No acute process in the lungs. No noncalcified nodules are noted in the lungs. Limited images of the upper abdomen are grossly unremarkable. Visualized osseous structures demonstrate no acute abnormality.   NOTE: No incidental clinically important extracardiac CT findings.  Electronically signed by: Norman Gatlin MD 12/23/2023 03:00 AM EDT RP Workstation: [id number]     ______________________________________________________________________________________________        Physical Exam:    VS:  BP 116/60   Pulse 84   Ht 5' 5 (1.651 m)   Wt 147 lb (66.7 kg)   SpO2 98%   BMI 24.46 kg/m    Wt Readings from Last 3 Encounters:  04/13/24 147 lb (66.7 kg)  03/24/24 150 lb (68 kg)  02/24/24 143 lb (64.9 kg)    Gen: No distress   Neck: No JVD Cardiac: No Rubs or Gallops, no murmur, RRR +2 radial pulses Respiratory: Clear to auscultation bilaterally, normal effort, normal  respiratory rate GI: Soft, nontender, non-distended  MS: No edema;  moves all extremities Integument: Skin feels warm Neuro:  At time of evaluation, alert and oriented to person/place/time/situation  Psych: Slightly anxious affect, patient feels ok   ASSESSMENT AND PLAN: .    Exercise-induced tachycardia with intermittent bradycardia Exercise-induced tachycardia with episodes of heart rate reaching 152 bpm during dance class. Intermittent bradycardia with heart rate dropping to 47-50 bpm. No evidence of arrhythmia or structural heart disease. Likely responsive to medication. Diltiazem  is effective in managing symptoms, with better tolerance of bradycardia than tachycardia. - Continue diltiazem  therapy at current doses; given her BB intolerance.  Rare asymptomatic SVT; we do not have evidence or elevated heart rates are related to cardiac pathology, despite  aggressive work up  Coronary artery disease with aortic atherosclerosis Coronary artery disease with mild blockages and aortic atherosclerosis. CT scan shows good blood flow despite blockages. Stress test indicated possible blockage at distal tip, but CT shows no significant obstruction. No evidence of myocardial infarction. Aortic atherosclerosis necessitates aggressive prevention strategies.  Hyperlipidemia in the context of statin intolerance and ezetimibe  therapy Hyperlipidemia managed with ezetimibe  due to statin intolerance. LDL levels improved but not at target (<70 mg/dL). Current LDL is 107-126 mg/dL. Statin therapy deferred due to ongoing diverticulitis and potential side effects. Plan to reassess lipid levels and consider statin therapy if diverticulitis improves. - Will reassess lipid levels at next annual visit with Doctor Amanda (February). - Will consider low-dose statin therapy if diverticulitis improves and lipid levels remain above target (rosuvastatin 5 mg).  Peripheral edema and weight changes associated with calcium channel blocker therapy Peripheral edema and weight gain associated with calcium channel blocker therapy. Previous trial of metoprolol  resulted in bradycardia and was discontinued. Current therapy with diltiazem  is well-tolerated despite side effects. - Continue current calcium channel blocker therapy.  2026 f/u  Longitudinal care: The evaluation and management services provided today reflect the complexity inherent in caring for this patient, including the ongoing longitudinal relationship and management of multiple chronic conditions and/or the need for care coordination. The visit required a comprehensive assessment and management plan tailored to the patient's unique needs Time was spent addressing not only the acute concerns but also  the broader context of the patient's health, including preventive care, chronic disease management, and care coordination as  appropriate.  Complex longitudinal is necessary for conditions including: complex tachycardia with multiple drug AES, she is very active and still with elevated LDL; balancing her symptoms and medication therapy  with goal to minimize severe sx   Amanda Leavens, MD FASE [facility] Cardiologist [facility]  [address], [address] [place], [place] [address] [phone]  1:01 PM

## 2024-04-13 NOTE — Patient Instructions (Signed)
 Medication Instructions:  NO CHANGES *If you need a refill on your cardiac medications before your next appointment, please call your pharmacy*  Follow-Up: At [facility], you and your health needs are our priority.  As part of our continuing mission to provide you with exceptional heart care, our providers are all part of one team.  This team includes your primary Cardiologist (physician) and Advanced Practice Providers or APPs (Physician Assistants and Nurse Practitioners) who all work together to provide you with the care you need, when you need it.  Your next appointment:    12 months with Dr. Santo of [facility]  Schedule appointment with Dr. Shlomo for sleep apnea  We recommend signing up for the patient portal called MyChart.  Sign up information is provided on this After Visit Summary.  MyChart is used to connect with patients for Virtual Visits (Telemedicine).  Patients are able to view lab/test results, encounter notes, upcoming appointments, etc.  Non-urgent messages can be sent to your provider as well.   To learn more about what you can do with MyChart, go to [email or web address].   Other Instructions

## 2024-04-14 ENCOUNTER — Ambulatory Visit: Payer: Self-pay | Admitting: *Deleted

## 2024-04-19 ENCOUNTER — Encounter: Payer: Self-pay | Admitting: Internal Medicine

## 2024-04-19 ENCOUNTER — Telehealth: Payer: Self-pay | Admitting: *Deleted

## 2024-04-19 DIAGNOSIS — I2510 Atherosclerotic heart disease of native coronary artery without angina pectoris: Secondary | ICD-10-CM

## 2024-04-19 DIAGNOSIS — R002 Palpitations: Secondary | ICD-10-CM

## 2024-04-19 DIAGNOSIS — G4730 Sleep apnea, unspecified: Secondary | ICD-10-CM

## 2024-04-19 DIAGNOSIS — R5383 Other fatigue: Secondary | ICD-10-CM

## 2024-04-19 NOTE — Telephone Encounter (Signed)
 Order for under the nose fullface mask sent to [facility] today.

## 2024-04-20 ENCOUNTER — Telehealth: Payer: Self-pay | Admitting: Student

## 2024-04-20 MED ORDER — DILTIAZEM HCL ER 60 MG PO CP12: 60.0000 mg | ORAL_CAPSULE | Freq: Two times a day (BID) | ORAL | 2 refills | Status: DC

## 2024-04-20 NOTE — Telephone Encounter (Signed)
 1. Which medications need to be refilled? (please list name of each medication and dose if known)   diltiazem  (CARDIZEM  SR) 60 MG 12 hr capsule    2. Would you like to learn more about the convenience, safety, & potential cost savings by using the [facility]? no     3. Are you open to using the [facility] (Type [facility]. No      4. Which pharmacy/location (including street and city if local pharmacy) is medication to be sent to?   [id number] [place], [place] - [address] AT [facility] [address]     5. Do they need a 30 day or 90 day supply? 90 day   Will run out tomorrow.

## 2024-04-21 NOTE — Telephone Encounter (Signed)
 Disp Refills Start End   diltiazem  (CARDIZEM  SR) 60 MG 12 hr capsule 180 capsule 2 04/20/2024 --   Sig - Route: Take 1 capsule (60 mg total) by mouth 2 (two) times daily. - Oral   Sent to pharmacy as: diltiazem  (CARDIZEM  SR) 60 MG 12 hr capsule   E-Prescribing Status: Receipt confirmed by pharmacy (04/20/2024  2:32 PM EST)

## 2024-04-21 NOTE — Telephone Encounter (Signed)
 Refill was sent on 04/20/24.

## 2024-04-21 NOTE — Telephone Encounter (Signed)
 Patient is out of medication, her pharmacy states that they have still not received the refill. Please advise.

## 2024-04-22 MED ORDER — DILTIAZEM HCL ER 60 MG PO CP12: 60.0000 mg | ORAL_CAPSULE | Freq: Two times a day (BID) | ORAL | 2 refills | Status: AC

## 2024-04-22 NOTE — Addendum Note (Signed)
 Addended by: DRENA MARTINIS, Jandi Swiger L on: 04/22/2024 09:45 AM   Modules accepted: Orders

## 2024-05-06 ENCOUNTER — Telehealth: Payer: Self-pay | Admitting: *Deleted

## 2024-05-06 DIAGNOSIS — G4730 Sleep apnea, unspecified: Secondary | ICD-10-CM

## 2024-05-06 DIAGNOSIS — R5383 Other fatigue: Secondary | ICD-10-CM

## 2024-05-06 DIAGNOSIS — I2510 Atherosclerotic heart disease of native coronary artery without angina pectoris: Secondary | ICD-10-CM

## 2024-05-06 NOTE — Telephone Encounter (Signed)
 Per Dr Shlomo, She may end up needing BiPAP S/T but will start with repeat CPAP titration with an under the nose FFM    Titration ordered

## 2024-05-06 NOTE — Telephone Encounter (Signed)
 Prior Authorization for Repeat cpap titration to be sent after patient sees Dr Shlomo in office.

## 2024-05-16 ENCOUNTER — Other Ambulatory Visit: Payer: Self-pay | Admitting: Physician Assistant

## 2024-05-17 ENCOUNTER — Ambulatory Visit (HOSPITAL_BASED_OUTPATIENT_CLINIC_OR_DEPARTMENT_OTHER): Admitting: Cardiology

## 2024-05-27 NOTE — Progress Notes (Signed)
 "  Chief Complaint: Diverticulitis  HPI:    Amanda Waters is a 69 year old female, known to Dr. Albertus, with a past medical history as listed below including fibromyalgia, IBS, reflux and multiple others, who was referred to me by Pura Lenis, MD for a complaint of diverticulitis.       11/13/2017 colonoscopy with three 6-7 mm polyps in the descending and transverse colon as well as hepatic flexure, mild diverticulosis in left colon otherwise normal.  Pathology showed sessile serrated polyps without dysplasia and repeat was recommended in 5 years.  EGD on the same day with a small hiatal hernia and multiple gastric polyps and nonbleeding erosive gastropathy.  Labs returned grossly normal.  Hemoccult studies were negative.  She never had her CT done.    10/18/2020 patient seen in clinic for right lower quadrant pain.  At that time repeated CBC, iron studies and B12 and ordered Hemoccult testing.  Also ordered noncontrast CT of the abdomen pelvis for further evaluation.    11/08/2021 ER visit for right lower quadrant pain.  At that time discussed abdominal pain which is started 5 days before and felt like cramping worse with urination.  Labs showed minimal elevation in LFTs AST 48 and ALT 53, creatinine minimally elevated at 1.02.  Urinalysis normal CBC normal.  CT of the abdomen pelvis with diverticulosis and otherwise normal.    12/12/2021 office with me to discuss episodes of right lower quadrant pain.  She remained on Pantoprazole  40 mg daily which controlled her reflux.  Patient was scheduled for diagnostic colonoscopy.  Provided a copy low FODMAP diet and refill Pantoprazole  40 daily.    01/16/2022 colonoscopy with two 7 mm polyps in the sigmoid colon and transverse colon, lipoma in the transverse colon, mild diverticulosis in the left colon internal hemorrhoids.  Patient told to continue Dicyclomine  10 mg p.o. before meals and at bedtime.  Pathology showed polypoid mucosa.  Repeat colonoscopy recommended in 7  years.    10/15/2022 CT abdomen pelvis with IV contrast showed descending colonic acute diverticulitis.    04/04/2023 CBC normal.    05/29/2023 patient went and saw PCP and diagnosed with diverticulitis.  She was given Augmentin for 10 days.    07/23/2023 office visit with me and discussed 2 flares of diverticulitis over the past year.  At that time discussed increased fiber in between flares and MiraLAX to help have regular bowel movements.  Prescribed some Hyoscyamine  sulfate for occasional left lower quadrant discomfort.    03/25/2024 CBC and hepatic function panel normal.    03/31/24 Ct A/P with contrast with suspected minimal focus of acute diverticulitis in the mid descending colon, suggestion of wall thickening throughout the transverse colon favored related to underdistention.  Discussed the use of AI scribe software for clinical note transcription with the patient, who gave verbal consent to proceed.  History of Present Illness   Since March 2025, she has experienced five episodes of diverticulitis, with documented flares at the end of May, November 6th, November 20th, and December 3rd, 2025. Each episode is characterized by lower abdominal pain. Flares are associated with significant fatigue and difficulty maintaining electrolyte balance.. These episodes are described as exhausting and have led to feelings of depression, particularly during prolonged flares around Thanksgiving.  Hyoscyamine  is used only during episodes and is reported as very helpful, with three or four prescriptions remaining. Each episode has been treated with a full ten-day course of Augmentin. No hospitalizations or IV antibiotics have been required. Daily  Miralax is continued unless diarrhea develops, at which point it is held for a day. She expresses concern about the risk of rupture and the impact of her condition on quality of life.  Dietary restrictions include limited dairy intake with use of Lactaid as needed. She has  attempted a FODMAP diet and remains gluten-free, which she feels has helped, though digestive issues predated this change. Reintroduction of fiber is challenging; dark leafy greens are tolerated, and she has only recently resumed eating salad after several months. She wonders about food allergies but has not identified specific triggers.  Refills for Protonix  and hyoscyamine  are requested. She is currently adapting to CPAP therapy for central sleep apnea, initiated around [facility] 2025, and feels it is helping her energy, though sleep remains suboptimal.  Additional history includes a nuclear stress test revealed a prior myocardial infarction, and subsequent cardiac medications caused symptoms of indigestion, chest pressure, arm pain, and jaw pain. Coronary artery disease has been added to her medical history. She describes 2025 as a difficult year and expresses hope for improved health in 2026.  Denies fever, chills, weight loss, nausea or vomiting.  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    Hyperlipidemia    IBS (irritable bowel syndrome)    Infection due to non-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-producing Escherichia coli (E.coli)    Iron deficiency anemia    Kidney infection    Kidney stones    Left radial fracture    Radial head fracture   Memory difficulty 12/16/2013   Occipital neuralgia    Osteopenia    Sleep apnea    Swollen lymph nodes     Past Surgical History:  Procedure Laterality Date   Broken Elbow Bilateral    CHOLECYSTECTOMY     COLONOSCOPY     CYSTECTOMY     DILATION AND CURETTAGE OF UTERUS     hammartoe surgery  Left    2nd toe   POLYPECTOMY     SHOULDER SURGERY     TONSILLECTOMY      Current Outpatient Medications  Medication Sig Dispense Refill   aspirin  EC 81 MG tablet Take 1 tablet (81 mg total) by mouth daily. Swallow whole.     Bacillus Coagulans-Inulin (ALIGN PREBIOTIC-PROBIOTIC PO) Take 1 capsule by mouth at bedtime.     Calcium Carbonate-Vitamin D 600-400 MG-UNIT chew tablet Chew 1 tablet by mouth daily.     Cholecalciferol (WELL VITAMIN D3) 50 MCG (2000 UT) CAPS Take 2,000 Units by mouth daily.     diltiazem  (CARDIZEM  SR) 60 MG 12 hr capsule Take 1 capsule (60 mg total) by mouth 2 (two) times daily. 180 capsule 2   ezetimibe  (ZETIA ) 10 MG tablet Take 1 tablet (10 mg total) by mouth daily. 90 tablet 3   famciclovir (FAMVIR) 500 MG tablet Take 500 mg by mouth daily.     famotidine  (PEPCID ) 20 MG tablet Take 20 mg by mouth at bedtime.     gabapentin  (NEURONTIN ) 600 MG tablet Take 1 tablet (600 mg total) by mouth 3 (three) times daily. 270  tablet 1   hyoscyamine  (LEVSIN  SL) 0.125 MG SL tablet Place under the tongue every 4 (four) hours as needed for cramping.     Melatonin 10 MG CHEW Chew 10 mg by mouth at bedtime.     Multiple Vitamins-Minerals (CENTRUM SILVER ADULT 50+ PO) Take 1 tablet by mouth daily.     nitroGLYCERIN  (NITROSTAT ) 0.4 MG SL tablet Place 1 tablet (0.4 mg total) under the tongue every 5 (five) minutes as needed for chest pain. 25 tablet 3   Nutritional Supplements (DHEA PO) Take 5 mg by mouth daily.     Omega-3 Fatty Acids (FISH OIL) 1000 MG CAPS Take 1,000 mg by mouth every other day.     pantoprazole  (PROTONIX ) 40 MG tablet TAKE ONE TABLET BY MOUTH ONE TIME DAILY 90 tablet 1   polyethylene glycol powder (GLYCOLAX/MIRALAX) 17 GM/SCOOP powder Take 17 g by mouth daily.     pyridOXINE (VITAMIN B6) 25 MG tablet Take 25 mg by mouth daily.     vitamin E 400 UNIT capsule Take 400 Units by mouth daily.     zolpidem  (AMBIEN ) 5 MG tablet Take 1 tablet (5 mg total) by mouth at  bedtime as needed. for sleep (Patient taking differently: Take 1.25 mg by mouth at bedtime as needed for sleep. for sleep) 30 tablet 3   No current facility-administered medications for this visit.    Allergies as of 05/28/2024 - Review Complete 04/13/2024  Allergen Reaction Noted   Valtrex  [valacyclovir ] Nausea And Vomiting 12/22/2023   Celebrex [celecoxib] Hives 11/24/2011   Darvon [propoxyphene]  11/24/2011   Duract [bromfenac] Itching 11/24/2011   Nitrofuran derivatives  05/16/2017   Oxycodone-acetaminophen  Itching 11/11/2013   Prednisone  11/24/2011   Sulfa antibiotics Hives 12/19/2014    Family History  Problem Relation Age of Onset   Fibromyalgia Mother    Colon polyps Mother    Diabetes Father    Thyroid cancer Father    Heart disease Father    Dementia Father    Colon polyps Father    Colon cancer Neg Hx    Esophageal cancer Neg Hx    Liver cancer Neg Hx    Pancreatic cancer Neg Hx    Stomach cancer Neg Hx    Rectal cancer Neg Hx     Social History   Socioeconomic History   Marital status: Married    Spouse name: Not on file   Number of children: 0   Years of education: 16   Highest education level: Not on file  Occupational History   Occupation: Network Engineer    Comment: part time  Tobacco Use   Smoking status: Never   Smokeless tobacco: Never  Substance and Sexual Activity   Alcohol use: Yes    Comment: Consumes alcohol on occasion   Drug use: No   Sexual activity: Not on file  Other Topics Concern   Not on file  Social History Narrative   Lives at home w/ her husband and dog   Patient drinks 1 cup of tea a day.    Patient is right handed.   Social Drivers of Health   Tobacco Use: Low Risk (03/26/2024)   Received from [facility]   Patient History    Passive Exposure: Never    Smokeless Tobacco Use: Never    Smoking Tobacco Use: Never  Financial Resource Strain: Patient Declined (03/21/2024)   Received from [facility]   Overall  Financial Resource Strain (CARDIA)    How hard is  it for you to pay for the very basics like food, housing, medical care, and heating?: Patient declined  Food Insecurity: No Food Insecurity (03/21/2024)   Received [facility]   Epic    Within the past 12 months, you worried that your food would run out before you got the money to buy more.: Never true    Within the past 12 months, the food you bought just didn't last and you didn't have money to get more.: Never true  Transportation Needs: No Transportation Needs (03/21/2024)   Received from [facility]    In the past 12 months, has lack of transportation kept you from medical appointments or from getting medications?: No    In the past 12 months, has lack of transportation kept you from meetings, work, or from getting things needed for daily living?: No  Physical Activity: Sufficiently Active (03/21/2024)   Received from [facility]   Exercise Vital Sign    On average, how many days per week do you engage in moderate to strenuous exercise (like a brisk walk)?: 3 days    On average, how many minutes do you engage in exercise at this level?: 50 min  Stress: No Stress Concern Present (03/21/2024)   Received from [facility] - Occupational Stress Questionnaire    Do you feel stress - tense, restless, nervous, or anxious, or unable to sleep at night because your mind is troubled all the time - these days?: Only a little  Social Connections: Socially Integrated (03/21/2024)   Received from [facility]   Social Network    How would you rate your social network (family, work, friends)?: Good participation with social networks  Intimate Partner Violence: Not At Risk (03/21/2024)   Received from [facility]   HITS    Over the last 12 months how often did your partner physically hurt you?: Never    Over the last 12 months how often did your partner insult you or talk down to you?: Never     Over the last 12 months how often did your partner threaten you with physical harm?: Never    Over the last 12 months how often did your partner scream or curse at you?: Never  Depression (PHQ2-9): Not on file  Alcohol Screen: Not on file  Housing: Low Risk (03/21/2024)   Received from [facility]    In the last 12 months, was there a time when you were not able to pay the mortgage or rent on time?: No    In the past 12 months, how many times have you moved where you were living?: 0    At any time in the past 12 months, were you homeless or living in a shelter (including now)?: No  Utilities: Not At Risk (03/21/2024)   Received from [facility]    In the past 12 months has the electric, gas, oil, or water company threatened to shut off services in your home?: No  Health Literacy: Not on file    Review of Systems:    Constitutional: No weight loss, fever or chills Cardiovascular: No chest pain Respiratory: No SOB  Gastrointestinal: See HPI and otherwise negative   Physical Exam:  Vital signs: BP 122/66   Pulse 76   Ht 5' 5 (1.651 m)   Wt 150 lb 2 oz (68.1 kg)   BMI 24.98 kg/m    Constitutional:  Pleasant Caucasian female appears to be in NAD, Well developed, Well nourished, alert and cooperative Respiratory: Respirations even and unlabored. Lungs clear to auscultation bilaterally.   No wheezes, crackles, or rhonchi.  Cardiovascular: Normal S1, S2. No MRG. Regular rate and rhythm. No peripheral edema, cyanosis or pallor.  Gastrointestinal:  Soft, nondistended, nontender. No rebound or guarding. Normal bowel sounds. No appreciable masses or hepatomegaly. Rectal:  Not performed.  Psychiatric: Demonstrates good judgement and reason without abnormal affect or behaviors.  MOST recent labs and imaging and see HPI: CBC    Component Value Date/Time   WBC 7.3 03/25/2024 1007   WBC 6.0 12/20/2023 0330   RBC 4.65 03/25/2024 1007   RBC 4.46 12/20/2023 0330   HGB  12.7 03/25/2024 1007   HCT 39.2 03/25/2024 1007   PLT 328 03/25/2024 1007   MCV 84 03/25/2024 1007   MCH 27.3 03/25/2024 1007   MCH 26.7 12/20/2023 0330   MCHC 32.4 03/25/2024 1007   MCHC 32.7 12/20/2023 0330   RDW 13.0 03/25/2024 1007   LYMPHSABS 1.7 11/08/2021 1514   MONOABS 0.4 11/08/2021 1514   EOSABS 0.1 11/08/2021 1514   BASOSABS 0.0 11/08/2021 1514    CMP     Component Value Date/Time   NA 137 12/20/2023 0330   NA 144 09/12/2020 1051   K 4.2 12/20/2023 0330   CL 104 12/20/2023 0330   CO2 24 12/20/2023 0330   GLUCOSE 108 (H) 12/20/2023 0330   BUN 13 12/20/2023 0330   BUN 11 09/12/2020 1051   CREATININE 0.98 12/20/2023 0330   CALCIUM 9.1 12/20/2023 0330   PROT 7.0 03/25/2024 1007   ALBUMIN 4.8 03/25/2024 1007   AST 24 03/25/2024 1007   ALT 24 03/25/2024 1007   ALKPHOS 50 03/25/2024 1007   BILITOT 0.9 03/25/2024 1007   GFRNONAA >60 12/20/2023 0330   GFRAA >60 05/16/2017 0946    Assessment & Plan Recurrent diverticulitis of the colon She is currently asymptomatic but has experienced five episodes in the past year, with increasing frequency and significant impact on her quality of life. She is concerned about complications such as perforation and is receptive to preventive strategies, including surgical consultation, despite anxiety regarding surgery. - Referred to colorectal surgery for evaluation and discussion of elective segmental colectomy to reduce recurrence risk. - Educated regarding surgical options, including the possibility of a one-step procedure if performed when not acutely inflamed, and the potential need for temporary colostomy if surgery is performed during acute inflammation. - Provided dietary guidance to maintain a clear liquid diet and use polyethylene glycol (Miralax) during symptomatic episodes. - Advised initiation of antibiotics if symptoms do not improve within 2-3 days of conservative management. - Discussed gradual reintroduction of dietary  fiber after symptom resolution, targeting 25-35 grams per day as tolerated. - Reassured that food allergies and specific foods (e.g., nuts, seeds) are not typically causative for recurrences and that diverticulitis does not progress to Crohn's disease or other inflammatory bowel diseases. - Refilled hyoscyamine  for symptomatic management of episodes.  Chronic constipation She continues to experience chronic constipation, managed with daily polyethylene glycol and gradual dietary fiber increase, with attention to bowel regularity to help prevent diverticulitis recurrences. - Advised continuation of daily polyethylene glycol, holding on days with diarrhea. - Reinforced gradual increase in dietary fiber after resolution of diverticulitis symptoms. - Provided anticipatory guidance on monitoring bowel habits and adjusting therapy as needed.  Gastroesophageal reflux disease - Refilled pantoprazole  (Protonix ).  Patient to follow in clinic with us   in a year for refills or as needed in between.   Delon Failing, PA-C [facility] 05/27/2024, 9:20 AM  Cc: Pura Lenis, MD  "

## 2024-05-28 ENCOUNTER — Encounter: Payer: Self-pay | Admitting: Physician Assistant

## 2024-05-28 ENCOUNTER — Ambulatory Visit: Admitting: Physician Assistant

## 2024-05-28 VITALS — BP 122/66 | HR 76 | Ht 65.0 in | Wt 150.1 lb

## 2024-05-28 DIAGNOSIS — K219 Gastro-esophageal reflux disease without esophagitis: Secondary | ICD-10-CM | POA: Diagnosis not present

## 2024-05-28 DIAGNOSIS — K5732 Diverticulitis of large intestine without perforation or abscess without bleeding: Secondary | ICD-10-CM | POA: Diagnosis not present

## 2024-05-28 DIAGNOSIS — K5792 Diverticulitis of intestine, part unspecified, without perforation or abscess without bleeding: Secondary | ICD-10-CM

## 2024-05-28 DIAGNOSIS — K589 Irritable bowel syndrome without diarrhea: Secondary | ICD-10-CM

## 2024-05-28 DIAGNOSIS — K5909 Other constipation: Secondary | ICD-10-CM | POA: Diagnosis not present

## 2024-05-28 MED ORDER — HYOSCYAMINE SULFATE 0.125 MG SL SUBL: 0.1250 mg | SUBLINGUAL_TABLET | SUBLINGUAL | 2 refills | Status: AC | PRN

## 2024-05-28 MED ORDER — PANTOPRAZOLE SODIUM 40 MG PO TBEC: 40.0000 mg | DELAYED_RELEASE_TABLET | Freq: Every day | ORAL | 1 refills | Status: AC

## 2024-05-28 NOTE — Patient Instructions (Signed)
 We have sent the following medications to your pharmacy for you to pick up at your convenience:  Levsin , Protonix    We are referring you to [facility] - Dr Leonor Dawn.  They will contact you directly to schedule an appointment.  It may take a week or more before you hear from them.  Please feel free to contact us  if you have not heard from them within 2 weeks and we will follow up on the referral.   _______________________________________________________  If your blood pressure at your visit was 140/90 or greater, please contact your primary care physician to follow up on this.  _______________________________________________________  If you are age 47 or older, your body mass index should be between 23-30. Your Body mass index is 24.98 kg/m. If this is out of the aforementioned range listed, please consider follow up with your Primary Care Provider.  If you are age 43 or younger, your body mass index should be between 19-25. Your Body mass index is 24.98 kg/m. If this is out of the aformentioned range listed, please consider follow up with your Primary Care Provider.   ________________________________________________________  The [facility] providers would like to encourage you to use MYCHART to communicate with providers for non-urgent requests or questions.  Due to long hold times on the telephone, sending your provider a message by [facility] may be a faster and more efficient way to get a response.  Please allow 48 business hours for a response.  Please remember that this is for non-urgent requests.  _______________________________________________________  [facility] is using a team-based approach to care.  Your team is made up of your doctor and two to three APPS. Our APPS (Nurse Practitioners and Physician Assistants) work with your physician to ensure care continuity for you. They are fully qualified to address your health concerns and develop a treatment  plan. They communicate directly with your gastroenterologist to care for you. Seeing the Advanced Practice Practitioners on your physician's team can help you by facilitating care more promptly, often allowing for earlier appointments, access to diagnostic testing, procedures, and other specialty referrals.   Thank you for choosing me and [facility].  Delon Failing, PA-C

## 2024-05-31 NOTE — Addendum Note (Signed)
 Addended by: JOSHUA BRAD MATSU on: 05/31/2024 02:54 PM   Modules accepted: Orders

## 2024-05-31 NOTE — Telephone Encounter (Signed)
 Prior Authorization for CPAP TITRATION sent to MEDICARE. NO PA REQ FOR MEDICARE.  Patient has been scheduled for 07/25/24. Patient is aware

## 2024-06-09 ENCOUNTER — Ambulatory Visit: Admitting: Orthopaedic Surgery

## 2024-06-21 ENCOUNTER — Telehealth: Payer: Self-pay

## 2024-06-21 ENCOUNTER — Encounter: Payer: Self-pay | Admitting: Cardiology

## 2024-06-21 ENCOUNTER — Ambulatory Visit: Admitting: Cardiology

## 2024-06-21 VITALS — BP 114/71 | HR 76 | Ht 65.0 in | Wt 153.7 lb

## 2024-06-21 DIAGNOSIS — G4733 Obstructive sleep apnea (adult) (pediatric): Secondary | ICD-10-CM

## 2024-06-21 NOTE — Patient Instructions (Signed)
 Medication Instructions:  Your physician recommends that you continue on your current medications as directed. Please refer to the Current Medication list given to you today.  *If you need a refill on your cardiac medications before your next appointment, please call your pharmacy*  Lab Work: None ordered If you have labs (blood work) drawn today and your tests are completely normal, you will receive your results only by: MyChart Message (if you have MyChart) OR A paper copy in the mail If you have any lab test that is abnormal or we need to change your treatment, we will call you to review the results.  Testing/Procedures: None ordered  Follow-Up: At [facility], you and your health needs are our priority.  As part of our continuing mission to provide you with exceptional heart care, our providers are all part of one team.  This team includes your primary Cardiologist (physician) and Advanced Practice Providers or APPs (Physician Assistants and Nurse Practitioners) who all work together to provide you with the care you need, when you need it.  Your next appointment:   1 year(s)  Provider:   Wilbert Bihari, MD   We recommend signing up for the patient portal called MyChart.  Sign up information is provided on this After Visit Summary.  MyChart is used to connect with patients for Virtual Visits (Telemedicine).  Patients are able to view lab/test results, encounter notes, upcoming appointments, etc.  Non-urgent messages can be sent to your provider as well.   To learn more about what you can do with MyChart, go to [email or web address].

## 2024-06-21 NOTE — Telephone Encounter (Signed)
 SABRA

## 2024-07-25 ENCOUNTER — Ambulatory Visit (HOSPITAL_BASED_OUTPATIENT_CLINIC_OR_DEPARTMENT_OTHER): Admitting: Cardiology
